# Patient Record
Sex: Male | Born: 1935 | Race: White | Hispanic: No | Marital: Single | Smoking: Current every day smoker
Health system: Southern US, Community
[De-identification: ages and names within clinical notes are randomized; demographics above are authoritative.]

## PROBLEM LIST (undated history)

## (undated) DIAGNOSIS — F0280 Dementia in other diseases classified elsewhere without behavioral disturbance: Secondary | ICD-10-CM

## (undated) DIAGNOSIS — I4891 Unspecified atrial fibrillation: Secondary | ICD-10-CM

## (undated) DIAGNOSIS — G309 Alzheimer's disease, unspecified: Secondary | ICD-10-CM

## (undated) DIAGNOSIS — B958 Unspecified staphylococcus as the cause of diseases classified elsewhere: Secondary | ICD-10-CM

## (undated) DIAGNOSIS — I10 Essential (primary) hypertension: Secondary | ICD-10-CM

## (undated) DIAGNOSIS — B029 Zoster without complications: Secondary | ICD-10-CM

## (undated) HISTORY — PX: UMBILICAL HERNIA REPAIR: SHX196

## (undated) HISTORY — DX: Essential (primary) hypertension: I10

## (undated) HISTORY — PX: INGUINAL HERNIA REPAIR: SUR1180

## (undated) HISTORY — DX: Unspecified staphylococcus as the cause of diseases classified elsewhere: B95.8

## (undated) HISTORY — DX: Zoster without complications: B02.9

## (undated) HISTORY — PX: TONSILLECTOMY: SUR1361

## (undated) HISTORY — PX: TIBIA FRACTURE SURGERY: SHX806

---

## 2005-04-21 ENCOUNTER — Ambulatory Visit: Payer: Self-pay | Admitting: Family Medicine

## 2006-05-31 ENCOUNTER — Ambulatory Visit: Payer: Self-pay | Admitting: Family Medicine

## 2007-09-19 ENCOUNTER — Encounter: Payer: Self-pay | Admitting: Cardiology

## 2007-12-16 ENCOUNTER — Encounter: Payer: Self-pay | Admitting: Cardiology

## 2008-10-08 ENCOUNTER — Encounter: Payer: Self-pay | Admitting: Cardiology

## 2008-10-09 ENCOUNTER — Encounter: Payer: Self-pay | Admitting: Cardiology

## 2008-10-14 ENCOUNTER — Encounter: Payer: Self-pay | Admitting: Cardiology

## 2008-11-26 ENCOUNTER — Encounter: Payer: Self-pay | Admitting: Cardiology

## 2008-12-11 ENCOUNTER — Ambulatory Visit: Payer: Self-pay | Admitting: Cardiology

## 2008-12-11 DIAGNOSIS — I10 Essential (primary) hypertension: Secondary | ICD-10-CM | POA: Insufficient documentation

## 2008-12-11 DIAGNOSIS — I491 Atrial premature depolarization: Secondary | ICD-10-CM

## 2008-12-11 DIAGNOSIS — R011 Cardiac murmur, unspecified: Secondary | ICD-10-CM

## 2009-01-12 ENCOUNTER — Telehealth (INDEPENDENT_AMBULATORY_CARE_PROVIDER_SITE_OTHER): Payer: Self-pay | Admitting: *Deleted

## 2011-10-07 ENCOUNTER — Encounter: Payer: Self-pay | Admitting: Cardiology

## 2012-07-11 ENCOUNTER — Encounter: Payer: Self-pay | Admitting: Cardiology

## 2012-08-23 ENCOUNTER — Encounter: Payer: Self-pay | Admitting: Cardiology

## 2012-08-23 ENCOUNTER — Ambulatory Visit (INDEPENDENT_AMBULATORY_CARE_PROVIDER_SITE_OTHER): Payer: Medicare Other | Admitting: Cardiology

## 2012-08-23 VITALS — BP 180/74 | HR 75 | Ht 67.0 in | Wt 143.0 lb

## 2012-08-23 DIAGNOSIS — I10 Essential (primary) hypertension: Secondary | ICD-10-CM

## 2012-08-23 DIAGNOSIS — R9431 Abnormal electrocardiogram [ECG] [EKG]: Secondary | ICD-10-CM | POA: Insufficient documentation

## 2012-08-23 DIAGNOSIS — I491 Atrial premature depolarization: Secondary | ICD-10-CM

## 2012-08-23 NOTE — Progress Notes (Signed)
Clinical Summary Mr. Snuffer is a 79 y.o.male referred back to the office by Ms. Hemberg NP with [facility] in [place] regarding history of atrial ectopy. He was last seen in the office back in September 2010.   He reports no specific symptoms of chest pain or shortness of breath. Has an occasional sense of dizziness and palpitations but no syncope. He is on fairly minimal medications. States that he takes "a teaspoon of vinegar" for blood pressure.  ECG from April of this year reviewed showing sinus rhythm with PACs, leftward axis rule out old inferior infarct pattern, also poor anterior R. progression suggestive of prior anterolateral infarct. Compared to old tracing from 2010, leftward axis is similar, as are PACs, however R wave progression has worsened significantly.  He has not had an echocardiogram, previously recommended to assess cardiac structure and function. We discussed this again today.  Allergies  Allergen Reactions  . Dristan Cold (Chlorphen-Pe-Acetaminophen) Nausea Only  . Sulfonamide Derivatives Nausea Only    Current Outpatient Prescriptions  Medication Sig Dispense Refill  . aspirin EC 81 MG tablet Take 81 mg by mouth daily.      Marland Kitchen acetaminophen (TYLENOL) 500 MG tablet Take 500 mg by mouth every 6 (six) hours as needed.       No current facility-administered medications for this visit.    Past Medical History  Diagnosis Date  . Essential hypertension, benign   . Herpes zoster   . Staphylococcal skin infection     Past Surgical History  Procedure Laterality Date  . Inguinal hernia repair    . Tibia fracture surgery      Open reduction and internal fixation of left tibia fracture  . Tonsillectomy    . Umbilical hernia repair      Family History  Problem Relation Age of Onset  . Cancer      Social History Mr. Blanford reports that he has been smoking Cigarettes.  He has a 31 pack-year smoking history. He has never used smokeless tobacco. Mr.  Elpers reports that  drinks alcohol.  Review of Systems No orthopnea or PND. No exertional chest pain. Stable appetite. Occasional anxious feeling. Otherwise negative.  Physical Examination Filed Vitals:   08/23/12 1458  BP: 180/74  Pulse: 75   Filed Weights   08/23/12 1458  Weight: 143 lb (64.864 kg)   No acute distress. HEENT: Conjunctiva and lids normal, oropharynx clear. Neck: Supple, no elevated JVP or carotid bruits, no thyromegaly. Lungs: Clear to auscultation, nonlabored breathing at rest. Cardiac: Regular rate and rhythm with frequent ectopic beats, no S3, soft systolic murmur, no pericardial rub. Abdomen: Soft, nontender, bowel sounds present. Extremities: No pitting edema, distal pulses 2+. Skin: Warm and dry. Musculoskeletal: No kyphosis. Neuropsychiatric: Alert and oriented x3, affect grossly appropriate.   Problem List and Plan   PREMATURE ATRIAL CONTRACTIONS Documented previously. Not entirely clear that these are overly symptomatic, although he does have occasional sense of palpitations. No syncope. No progressive symptoms. Recommended that he quit smoking, limit caffeine in his diet. He is not particularly interested in any specific medications.  Essential hypertension, benign Blood pressure elevated today, not on antihypertensives. He states that he will continue using "a teaspoon of vinegar" for his blood pressure. I recommended that he followup with his primary care provider, may need to consider additional agents.  Abnormal ECG Reviewed above. As recommended previously, an echocardiogram will be ordered to assess cardiac structure and function, exclude cardiomyopathy or other wall motion abnormalities  that might need further evaluation.    Jonelle Sidle, M.D., F.A.C.C.

## 2012-08-23 NOTE — Assessment & Plan Note (Signed)
Reviewed above. As recommended previously, an echocardiogram will be ordered to assess cardiac structure and function, exclude cardiomyopathy or other wall motion abnormalities that might need further evaluation.

## 2012-08-23 NOTE — Patient Instructions (Addendum)
Your physician has requested that you have an echocardiogram. Echocardiography is a painless test that uses sound waves to create images of your heart. It provides your doctor with information about the size and shape of your heart and how well your heart's chambers and valves are working. This procedure takes approximately one hour. There are no restrictions for this procedure. We will call you with your results.

## 2012-08-23 NOTE — Assessment & Plan Note (Signed)
Blood pressure elevated today, not on antihypertensives. He states that he will continue using "a teaspoon of vinegar" for his blood pressure. I recommended that he followup with his primary care provider, may need to consider additional agents.

## 2012-08-23 NOTE — Assessment & Plan Note (Signed)
Documented previously. Not entirely clear that these are overly symptomatic, although he does have occasional sense of palpitations. No syncope. No progressive symptoms. Recommended that he quit smoking, limit caffeine in his diet. He is not particularly interested in any specific medications.

## 2012-09-05 ENCOUNTER — Other Ambulatory Visit (INDEPENDENT_AMBULATORY_CARE_PROVIDER_SITE_OTHER): Payer: Medicare Other

## 2012-09-05 ENCOUNTER — Other Ambulatory Visit: Payer: Self-pay

## 2012-09-05 DIAGNOSIS — I10 Essential (primary) hypertension: Secondary | ICD-10-CM

## 2012-09-05 DIAGNOSIS — R9431 Abnormal electrocardiogram [ECG] [EKG]: Secondary | ICD-10-CM

## 2012-09-05 DIAGNOSIS — I491 Atrial premature depolarization: Secondary | ICD-10-CM

## 2012-09-07 ENCOUNTER — Telehealth: Payer: Self-pay | Admitting: *Deleted

## 2012-09-07 NOTE — Telephone Encounter (Signed)
Message copied by Eustace Whitner on Fri Sep 07, 2012 11:27 AM ------      Message from: MCDOWELL, Illene Bolus      Created: Wed Sep 05, 2012  3:54 PM       Reviewed. Let him know that overall LV systolic function is in the low normal range, no definite wall motion abnormalities to suggest scar, no clear evidence of cardiomyopathy. Small pericardial effusion, doubt major clinical significance. Aortic root and ascending aorta mildly enlarged. May need to reconsider medical therapy for hypertension, have him followup with his primary care provider. No other cardiac testing anticipated at this time. ------

## 2012-09-07 NOTE — Telephone Encounter (Signed)
Patient informed. Copy sent to PCP °

## 2014-03-19 ENCOUNTER — Ambulatory Visit (HOSPITAL_COMMUNITY)
Admission: RE | Admit: 2014-03-19 | Discharge: 2014-03-19 | Disposition: A | Discharge status: Home / Self Care | Payer: Medicare Other | Source: Ambulatory Visit | Attending: Adult Health Nurse Practitioner | Admitting: Adult Health Nurse Practitioner

## 2014-03-19 ENCOUNTER — Other Ambulatory Visit (HOSPITAL_COMMUNITY): Payer: Self-pay | Admitting: Adult Health Nurse Practitioner

## 2014-03-19 DIAGNOSIS — J069 Acute upper respiratory infection, unspecified: Secondary | ICD-10-CM | POA: Diagnosis present

## 2014-03-19 DIAGNOSIS — R042 Hemoptysis: Secondary | ICD-10-CM | POA: Diagnosis present

## 2014-03-19 DIAGNOSIS — F17210 Nicotine dependence, cigarettes, uncomplicated: Secondary | ICD-10-CM | POA: Diagnosis present

## 2014-03-19 DIAGNOSIS — J449 Chronic obstructive pulmonary disease, unspecified: Secondary | ICD-10-CM | POA: Insufficient documentation

## 2014-03-19 DIAGNOSIS — J189 Pneumonia, unspecified organism: Secondary | ICD-10-CM | POA: Diagnosis not present

## 2014-04-07 ENCOUNTER — Ambulatory Visit (HOSPITAL_COMMUNITY)
Admission: RE | Admit: 2014-04-07 | Discharge: 2014-04-07 | Disposition: A | Discharge status: Home / Self Care | Payer: Commercial Managed Care - HMO | Source: Ambulatory Visit | Attending: Adult Health Nurse Practitioner | Admitting: Adult Health Nurse Practitioner

## 2014-04-07 ENCOUNTER — Other Ambulatory Visit (HOSPITAL_COMMUNITY): Payer: Self-pay | Admitting: Adult Health Nurse Practitioner

## 2014-04-07 DIAGNOSIS — J189 Pneumonia, unspecified organism: Secondary | ICD-10-CM

## 2014-04-07 DIAGNOSIS — J449 Chronic obstructive pulmonary disease, unspecified: Secondary | ICD-10-CM | POA: Diagnosis not present

## 2014-04-07 DIAGNOSIS — Z09 Encounter for follow-up examination after completed treatment for conditions other than malignant neoplasm: Secondary | ICD-10-CM | POA: Diagnosis present

## 2014-04-30 ENCOUNTER — Other Ambulatory Visit (HOSPITAL_COMMUNITY): Payer: Self-pay | Admitting: Adult Health Nurse Practitioner

## 2014-04-30 ENCOUNTER — Ambulatory Visit (HOSPITAL_COMMUNITY)
Admission: RE | Admit: 2014-04-30 | Discharge: 2014-04-30 | Disposition: A | Discharge status: Home / Self Care | Payer: Commercial Managed Care - HMO | Source: Ambulatory Visit | Attending: Adult Health Nurse Practitioner | Admitting: Adult Health Nurse Practitioner

## 2014-04-30 DIAGNOSIS — J449 Chronic obstructive pulmonary disease, unspecified: Secondary | ICD-10-CM | POA: Diagnosis not present

## 2014-04-30 DIAGNOSIS — J690 Pneumonitis due to inhalation of food and vomit: Secondary | ICD-10-CM | POA: Diagnosis present

## 2014-04-30 DIAGNOSIS — J984 Other disorders of lung: Secondary | ICD-10-CM | POA: Diagnosis not present

## 2014-06-24 ENCOUNTER — Ambulatory Visit (HOSPITAL_COMMUNITY)
Admission: RE | Admit: 2014-06-24 | Discharge: 2014-06-24 | Disposition: A | Discharge status: Home / Self Care | Payer: Medicare HMO | Source: Ambulatory Visit | Attending: Adult Health Nurse Practitioner | Admitting: Adult Health Nurse Practitioner

## 2014-06-24 ENCOUNTER — Other Ambulatory Visit (HOSPITAL_COMMUNITY): Payer: Self-pay | Admitting: Adult Health Nurse Practitioner

## 2014-06-24 ENCOUNTER — Other Ambulatory Visit (HOSPITAL_COMMUNITY): Payer: Self-pay | Admitting: *Deleted

## 2014-06-24 DIAGNOSIS — J189 Pneumonia, unspecified organism: Secondary | ICD-10-CM

## 2014-06-24 DIAGNOSIS — R05 Cough: Secondary | ICD-10-CM | POA: Diagnosis present

## 2014-06-24 DIAGNOSIS — Z87891 Personal history of nicotine dependence: Secondary | ICD-10-CM | POA: Diagnosis not present

## 2016-10-16 ENCOUNTER — Encounter (HOSPITAL_COMMUNITY): Payer: Self-pay | Admitting: *Deleted

## 2016-10-16 ENCOUNTER — Inpatient Hospital Stay (HOSPITAL_COMMUNITY)
Admission: EM | Admit: 2016-10-16 | Discharge: 2016-10-26 | DRG: 871 | Disposition: A | Discharge status: Skilled Nursing Facility | Payer: Medicare HMO | Attending: Internal Medicine | Admitting: Internal Medicine

## 2016-10-16 ENCOUNTER — Emergency Department (HOSPITAL_COMMUNITY): Payer: Medicare HMO

## 2016-10-16 DIAGNOSIS — R6520 Severe sepsis without septic shock: Secondary | ICD-10-CM | POA: Diagnosis present

## 2016-10-16 DIAGNOSIS — J690 Pneumonitis due to inhalation of food and vomit: Secondary | ICD-10-CM

## 2016-10-16 DIAGNOSIS — I129 Hypertensive chronic kidney disease with stage 1 through stage 4 chronic kidney disease, or unspecified chronic kidney disease: Secondary | ICD-10-CM | POA: Diagnosis present

## 2016-10-16 DIAGNOSIS — I4891 Unspecified atrial fibrillation: Secondary | ICD-10-CM | POA: Diagnosis present

## 2016-10-16 DIAGNOSIS — Z7189 Other specified counseling: Secondary | ICD-10-CM

## 2016-10-16 DIAGNOSIS — Z515 Encounter for palliative care: Secondary | ICD-10-CM

## 2016-10-16 DIAGNOSIS — G9341 Metabolic encephalopathy: Secondary | ICD-10-CM

## 2016-10-16 DIAGNOSIS — R0902 Hypoxemia: Secondary | ICD-10-CM

## 2016-10-16 DIAGNOSIS — B9562 Methicillin resistant Staphylococcus aureus infection as the cause of diseases classified elsewhere: Secondary | ICD-10-CM

## 2016-10-16 DIAGNOSIS — R7881 Bacteremia: Secondary | ICD-10-CM

## 2016-10-16 DIAGNOSIS — E538 Deficiency of other specified B group vitamins: Secondary | ICD-10-CM | POA: Diagnosis present

## 2016-10-16 DIAGNOSIS — J189 Pneumonia, unspecified organism: Secondary | ICD-10-CM

## 2016-10-16 DIAGNOSIS — I38 Endocarditis, valve unspecified: Secondary | ICD-10-CM

## 2016-10-16 DIAGNOSIS — A4102 Sepsis due to Methicillin resistant Staphylococcus aureus: Secondary | ICD-10-CM | POA: Diagnosis not present

## 2016-10-16 DIAGNOSIS — N183 Chronic kidney disease, stage 3 unspecified: Secondary | ICD-10-CM

## 2016-10-16 DIAGNOSIS — F0280 Dementia in other diseases classified elsewhere without behavioral disturbance: Secondary | ICD-10-CM | POA: Diagnosis present

## 2016-10-16 DIAGNOSIS — Z452 Encounter for adjustment and management of vascular access device: Secondary | ICD-10-CM

## 2016-10-16 DIAGNOSIS — J9811 Atelectasis: Secondary | ICD-10-CM | POA: Diagnosis present

## 2016-10-16 DIAGNOSIS — R509 Fever, unspecified: Secondary | ICD-10-CM

## 2016-10-16 DIAGNOSIS — I10 Essential (primary) hypertension: Secondary | ICD-10-CM | POA: Diagnosis present

## 2016-10-16 DIAGNOSIS — G309 Alzheimer's disease, unspecified: Secondary | ICD-10-CM | POA: Diagnosis present

## 2016-10-16 DIAGNOSIS — N179 Acute kidney failure, unspecified: Secondary | ICD-10-CM

## 2016-10-16 DIAGNOSIS — D6959 Other secondary thrombocytopenia: Secondary | ICD-10-CM | POA: Diagnosis present

## 2016-10-16 DIAGNOSIS — E8770 Fluid overload, unspecified: Secondary | ICD-10-CM | POA: Diagnosis present

## 2016-10-16 DIAGNOSIS — E876 Hypokalemia: Secondary | ICD-10-CM | POA: Diagnosis present

## 2016-10-16 DIAGNOSIS — J90 Pleural effusion, not elsewhere classified: Secondary | ICD-10-CM | POA: Diagnosis present

## 2016-10-16 DIAGNOSIS — R5081 Fever presenting with conditions classified elsewhere: Secondary | ICD-10-CM

## 2016-10-16 DIAGNOSIS — Z7982 Long term (current) use of aspirin: Secondary | ICD-10-CM

## 2016-10-16 DIAGNOSIS — E869 Volume depletion, unspecified: Secondary | ICD-10-CM | POA: Diagnosis present

## 2016-10-16 DIAGNOSIS — I248 Other forms of acute ischemic heart disease: Secondary | ICD-10-CM | POA: Diagnosis present

## 2016-10-16 DIAGNOSIS — Z79899 Other long term (current) drug therapy: Secondary | ICD-10-CM

## 2016-10-16 DIAGNOSIS — J9601 Acute respiratory failure with hypoxia: Secondary | ICD-10-CM

## 2016-10-16 DIAGNOSIS — F17210 Nicotine dependence, cigarettes, uncomplicated: Secondary | ICD-10-CM | POA: Diagnosis present

## 2016-10-16 DIAGNOSIS — F0390 Unspecified dementia without behavioral disturbance: Secondary | ICD-10-CM

## 2016-10-16 HISTORY — DX: Unspecified atrial fibrillation: I48.91

## 2016-10-16 HISTORY — DX: Dementia in other diseases classified elsewhere, unspecified severity, without behavioral disturbance, psychotic disturbance, mood disturbance, and anxiety: F02.80

## 2016-10-16 HISTORY — DX: Alzheimer's disease, unspecified: G30.9

## 2016-10-16 LAB — URINALYSIS, ROUTINE W REFLEX MICROSCOPIC
BACTERIA UA: NONE SEEN
Bilirubin Urine: NEGATIVE
Glucose, UA: NEGATIVE mg/dL
HGB URINE DIPSTICK: NEGATIVE
Ketones, ur: NEGATIVE mg/dL
LEUKOCYTES UA: NEGATIVE
NITRITE: NEGATIVE
PROTEIN: 30 mg/dL — AB
Specific Gravity, Urine: 1.018 (ref 1.005–1.030)
pH: 7 (ref 5.0–8.0)

## 2016-10-16 LAB — DIFFERENTIAL
Basophils Absolute: 0 10*3/uL (ref 0.0–0.1)
Basophils Relative: 0 %
Eosinophils Absolute: 0 10*3/uL (ref 0.0–0.7)
Eosinophils Relative: 0 %
Lymphocytes Relative: 3 %
Lymphs Abs: 0.5 10*3/uL — ABNORMAL LOW (ref 0.7–4.0)
MONO ABS: 1.1 10*3/uL — AB (ref 0.1–1.0)
MONOS PCT: 5 %
Neutro Abs: 18.1 10*3/uL — ABNORMAL HIGH (ref 1.7–7.7)
Neutrophils Relative %: 92 %

## 2016-10-16 LAB — CBC
HEMATOCRIT: 42.2 % (ref 39.0–52.0)
HEMOGLOBIN: 13.8 g/dL (ref 13.0–17.0)
MCH: 31.7 pg (ref 26.0–34.0)
MCHC: 32.7 g/dL (ref 30.0–36.0)
MCV: 97 fL (ref 78.0–100.0)
Platelets: 125 10*3/uL — ABNORMAL LOW (ref 150–400)
RBC: 4.35 MIL/uL (ref 4.22–5.81)
RDW: 13.2 % (ref 11.5–15.5)
WBC: 20.5 10*3/uL — AB (ref 4.0–10.5)

## 2016-10-16 LAB — HEPATIC FUNCTION PANEL
ALT: 34 U/L (ref 17–63)
AST: 43 U/L — ABNORMAL HIGH (ref 15–41)
Albumin: 3.4 g/dL — ABNORMAL LOW (ref 3.5–5.0)
Alkaline Phosphatase: 70 U/L (ref 38–126)
BILIRUBIN DIRECT: 0.3 mg/dL (ref 0.1–0.5)
Indirect Bilirubin: 1.1 mg/dL — ABNORMAL HIGH (ref 0.3–0.9)
Total Bilirubin: 1.4 mg/dL — ABNORMAL HIGH (ref 0.3–1.2)
Total Protein: 6.2 g/dL — ABNORMAL LOW (ref 6.5–8.1)

## 2016-10-16 LAB — BASIC METABOLIC PANEL
ANION GAP: 9 (ref 5–15)
BUN: 16 mg/dL (ref 6–20)
CHLORIDE: 101 mmol/L (ref 101–111)
CO2: 28 mmol/L (ref 22–32)
Calcium: 8.9 mg/dL (ref 8.9–10.3)
Creatinine, Ser: 1.54 mg/dL — ABNORMAL HIGH (ref 0.61–1.24)
GFR calc Af Amer: 47 mL/min — ABNORMAL LOW (ref >60)
GFR, EST NON AFRICAN AMERICAN: 41 mL/min — AB (ref >60)
Glucose, Bld: 107 mg/dL — ABNORMAL HIGH (ref 65–99)
Potassium: 4.4 mmol/L (ref 3.5–5.1)
Sodium: 138 mmol/L (ref 135–145)

## 2016-10-16 LAB — TROPONIN I: Troponin I: 0.06 ng/mL (ref <0.03)

## 2016-10-16 LAB — CK: Total CK: 38 U/L — ABNORMAL LOW (ref 49–397)

## 2016-10-16 MED ORDER — DEXTROSE 5 % IV SOLN
1.0000 g | Freq: Once | INTRAVENOUS | Status: AC
  Administered 2016-10-17: 1 g via INTRAVENOUS
  Filled 2016-10-16: qty 10

## 2016-10-16 MED ORDER — SODIUM CHLORIDE 0.9 % IV BOLUS (SEPSIS)
1000.0000 mL | Freq: Once | INTRAVENOUS | Status: AC
  Administered 2016-10-17: 1000 mL via INTRAVENOUS

## 2016-10-16 MED ORDER — AZITHROMYCIN 500 MG IV SOLR
500.0000 mg | Freq: Once | INTRAVENOUS | Status: AC
  Administered 2016-10-17: 500 mg via INTRAVENOUS
  Filled 2016-10-16: qty 500

## 2016-10-16 NOTE — ED Triage Notes (Addendum)
Pt arrived to er after family called them due to pt being outside for over 5 hours today and was having altered mental status and weakness, room air pulse ox was upper 70's upon ems arrival, pt arrived to er on 100% NRB,

## 2016-10-16 NOTE — ED Notes (Addendum)
CRITICAL VALUE ALERT  Critical Value:  Troponin 0.06 Date & Time Notied:  10/16/16@2320  Provider Notified: Lynelle DoctorKnapp Orders Received/Actions taken: none yet

## 2016-10-17 ENCOUNTER — Inpatient Hospital Stay (HOSPITAL_COMMUNITY): Payer: Medicare HMO

## 2016-10-17 ENCOUNTER — Encounter (HOSPITAL_COMMUNITY): Payer: Self-pay | Admitting: Internal Medicine

## 2016-10-17 DIAGNOSIS — R7989 Other specified abnormal findings of blood chemistry: Secondary | ICD-10-CM | POA: Diagnosis not present

## 2016-10-17 DIAGNOSIS — Z79899 Other long term (current) drug therapy: Secondary | ICD-10-CM | POA: Diagnosis not present

## 2016-10-17 DIAGNOSIS — A419 Sepsis, unspecified organism: Secondary | ICD-10-CM | POA: Diagnosis not present

## 2016-10-17 DIAGNOSIS — Z8679 Personal history of other diseases of the circulatory system: Secondary | ICD-10-CM

## 2016-10-17 DIAGNOSIS — A4902 Methicillin resistant Staphylococcus aureus infection, unspecified site: Secondary | ICD-10-CM | POA: Diagnosis not present

## 2016-10-17 DIAGNOSIS — Z7189 Other specified counseling: Secondary | ICD-10-CM | POA: Diagnosis not present

## 2016-10-17 DIAGNOSIS — R509 Fever, unspecified: Secondary | ICD-10-CM | POA: Diagnosis present

## 2016-10-17 DIAGNOSIS — R6520 Severe sepsis without septic shock: Secondary | ICD-10-CM | POA: Diagnosis present

## 2016-10-17 DIAGNOSIS — N183 Chronic kidney disease, stage 3 (moderate): Secondary | ICD-10-CM | POA: Diagnosis present

## 2016-10-17 DIAGNOSIS — I10 Essential (primary) hypertension: Secondary | ICD-10-CM | POA: Diagnosis not present

## 2016-10-17 DIAGNOSIS — I351 Nonrheumatic aortic (valve) insufficiency: Secondary | ICD-10-CM

## 2016-10-17 DIAGNOSIS — F0390 Unspecified dementia without behavioral disturbance: Secondary | ICD-10-CM | POA: Diagnosis not present

## 2016-10-17 DIAGNOSIS — E538 Deficiency of other specified B group vitamins: Secondary | ICD-10-CM | POA: Diagnosis present

## 2016-10-17 DIAGNOSIS — D519 Vitamin B12 deficiency anemia, unspecified: Secondary | ICD-10-CM | POA: Diagnosis not present

## 2016-10-17 DIAGNOSIS — E876 Hypokalemia: Secondary | ICD-10-CM | POA: Diagnosis present

## 2016-10-17 DIAGNOSIS — Z7982 Long term (current) use of aspirin: Secondary | ICD-10-CM | POA: Diagnosis not present

## 2016-10-17 DIAGNOSIS — R7881 Bacteremia: Secondary | ICD-10-CM | POA: Diagnosis not present

## 2016-10-17 DIAGNOSIS — R6510 Systemic inflammatory response syndrome (SIRS) of non-infectious origin without acute organ dysfunction: Secondary | ICD-10-CM | POA: Diagnosis not present

## 2016-10-17 DIAGNOSIS — J690 Pneumonitis due to inhalation of food and vomit: Secondary | ICD-10-CM | POA: Diagnosis present

## 2016-10-17 DIAGNOSIS — Z515 Encounter for palliative care: Secondary | ICD-10-CM | POA: Diagnosis not present

## 2016-10-17 DIAGNOSIS — I129 Hypertensive chronic kidney disease with stage 1 through stage 4 chronic kidney disease, or unspecified chronic kidney disease: Secondary | ICD-10-CM | POA: Diagnosis present

## 2016-10-17 DIAGNOSIS — G309 Alzheimer's disease, unspecified: Secondary | ICD-10-CM | POA: Diagnosis present

## 2016-10-17 DIAGNOSIS — G9341 Metabolic encephalopathy: Secondary | ICD-10-CM | POA: Diagnosis present

## 2016-10-17 DIAGNOSIS — I248 Other forms of acute ischemic heart disease: Secondary | ICD-10-CM | POA: Diagnosis present

## 2016-10-17 DIAGNOSIS — J9811 Atelectasis: Secondary | ICD-10-CM | POA: Diagnosis present

## 2016-10-17 DIAGNOSIS — A4102 Sepsis due to Methicillin resistant Staphylococcus aureus: Secondary | ICD-10-CM | POA: Diagnosis present

## 2016-10-17 DIAGNOSIS — E869 Volume depletion, unspecified: Secondary | ICD-10-CM | POA: Diagnosis present

## 2016-10-17 DIAGNOSIS — I38 Endocarditis, valve unspecified: Secondary | ICD-10-CM | POA: Diagnosis present

## 2016-10-17 DIAGNOSIS — F0280 Dementia in other diseases classified elsewhere without behavioral disturbance: Secondary | ICD-10-CM | POA: Diagnosis present

## 2016-10-17 DIAGNOSIS — D6959 Other secondary thrombocytopenia: Secondary | ICD-10-CM | POA: Diagnosis present

## 2016-10-17 DIAGNOSIS — E8770 Fluid overload, unspecified: Secondary | ICD-10-CM | POA: Diagnosis present

## 2016-10-17 DIAGNOSIS — F17210 Nicotine dependence, cigarettes, uncomplicated: Secondary | ICD-10-CM | POA: Diagnosis present

## 2016-10-17 DIAGNOSIS — N179 Acute kidney failure, unspecified: Secondary | ICD-10-CM | POA: Diagnosis present

## 2016-10-17 DIAGNOSIS — I4891 Unspecified atrial fibrillation: Secondary | ICD-10-CM | POA: Diagnosis present

## 2016-10-17 DIAGNOSIS — J9601 Acute respiratory failure with hypoxia: Secondary | ICD-10-CM | POA: Diagnosis present

## 2016-10-17 DIAGNOSIS — J90 Pleural effusion, not elsewhere classified: Secondary | ICD-10-CM | POA: Diagnosis present

## 2016-10-17 LAB — TSH: TSH: 0.937 u[IU]/mL (ref 0.350–4.500)

## 2016-10-17 LAB — CBC WITH DIFFERENTIAL/PLATELET
Basophils Absolute: 0 10*3/uL (ref 0.0–0.1)
Basophils Relative: 0 %
Eosinophils Absolute: 0 10*3/uL (ref 0.0–0.7)
Eosinophils Relative: 0 %
HEMATOCRIT: 39.9 % (ref 39.0–52.0)
Hemoglobin: 13.3 g/dL (ref 13.0–17.0)
LYMPHS ABS: 0.5 10*3/uL — AB (ref 0.7–4.0)
LYMPHS PCT: 4 %
MCH: 32.1 pg (ref 26.0–34.0)
MCHC: 33.3 g/dL (ref 30.0–36.0)
MCV: 96.4 fL (ref 78.0–100.0)
MONO ABS: 0.3 10*3/uL (ref 0.1–1.0)
MONOS PCT: 2 %
NEUTROS ABS: 12 10*3/uL — AB (ref 1.7–7.7)
Neutrophils Relative %: 94 %
Platelets: 96 10*3/uL — ABNORMAL LOW (ref 150–400)
RBC: 4.14 MIL/uL — ABNORMAL LOW (ref 4.22–5.81)
RDW: 13.3 % (ref 11.5–15.5)
WBC: 12.8 10*3/uL — ABNORMAL HIGH (ref 4.0–10.5)

## 2016-10-17 LAB — IRON AND TIBC
IRON: 12 ug/dL — AB (ref 45–182)
Saturation Ratios: 5 % — ABNORMAL LOW (ref 17.9–39.5)
TIBC: 237 ug/dL — AB (ref 250–450)
UIBC: 225 ug/dL

## 2016-10-17 LAB — COMPREHENSIVE METABOLIC PANEL
ALBUMIN: 3.1 g/dL — AB (ref 3.5–5.0)
ALK PHOS: 61 U/L (ref 38–126)
ALT: 35 U/L (ref 17–63)
ANION GAP: 7 (ref 5–15)
AST: 44 U/L — ABNORMAL HIGH (ref 15–41)
BUN: 17 mg/dL (ref 6–20)
CALCIUM: 8.1 mg/dL — AB (ref 8.9–10.3)
CHLORIDE: 103 mmol/L (ref 101–111)
CO2: 24 mmol/L (ref 22–32)
Creatinine, Ser: 1.41 mg/dL — ABNORMAL HIGH (ref 0.61–1.24)
GFR calc Af Amer: 52 mL/min — ABNORMAL LOW (ref >60)
GFR calc non Af Amer: 45 mL/min — ABNORMAL LOW (ref >60)
GLUCOSE: 93 mg/dL (ref 65–99)
POTASSIUM: 3.9 mmol/L (ref 3.5–5.1)
SODIUM: 134 mmol/L — AB (ref 135–145)
Total Bilirubin: 1.3 mg/dL — ABNORMAL HIGH (ref 0.3–1.2)
Total Protein: 5.6 g/dL — ABNORMAL LOW (ref 6.5–8.1)

## 2016-10-17 LAB — TROPONIN I
TROPONIN I: 0.07 ng/mL — AB (ref <0.03)
Troponin I: 0.14 ng/mL (ref <0.03)
Troponin I: 0.21 ng/mL (ref <0.03)
Troponin I: 0.55 ng/mL (ref <0.03)

## 2016-10-17 LAB — CK: Total CK: 64 U/L (ref 49–397)

## 2016-10-17 LAB — BLOOD CULTURE ID PANEL (REFLEXED)
ACINETOBACTER BAUMANNII: NOT DETECTED
CANDIDA PARAPSILOSIS: NOT DETECTED
CANDIDA TROPICALIS: NOT DETECTED
Candida albicans: NOT DETECTED
Candida glabrata: NOT DETECTED
Candida krusei: NOT DETECTED
Enterobacter cloacae complex: NOT DETECTED
Enterobacteriaceae species: NOT DETECTED
Enterococcus species: NOT DETECTED
Escherichia coli: NOT DETECTED
HAEMOPHILUS INFLUENZAE: NOT DETECTED
KLEBSIELLA OXYTOCA: NOT DETECTED
Klebsiella pneumoniae: NOT DETECTED
Listeria monocytogenes: NOT DETECTED
METHICILLIN RESISTANCE: DETECTED — AB
Neisseria meningitidis: NOT DETECTED
PSEUDOMONAS AERUGINOSA: NOT DETECTED
Proteus species: NOT DETECTED
SERRATIA MARCESCENS: NOT DETECTED
STAPHYLOCOCCUS AUREUS BCID: DETECTED — AB
STAPHYLOCOCCUS SPECIES: DETECTED — AB
STREPTOCOCCUS PNEUMONIAE: NOT DETECTED
Streptococcus agalactiae: NOT DETECTED
Streptococcus pyogenes: NOT DETECTED
Streptococcus species: NOT DETECTED

## 2016-10-17 LAB — RETICULOCYTES
RBC.: 4.2 MIL/uL — ABNORMAL LOW (ref 4.22–5.81)
Retic Count, Absolute: 42 10*3/uL (ref 19.0–186.0)
Retic Ct Pct: 1 % (ref 0.4–3.1)

## 2016-10-17 LAB — FOLATE: Folate: 8.1 ng/mL (ref >5.9)

## 2016-10-17 LAB — FERRITIN: FERRITIN: 170 ng/mL (ref 24–336)

## 2016-10-17 LAB — LACTIC ACID, PLASMA
Lactic Acid, Venous: 1.4 mmol/L (ref 0.5–1.9)
Lactic Acid, Venous: 1.7 mmol/L (ref 0.5–1.9)

## 2016-10-17 LAB — VITAMIN B12: Vitamin B-12: 809 pg/mL (ref 180–914)

## 2016-10-17 LAB — ECHOCARDIOGRAM COMPLETE
Height: 66 in
Weight: 2230.4 oz

## 2016-10-17 LAB — STREP PNEUMONIAE URINARY ANTIGEN: Strep Pneumo Urinary Antigen: NEGATIVE

## 2016-10-17 MED ORDER — VANCOMYCIN HCL IN DEXTROSE 1-5 GM/200ML-% IV SOLN
1000.0000 mg | INTRAVENOUS | Status: AC
  Administered 2016-10-18 – 2016-10-25 (×7): 1000 mg via INTRAVENOUS
  Filled 2016-10-17 (×7): qty 200

## 2016-10-17 MED ORDER — CHLORHEXIDINE GLUCONATE 0.12 % MT SOLN
15.0000 mL | Freq: Two times a day (BID) | OROMUCOSAL | Status: DC
  Administered 2016-10-17 – 2016-10-26 (×16): 15 mL via OROMUCOSAL
  Filled 2016-10-17 (×16): qty 15

## 2016-10-17 MED ORDER — ORAL CARE MOUTH RINSE
15.0000 mL | Freq: Two times a day (BID) | OROMUCOSAL | Status: DC
  Administered 2016-10-20 – 2016-10-24 (×6): 15 mL via OROMUCOSAL

## 2016-10-17 MED ORDER — DEXTROSE 5 % IV SOLN
INTRAVENOUS | Status: AC
  Filled 2016-10-17: qty 10

## 2016-10-17 MED ORDER — SODIUM CHLORIDE 0.9 % IV SOLN
INTRAVENOUS | Status: AC
  Administered 2016-10-17 (×3): via INTRAVENOUS

## 2016-10-17 MED ORDER — DICLOFENAC SODIUM 1 % TD GEL
TRANSDERMAL | Status: AC
  Filled 2016-10-17: qty 100

## 2016-10-17 MED ORDER — DEXTROSE 5 % IV SOLN
2.0000 g | INTRAVENOUS | Status: DC
  Administered 2016-10-17 – 2016-10-18 (×2): 2 g via INTRAVENOUS
  Filled 2016-10-17 (×2): qty 2

## 2016-10-17 MED ORDER — ACETAMINOPHEN 650 MG RE SUPP
650.0000 mg | Freq: Four times a day (QID) | RECTAL | Status: DC | PRN
  Filled 2016-10-17: qty 1

## 2016-10-17 MED ORDER — VANCOMYCIN HCL 10 G IV SOLR
1500.0000 mg | Freq: Once | INTRAVENOUS | Status: AC
  Administered 2016-10-17: 1500 mg via INTRAVENOUS
  Filled 2016-10-17: qty 1500

## 2016-10-17 MED ORDER — ACETAMINOPHEN 325 MG PO TABS
650.0000 mg | ORAL_TABLET | Freq: Four times a day (QID) | ORAL | Status: DC | PRN
  Administered 2016-10-17 – 2016-10-26 (×6): 650 mg via ORAL
  Filled 2016-10-17 (×7): qty 2

## 2016-10-17 MED ORDER — ASPIRIN EC 81 MG PO TBEC
81.0000 mg | DELAYED_RELEASE_TABLET | Freq: Every day | ORAL | Status: DC
  Administered 2016-10-17 – 2016-10-26 (×9): 81 mg via ORAL
  Filled 2016-10-17 (×9): qty 1

## 2016-10-17 MED ORDER — DEXTROSE 5 % IV SOLN
500.0000 mg | INTRAVENOUS | Status: DC
  Administered 2016-10-17: 500 mg via INTRAVENOUS
  Filled 2016-10-17 (×2): qty 500

## 2016-10-17 MED ORDER — ACETAMINOPHEN 325 MG PO TABS
650.0000 mg | ORAL_TABLET | Freq: Once | ORAL | Status: AC
  Administered 2016-10-17: 650 mg via ORAL
  Filled 2016-10-17: qty 2

## 2016-10-17 NOTE — Progress Notes (Addendum)
CRITICAL VALUE ALERT  Critical Value:  Blood cultures positive for MRSA.   Date & Time Notied:  10/17/16 @ 18:48  Provider Notified: Dr. Ardyth HarpsHernandez  Orders Received/Actions taken: awaiting further orders. Pt already on IV vancomycin.

## 2016-10-17 NOTE — Progress Notes (Signed)
Paged Dr. Ardyth HarpsHernandez about abnormal EKG.

## 2016-10-17 NOTE — Progress Notes (Signed)
*  PRELIMINARY RESULTS* Echocardiogram 2D Echocardiogram has been performed.  Jon Cross, Jon Cross J 10/17/2016, 3:04 PM

## 2016-10-17 NOTE — Progress Notes (Signed)
Patient seen and examined, database reviewed. No family members at bedside. He was admitted earlier today due to confusion and fever. He was observed to have a productive cough, however two subsequent chest x-rays have been negative for respiratory source, urine analysis is negative for UTI. Subsequently 2 out of 2 blood cultures have resulted positive for gram-positive cocci. As we do not have a source I feel it is necessary to do an LP to investigate possible meningitis. He is already on Rocephin, will add vancomycin. He remains somnolent today. We'll need to discuss CODE STATUS and goals of care with patient's family.  Peggye PittEstela Hernandez, MD [facility] Pager: [phone]

## 2016-10-17 NOTE — ED Notes (Signed)
Notified pt's family member Editor, commissioning(Cathy Naeve) that pt was being admitted to room 339.

## 2016-10-17 NOTE — Progress Notes (Addendum)
CRITICAL VALUE ALERT  Critical Value:  Gram + cocci in all blood culture bottles and both sets.   Date & Time Notied:  10/17/2016 @ 12:43 pm  Provider Notified: Dr. Ardyth HarpsHernandez  Orders Received/Actions taken: Dr. Lenna Gilfordrdered IV vancomycin @ 12:46

## 2016-10-17 NOTE — ED Notes (Signed)
Pt with 700ml of urine in bladder and temp of 103 orally. Notified Dr. Lynelle DoctorKnapp and paged Dr. Coralie CommonKrackerkandy twice to inform him of this.

## 2016-10-17 NOTE — H&P (Addendum)
History and Physical    Jon CraneGeorge W Cross ZOX:[id number] DOB: 10-16-1932 DOA: 10/16/2016  PCP: Joette CatchingNyland, Leonard, MD  Patient coming from: Home.  Chief Complaint: Weakness.  HPI: Jon CraneGeorge W Haliburton is a 83 y.o. male with history of dementia, chronic kidney disease stage II A. fib as per the charts was brought to the ER. Patient to follow very weak and mildly confused. As per the report patient has been working in his yard today and went [place] his [place] park when patient was found to be weak and confused. Patient has not complained of any chest pain or shortness of breath nausea vomiting or diarrhea. Has been having persistent cough.  ED Course: In the ER patient is being found to have productive cough is oriented to his name only. Chest x-ray and UA are unremarkable. Patient was tachycardic and febrile with temperatures around 103F. No signs of meningitis. Patient had blood cultures drawn and since patient was having productive cough and began antibiotics for pneumonia were started. Troponin is found to be very elevated. EKG shows sinus tachycardia. CT of the head was unremarkable.  Review of Systems: As per HPI, rest all negative.   Past Medical History:  Diagnosis Date  . Alzheimer disease   . Atrial fibrillation (HCC)   . Essential hypertension, benign   . Herpes zoster   . Staphylococcal skin infection     Past Surgical History:  Procedure Laterality Date  . INGUINAL HERNIA REPAIR    . TIBIA FRACTURE SURGERY     Open reduction and internal fixation of left tibia fracture  . TONSILLECTOMY    . UMBILICAL HERNIA REPAIR       reports that he has been smoking Cigarettes.  He has a 31.00 pack-year smoking history. He has never used smokeless tobacco. He reports that he drinks alcohol. He reports that he does not use drugs.  Allergies  Allergen Reactions  . Dristan Cold [Chlorphen-Pe-Acetaminophen] Nausea Only  . Sulfonamide Derivatives Nausea Only    Family History  Problem  Relation Age of Onset  . Cancer Unknown     Prior to Admission medications   Medication Sig Start Date End Date Taking? Authorizing Provider  acetaminophen (TYLENOL) 500 MG tablet Take 500 mg by mouth every 6 (six) hours as needed.    [provider]  aspirin EC 81 MG tablet Take 81 mg by mouth daily.    [provider]    Physical Exam: Vitals:   10/17/16 0245 10/17/16 0321 10/17/16 0330 10/17/16 0408  BP:   (!) 172/71 132/64  Pulse: 85   (!) 115  Resp:    (!) 22  Temp:  (!) 103.1 F (39.5 C)  (!) 103.2 F (39.6 C)  TempSrc:  Oral  Oral  SpO2: 96%   96%  Weight:    63.2 kg (139 lb 6.4 oz)  Height:          Constitutional: Moderately built and nourished. Vitals:   10/17/16 0245 10/17/16 0321 10/17/16 0330 10/17/16 0408  BP:   (!) 172/71 132/64  Pulse: 85   (!) 115  Resp:    (!) 22  Temp:  (!) 103.1 F (39.5 C)  (!) 103.2 F (39.6 C)  TempSrc:  Oral  Oral  SpO2: 96%   96%  Weight:    63.2 kg (139 lb 6.4 oz)  Height:       Eyes: Anicteric no pallor. ENMT: No discharge from the ears eyes nose and mouth. Neck: No  neck rigidity no mass felt. No JVD appreciated. Respiratory: No rhonchi no palpitations. Cardiovascular: S1-S2. Tachycardic. Abdomen: Soft nontender bowel sounds present. No guarding or rigidity. Musculoskeletal: No edema. No joint effusion. Skin: No rash or skin appears warm. Neurologic: Alert awake oriented to his name. Follows commands. Moves all extremities. Psychiatric: Oriented to name.   Labs on Admission: I have personally reviewed following labs and imaging studies  CBC:  Recent Labs Lab 10/16/16 2212  WBC 20.5*  NEUTROABS 18.1*  HGB 13.8  HCT 42.2  MCV 97.0  PLT 125*   Basic Metabolic Panel:  Recent Labs Lab 10/16/16 2212  NA 138  K 4.4  CL 101  CO2 28  GLUCOSE 107*  BUN 16  CREATININE 1.54*  CALCIUM 8.9   GFR: Estimated Creatinine Clearance: 33.6 mL/min (A) (by C-G formula based on SCr of 1.54 mg/dL  (H)). Liver Function Tests:  Recent Labs Lab 10/16/16 2212  AST 43*  ALT 34  ALKPHOS 70  BILITOT 1.4*  PROT 6.2*  ALBUMIN 3.4*   No results for input(s): LIPASE, AMYLASE in the last 168 hours. No results for input(s): AMMONIA in the last 168 hours. Coagulation Profile: No results for input(s): INR, PROTIME in the last 168 hours. Cardiac Enzymes:  Recent Labs Lab 10/16/16 2212 10/17/16 0049  CKTOTAL 38*  --   TROPONINI 0.06* 0.07*   BNP (last 3 results) No results for input(s): PROBNP in the last 8760 hours. HbA1C: No results for input(s): HGBA1C in the last 72 hours. CBG: No results for input(s): GLUCAP in the last 168 hours. Lipid Profile: No results for input(s): CHOL, HDL, LDLCALC, TRIG, CHOLHDL, LDLDIRECT in the last 72 hours. Thyroid Function Tests: No results for input(s): TSH, T4TOTAL, FREET4, T3FREE, THYROIDAB in the last 72 hours. Anemia Panel: No results for input(s): VITAMINB12, FOLATE, FERRITIN, TIBC, IRON, RETICCTPCT in the last 72 hours. Urine analysis:    Component Value Date/Time   COLORURINE YELLOW 10/16/2016 2313   APPEARANCEUR CLEAR 10/16/2016 2313   LABSPEC 1.018 10/16/2016 2313   PHURINE 7.0 10/16/2016 2313   GLUCOSEU NEGATIVE 10/16/2016 2313   HGBUR NEGATIVE 10/16/2016 2313   BILIRUBINUR NEGATIVE 10/16/2016 2313   KETONESUR NEGATIVE 10/16/2016 2313   PROTEINUR 30 (A) 10/16/2016 2313   NITRITE NEGATIVE 10/16/2016 2313   LEUKOCYTESUR NEGATIVE 10/16/2016 2313   Sepsis Labs: @LABRCNTIP (procalcitonin:4,lacticidven:4) ) Recent Results (from the past 240 hour(s))  Blood Culture (routine x 2)     Status: None (Preliminary result)   Collection Time: 10/17/16 12:37 AM  Result Value Ref Range Status   Specimen Description BLOOD RIGHT ARM  Final   Special Requests   Final    BOTTLES DRAWN AEROBIC AND ANAEROBIC Blood Culture adequate volume   Culture PENDING  Incomplete   Report Status PENDING  Incomplete  Blood Culture (routine x 2)      Status: None (Preliminary result)   Collection Time: 10/17/16 12:46 AM  Result Value Ref Range Status   Specimen Description BLOOD LEFT ARM  Final   Special Requests   Final    BOTTLES DRAWN AEROBIC ONLY Blood Culture results may not be optimal due to an inadequate volume of blood received in culture bottles   Culture PENDING  Incomplete   Report Status PENDING  Incomplete     Radiological Exams on Admission: Dg Chest 2 View  Result Date: 10/16/2016 CLINICAL DATA:  Altered mental status and weakness after extended time outside. EXAM: CHEST  2 VIEW COMPARISON:  Chest radiograph June 24, 2014  FINDINGS: Cardiac silhouette is moderately enlarged. Calcified aortic knob. Similar mild chronic interstitial changes without pleural effusion or focal consolidation. Increased lung volumes. No pneumothorax. Accentuated kyphosis, chronic approximate T10 compression fracture. Osteopenia. IMPRESSION: Stable cardiomegaly and COPD. Electronically Signed   By: Awilda Metro M.D.   On: 10/16/2016 23:34   Ct Head Wo Contrast  Result Date: 10/16/2016 CLINICAL DATA:  83 year old male with altered mental status and weakness today. EXAM: CT HEAD WITHOUT CONTRAST TECHNIQUE: Contiguous axial images were obtained from the base of the skull through the vertex without intravenous contrast. COMPARISON:  None. FINDINGS: Brain: Mild cerebral atrophy. Patchy and confluent areas of decreased attenuation are noted throughout the deep and periventricular white matter of the cerebral hemispheres bilaterally, compatible with chronic microvascular ischemic disease. No evidence of acute infarction, hemorrhage, hydrocephalus, extra-axial collection or mass lesion/mass effect. Vascular: No hyperdense vessel or unexpected calcification. Skull: Normal. Negative for fracture or focal lesion. Sinuses/Orbits: Mild multifocal mucosal thickening in the ethmoid sinuses. No acute finding. Other: None. IMPRESSION: 1. No acute intracranial  abnormalities. 2. Mild cerebral atrophy with mild chronic microvascular ischemic changes in the cerebral white matter. Electronically Signed   By: Trudie Reed M.D.   On: 10/16/2016 23:24    EKG: Independently reviewed. Sinus tachycardia.  Assessment/Plan Principal Problem:   SIRS (systemic inflammatory response syndrome) (HCC) Active Problems:   Essential hypertension, benign    1. SIRS - likely source would be respiratory tract given the patient's persistent cough. Check blood cultures sputum cultures urine for Legionella and strep antigen and HIV status. Patient is placed on empirically ceftriaxone and Zithromax. Obtain swallow evaluation. No definite signs of meningitis at this time. Continue with hydration and follow lactate levels and pro calcitonin levels. 2. Dementia with history of B-12 deficiency as per the chart - not been taking his medications. Check B-12 levels. 3. Elevated troponin - likely from developing sepsis. Check 2-D echo. 4. History of A. fib per the chart. Closely monitor in telemetry.  I have reviewed patient's old charts on labs. Unable to reach patient's son and niece with the numbers listed in the chart. Need to discuss with family to see patient's baseline memory status.   DVT prophylaxis: SCDs. Code Status: Full code.  Family Communication: Unable to reach family.  Disposition Plan: Back to home once stable. Consults called: None.  Admission status: Inpatient.    Eduard Clos MD [facility] Pager [phone].  If 7PM-7AM, please contact night-coverage [email or web address] Password [email or web address]  10/17/2016, 4:11 AM

## 2016-10-17 NOTE — Evaluation (Signed)
Clinical/Bedside Swallow Evaluation Patient Details  Name: Jon Cross MRN: [id number] Date of Birth: 09-27-1932  Today's Date: 10/17/2016 Time: SLP Start Time (ACUTE ONLY): 0945 SLP Stop Time (ACUTE ONLY): 1015 SLP Time Calculation (min) (ACUTE ONLY): 30 min  Past Medical History:  Past Medical History:  Diagnosis Date  . Alzheimer disease   . Atrial fibrillation (HCC)   . Essential hypertension, benign   . Herpes zoster   . Staphylococcal skin infection    Past Surgical History:  Past Surgical History:  Procedure Laterality Date  . INGUINAL HERNIA REPAIR    . TIBIA FRACTURE SURGERY     Open reduction and internal fixation of left tibia fracture  . TONSILLECTOMY    . UMBILICAL HERNIA REPAIR     HPI:  Jon Cross a 83 y.o.malewith history of dementia, chronic kidney disease stage II A. fib as per the charts was brought to the ER. Patient to follow very weak and mildly confused. As per the report patient has been working in his yard today and went back to his [place] park when patient was found to be weak and confused. Patient has not complained of any chest pain or shortness of breath nausea vomiting or diarrhea. Has been having persistent cough.In the ER patient is being found to have productive cough is oriented to his name only. Chest x-ray and UA are unremarkable. Patient was tachycardic and febrile with temperatures around 103F. No signs of meningitis. Patient had blood cultures drawn and since patient was having productive cough and began antibiotics for pneumonia were started. Troponin is found to be very elevated. EKG showssinus tachycardia.CT of the head was unremarkable. BSE ordered.   Assessment / Plan / Recommendation Clinical Impression  Pt assessed at bedside with RN present (to present medications). Pt lethargic, but rouses to voice and is agreeable to po trials. Pt shivering and stated frequently that he was cold. Oral cavity dry, but clean. Pt with  congested cough and able to expectorate thick phlegm. Pt assessed with limited po trials due to lethargy. He was unable to swallow one of his pills whole with puree, additional medications were presented crushed in puree with seemingly good tolerance. Recommend D1/puree with thin liquids and po medications crushed as able in puree ONLY when Pt is alert and upright. D/W RN and will go ahead and downgrade diet, but diet may need to be held if alertness is not sustained during mealtimes. SLP will follow. SLP Visit Diagnosis: Dysphagia, oropharyngeal phase (R13.12)    Aspiration Risk  Mild aspiration risk;Moderate aspiration risk    Diet Recommendation Dysphagia 1 (Puree);Thin liquid   Liquid Administration via: Cup Medication Administration: Crushed with puree Supervision: Staff to assist with self feeding Compensations: Minimize environmental distractions;Slow rate;Small sips/bites Postural Changes: Seated upright at 90 degrees;Remain upright for at least 30 minutes after po intake    Other  Recommendations Oral Care Recommendations: Oral care BID;Staff/trained caregiver to provide oral care Other Recommendations: Clarify dietary restrictions   Follow up Recommendations  (pending)      Frequency and Duration            Prognosis        Swallow Study   General Date of Onset: 10/16/16 HPI: Jon Cross a 83 y.o.malewith history of dementia, chronic kidney disease stage II A. fib as per the charts was brought to the ER. Patient to follow very weak and mildly confused. As per the report patient has been working in his yard today  and went back to his trailer park when patient was found to be weak and confused. Patient has not complained of any chest pain or shortness of breath nausea vomiting or diarrhea. Has been having persistent cough.In the ER patient is being found to have productive cough is oriented to his name only. Chest x-ray and UA are unremarkable. Patient was tachycardic  and febrile with temperatures around 103F. No signs of meningitis. Patient had blood cultures drawn and since patient was having productive cough and began antibiotics for pneumonia were started. Troponin is found to be very elevated. EKG showssinus tachycardia.CT of the head was unremarkable. BSE ordered. Type of Study: Bedside Swallow Evaluation Previous Swallow Assessment: None on record Diet Prior to this Study: Regular;Thin liquids Temperature Spikes Noted: Yes Respiratory Status: Room air History of Recent Intubation: No Behavior/Cognition: Cooperative;Pleasant mood;Lethargic/Drowsy Oral Cavity Assessment: Within Functional Limits Oral Care Completed by SLP: Yes Oral Cavity - Dentition: Adequate natural dentition Vision: Impaired for self-feeding Self-Feeding Abilities: Needs assist Patient Positioning: Upright in bed Baseline Vocal Quality: Normal Volitional Cough: Congested Volitional Swallow: Able to elicit    Oral/Motor/Sensory Function Overall Oral Motor/Sensory Function: Generalized oral weakness (Pt lethargic)   Ice Chips Ice chips: Within functional limits Presentation: Spoon   Thin Liquid      Nectar Thick     Honey Thick     Puree     Solid   Thank you,  Jon Cross, CCC-SLP [phone]             Jon Cross 10/17/2016,2:39 PM

## 2016-10-17 NOTE — Progress Notes (Addendum)
CRITICAL VALUE ALERT  Critical Value:  Troponin of 0.21  Date & Time Notied:  10/17/16 @ 11:26am  Provider Notified: Dr. Ardyth HarpsHernandez  Orders Received/Actions taken: Dr put in orders for 12 Lead EKG, an ECHO, and a chest x-ray.

## 2016-10-17 NOTE — ED Provider Notes (Signed)
[facility]Y DEPT Provider Note   CSN: [id number] Arrival date & time: 10/16/16  2139  Time seen 23:45 PM  History   Chief Complaint Chief Complaint  Patient presents with  . Weakness   Level V caveat for dementia  HPI Jon Cross is a 83 y.o. male.  HPI patient reports he was in the yard today when he got cold. He states he started having a cough this evening and felt short of breath earlier but not right this minute. He denies chest pain, vomiting, or diarrhea. Patient is noted to have a pretty continuous cough during our interview.  PCP Joette CatchingNyland, Leonard, MD   Past Medical History:  Diagnosis Date  . Alzheimer disease   . Atrial fibrillation (HCC)   . Essential hypertension, benign   . Herpes zoster   . Staphylococcal skin infection     Patient Active Problem List   Diagnosis Date Noted  . SIRS (systemic inflammatory response syndrome) (HCC) 10/17/2016  . Abnormal ECG 08/23/2012  . Essential hypertension, benign 12/11/2008  . PREMATURE ATRIAL CONTRACTIONS 12/11/2008  . CARDIAC MURMUR 12/11/2008    Past Surgical History:  Procedure Laterality Date  . INGUINAL HERNIA REPAIR    . TIBIA FRACTURE SURGERY     Open reduction and internal fixation of left tibia fracture  . TONSILLECTOMY    . UMBILICAL HERNIA REPAIR         Home Medications    Prior to Admission medications   Medication Sig Start Date End Date Taking? Authorizing Provider  acetaminophen (TYLENOL) 500 MG tablet Take 500 mg by mouth every 6 (six) hours as needed.    [provider]  aspirin EC 81 MG tablet Take 81 mg by mouth daily.    [provider]    Family History Family History  Problem Relation Age of Onset  . Cancer Unknown     Social History Social History  Substance Use Topics  . Smoking status: Current Every Day Smoker    Packs/day: 1.00    Years: 31.00    Types: Cigarettes  . Smokeless tobacco: Never Used     Comment: quit smoking for 20 years  .  Alcohol use Yes  lives at home Lives with daughter   Allergies   Dristan cold [chlorphen-pe-acetaminophen] and Sulfonamide derivatives   Review of Systems Review of Systems  Unable to perform ROS: Dementia     Physical Exam Updated Vital Signs BP 136/79   Pulse (!) 101   Temp (!) 102.7 F (39.3 C) (Rectal)   Resp 18   Ht 5\' 6"  (1.676 m)   Wt 65.8 kg (145 lb)   SpO2 98%   BMI 23.40 kg/m   Vital signs normal except for fever borderline tachycardia   Physical Exam  Constitutional: He is oriented to person, place, and time. He appears well-developed and well-nourished.  Non-toxic appearance. He does not appear ill. No distress.  Elderly male who is constantly coughing  HENT:  Head: Normocephalic and atraumatic.  Right Ear: External ear normal.  Left Ear: External ear normal.  Nose: Nose normal. No mucosal edema or rhinorrhea.  Mouth/Throat: Mucous membranes are dry. No dental abscesses or uvula swelling.  Dry mucous membranes  Eyes: Pupils are equal, round, and reactive to light. Conjunctivae and EOM are normal.  Neck: Normal range of motion and full passive range of motion without pain. Neck supple.  Cardiovascular: Normal rate, regular rhythm and normal heart sounds.  Exam reveals no gallop and no friction  rub.   No murmur heard. Pulmonary/Chest: Effort normal. No respiratory distress. He has decreased breath sounds. He has no wheezes. He has no rhonchi. He has rales in the right middle field, the right lower field, the left middle field and the left lower field. He exhibits no tenderness and no crepitus.  Abdominal: Soft. Normal appearance and bowel sounds are normal. He exhibits no distension. There is no tenderness. There is no rebound and no guarding.  Musculoskeletal: Normal range of motion. He exhibits no edema or tenderness.  Moves all extremities well.   Neurological: He is alert and oriented to person, place, and time. He has normal strength. No cranial nerve  deficit.  Skin: Skin is warm, dry and intact. No rash noted. No erythema. No pallor.  Psychiatric: He has a normal mood and affect. His speech is normal and behavior is normal. His mood appears not anxious.  Nursing note and vitals reviewed.    ED Treatments / Results  Labs (all labs ordered are listed, but only abnormal results are displayed) Results for orders placed or performed during the hospital encounter of 10/16/16  Blood Culture (routine x 2)  Result Value Ref Range   Specimen Description BLOOD RIGHT ARM    Special Requests      BOTTLES DRAWN AEROBIC AND ANAEROBIC Blood Culture adequate volume   Culture PENDING    Report Status PENDING   Blood Culture (routine x 2)  Result Value Ref Range   Specimen Description BLOOD LEFT ARM    Special Requests      BOTTLES DRAWN AEROBIC ONLY Blood Culture results may not be optimal due to an inadequate volume of blood received in culture bottles   Culture PENDING    Report Status PENDING   Basic metabolic panel  Result Value Ref Range   Sodium 138 135 - 145 mmol/L   Potassium 4.4 3.5 - 5.1 mmol/L   Chloride 101 101 - 111 mmol/L   CO2 28 22 - 32 mmol/L   Glucose, Bld 107 (H) 65 - 99 mg/dL   BUN 16 6 - 20 mg/dL   Creatinine, Ser 4.09 (H) 0.61 - 1.24 mg/dL   Calcium 8.9 8.9 - 81.1 mg/dL   GFR calc non Af Amer 41 (L) >60 mL/min   GFR calc Af Amer 47 (L) >60 mL/min   Anion gap 9 5 - 15  CBC  Result Value Ref Range   WBC 20.5 (H) 4.0 - 10.5 K/uL   RBC 4.35 4.22 - 5.81 MIL/uL   Hemoglobin 13.8 13.0 - 17.0 g/dL   HCT 91.4 78.2 - 95.6 %   MCV 97.0 78.0 - 100.0 fL   MCH 31.7 26.0 - 34.0 pg   MCHC 32.7 30.0 - 36.0 g/dL   RDW 21.3 08.6 - 57.8 %   Platelets 125 (L) 150 - 400 K/uL  Urinalysis, Routine w reflex microscopic  Result Value Ref Range   Color, Urine YELLOW YELLOW   APPearance CLEAR CLEAR   Specific Gravity, Urine 1.018 1.005 - 1.030   pH 7.0 5.0 - 8.0   Glucose, UA NEGATIVE NEGATIVE mg/dL   Hgb urine dipstick NEGATIVE  NEGATIVE   Bilirubin Urine NEGATIVE NEGATIVE   Ketones, ur NEGATIVE NEGATIVE mg/dL   Protein, ur 30 (A) NEGATIVE mg/dL   Nitrite NEGATIVE NEGATIVE   Leukocytes, UA NEGATIVE NEGATIVE   RBC / HPF 0-5 0 - 5 RBC/hpf   WBC, UA 0-5 0 - 5 WBC/hpf   Bacteria, UA NONE SEEN NONE SEEN  Squamous Epithelial / LPF 0-5 (A) NONE SEEN   Mucous PRESENT   CK  Result Value Ref Range   Total CK 38 (L) 49 - 397 U/L  Troponin I  Result Value Ref Range   Troponin I 0.06 (HH) <0.03 ng/mL  Differential  Result Value Ref Range   Neutrophils Relative % 92 %   Neutro Abs 18.1 (H) 1.7 - 7.7 K/uL   Lymphocytes Relative 3 %   Lymphs Abs 0.5 (L) 0.7 - 4.0 K/uL   Monocytes Relative 5 %   Monocytes Absolute 1.1 (H) 0.1 - 1.0 K/uL   Eosinophils Relative 0 %   Eosinophils Absolute 0.0 0.0 - 0.7 K/uL   Basophils Relative 0 %   Basophils Absolute 0.0 0.0 - 0.1 K/uL  Hepatic function panel  Result Value Ref Range   Total Protein 6.2 (L) 6.5 - 8.1 g/dL   Albumin 3.4 (L) 3.5 - 5.0 g/dL   AST 43 (H) 15 - 41 U/L   ALT 34 17 - 63 U/L   Alkaline Phosphatase 70 38 - 126 U/L   Total Bilirubin 1.4 (H) 0.3 - 1.2 mg/dL   Bilirubin, Direct 0.3 0.1 - 0.5 mg/dL   Indirect Bilirubin 1.1 (H) 0.3 - 0.9 mg/dL  Lactic acid, plasma  Result Value Ref Range   Lactic Acid, Venous 1.4 0.5 - 1.9 mmol/L  Troponin I  Result Value Ref Range   Troponin I 0.07 (HH) <0.03 ng/mL   Laboratory interpretation all normal except Renal insufficiency, leukocytosis, positive troponin    EKG  EKG Interpretation  Date/Time:  Sunday October 16 2016 21:50:20 EDT Ventricular Rate:  89 PR Interval:    QRS Duration: 102 QT Interval:  319 QTC Calculation: 389 R Axis:   -34 Text Interpretation:  Sinus tachycardia Atrial premature complexes Left axis deviation Abnormal inferior Q waves Borderline T abnormalities, lateral leads ST elevation, consider anterior injury No old tracing to compare Confirmed by Devoria Albe ([id number]) on 10/16/2016 11:06:29  PM       Radiology Dg Chest 2 View  Result Date: 10/16/2016 CLINICAL DATA:  Altered mental status and weakness after extended time outside. EXAM: CHEST  2 VIEW COMPARISON:  Chest radiograph June 24, 2014 FINDINGS: Cardiac silhouette is moderately enlarged. Calcified aortic knob. Similar mild chronic interstitial changes without pleural effusion or focal consolidation. Increased lung volumes. No pneumothorax. Accentuated kyphosis, chronic approximate T10 compression fracture. Osteopenia. IMPRESSION: Stable cardiomegaly and COPD. Electronically Signed   By: Awilda Metro M.D.   On: 10/16/2016 23:34   Ct Head Wo Contrast  Result Date: 10/16/2016 CLINICAL DATA:  83 year old male with altered mental status and weakness today. EXAM: CT HEAD WITHOUT CONTRAST TECHNIQUE: Contiguous axial images were obtained from the base of the skull through the vertex without intravenous contrast. COMPARISON:  None. FINDINGS: Brain: Mild cerebral atrophy. Patchy and confluent areas of decreased attenuation are noted throughout the deep and periventricular white matter of the cerebral hemispheres bilaterally, compatible with chronic microvascular ischemic disease. No evidence of acute infarction, hemorrhage, hydrocephalus, extra-axial collection or mass lesion/mass effect. Vascular: No hyperdense vessel or unexpected calcification. Skull: Normal. Negative for fracture or focal lesion. Sinuses/Orbits: Mild multifocal mucosal thickening in the ethmoid sinuses. No acute finding. Other: None. IMPRESSION: 1. No acute intracranial abnormalities. 2. Mild cerebral atrophy with mild chronic microvascular ischemic changes in the cerebral white matter. Electronically Signed   By: Trudie Reed M.D.   On: 10/16/2016 23:24    Procedures Procedures (including critical care time)  Medications Ordered in ED Medications  cefTRIAXone (ROCEPHIN) 1 g in dextrose 5 % 50 mL IVPB (1 g Intravenous New Bag/Given 10/17/16 0049)   azithromycin (ZITHROMAX) 500 mg in dextrose 5 % 250 mL IVPB (500 mg Intravenous New Bag/Given 10/17/16 0050)  sodium chloride 0.9 % bolus 1,000 mL (1,000 mLs Intravenous New Bag/Given 10/17/16 0014)    And  sodium chloride 0.9 % bolus 1,000 mL (1,000 mLs Intravenous New Bag/Given 10/17/16 0013)  acetaminophen (TYLENOL) tablet 650 mg (650 mg Oral Given 10/17/16 0026)     Initial Impression / Assessment and Plan / ED Course  I have reviewed the triage vital signs and the nursing notes.  Pertinent labs & imaging results that were available during my care of the patient were reviewed by me and considered in my medical decision making (see chart for details).  Patient was given IV fluids, his fever was treated with Tylenol. Although he does not have an infiltrate on his x-ray I suspect he has pneumonia and/or bronchitis based on the amount of coughing he's doing. He was started on community-acquired pneumonia antibiotics.  Delta troponin was done due to the elevation of his initial troponin and it was basically the same. It went from 0.06 to 0.07.  02:38 AM Dr Fredrich Romans, hospitalist, will admit   Final Clinical Impressions(s) / ED Diagnoses   Final diagnoses:  Fever in other diseases  Community acquired pneumonia, unspecified laterality    Plan admission  Devoria Albe, MD, Concha Pyo, MD 10/17/16 [phone]

## 2016-10-17 NOTE — Progress Notes (Addendum)
ANTIBIOTIC CONSULT NOTE  Pharmacy Consult for CAP and Bacteremia ABX:  Rocephin, Zithromax, and Vancomycin  Allergies  Allergen Reactions  . Dristan Cold [Chlorphen-Pe-Acetaminophen] Nausea Only  . Sulfonamide Derivatives Nausea Only   Patient Measurements: Height: 5\' 6"  (167.6 cm) Weight: 139 lb 6.4 oz (63.2 kg) IBW/kg (Calculated) : 63.8  Vital Signs: Temp: 99.6 F (37.6 C) (07/16 1021) Temp Source: Oral (07/16 1021) BP: 132/64 (07/16 0408) Pulse Rate: 115 (07/16 0408)  Labs:  Recent Labs  10/16/16 2212 10/17/16 0455  WBC 20.5* 12.8*  HGB 13.8 13.3  PLT 125* 96*  CREATININE 1.54* 1.41*   No results for input(s): VANCOTROUGH, VANCOPEAK, VANCORANDOM, GENTTROUGH, GENTPEAK, GENTRANDOM, TOBRATROUGH, TOBRAPEAK, TOBRARND, AMIKACINPEAK, AMIKACINTROU, AMIKACIN in the last 72 hours.   Medical History: Past Medical History:  Diagnosis Date  . Alzheimer disease   . Atrial fibrillation (HCC)   . Essential hypertension, benign   . Herpes zoster   . Staphylococcal skin infection    Assessment: 83yo male with SOB and cough.  Started on Rocephin and Zithromax for suspected CAP.  No renal adjustment needed.  2/2 BCx reported (+) for GPC, Vanc added.  Estimated Creatinine Clearance: 36.7 mL/min (A) (by C-G formula based on SCr of 1.41 mg/dL (H)).  Plan: Rocephin 2gm IV q24hrs Zithromax 500mg  IV q24hrs Vancomycin 1500mg  x 1 now then 1000mg  IV q24hrs Check trough at steady state Monitor labs, progress, c/s F/U to switch Zithromax to PO  Wayland DenisHall, Carlena Ruybal A, [facility]PH 10/17/2016

## 2016-10-18 ENCOUNTER — Encounter (HOSPITAL_COMMUNITY): Payer: Self-pay | Admitting: Primary Care

## 2016-10-18 ENCOUNTER — Inpatient Hospital Stay (HOSPITAL_COMMUNITY): Payer: Medicare HMO

## 2016-10-18 DIAGNOSIS — G9341 Metabolic encephalopathy: Secondary | ICD-10-CM

## 2016-10-18 DIAGNOSIS — R6510 Systemic inflammatory response syndrome (SIRS) of non-infectious origin without acute organ dysfunction: Secondary | ICD-10-CM

## 2016-10-18 DIAGNOSIS — Z7189 Other specified counseling: Secondary | ICD-10-CM

## 2016-10-18 DIAGNOSIS — Z515 Encounter for palliative care: Secondary | ICD-10-CM

## 2016-10-18 LAB — COMPREHENSIVE METABOLIC PANEL
ALBUMIN: 2.6 g/dL — AB (ref 3.5–5.0)
ALT: 32 U/L (ref 17–63)
AST: 49 U/L — AB (ref 15–41)
Alkaline Phosphatase: 47 U/L (ref 38–126)
Anion gap: 10 (ref 5–15)
BILIRUBIN TOTAL: 0.7 mg/dL (ref 0.3–1.2)
BUN: 29 mg/dL — AB (ref 6–20)
CHLORIDE: 107 mmol/L (ref 101–111)
CO2: 25 mmol/L (ref 22–32)
Calcium: 8 mg/dL — ABNORMAL LOW (ref 8.9–10.3)
Creatinine, Ser: 1.78 mg/dL — ABNORMAL HIGH (ref 0.61–1.24)
GFR calc Af Amer: 39 mL/min — ABNORMAL LOW (ref >60)
GFR calc non Af Amer: 34 mL/min — ABNORMAL LOW (ref >60)
GLUCOSE: 75 mg/dL (ref 65–99)
POTASSIUM: 4 mmol/L (ref 3.5–5.1)
Sodium: 142 mmol/L (ref 135–145)
Total Protein: 4.8 g/dL — ABNORMAL LOW (ref 6.5–8.1)

## 2016-10-18 LAB — GRAM STAIN

## 2016-10-18 LAB — CSF CELL COUNT WITH DIFFERENTIAL
Eosinophils, CSF: 2 % — ABNORMAL HIGH (ref 0–1)
Lymphs, CSF: 7 % — ABNORMAL LOW (ref 40–80)
Monocyte-Macrophage-Spinal Fluid: 3 % — ABNORMAL LOW (ref 15–45)
RBC Count, CSF: 22 /mm3 — ABNORMAL HIGH
SEGMENTED NEUTROPHILS-CSF: 88 % — AB (ref 0–6)
Tube #: 4
WBC CSF: 14 /mm3 — AB (ref 0–5)

## 2016-10-18 LAB — CBC
HEMATOCRIT: 35.8 % — AB (ref 39.0–52.0)
Hemoglobin: 11.9 g/dL — ABNORMAL LOW (ref 13.0–17.0)
MCH: 32.2 pg (ref 26.0–34.0)
MCHC: 33.2 g/dL (ref 30.0–36.0)
MCV: 97 fL (ref 78.0–100.0)
Platelets: 71 10*3/uL — ABNORMAL LOW (ref 150–400)
RBC: 3.69 MIL/uL — ABNORMAL LOW (ref 4.22–5.81)
RDW: 13.7 % (ref 11.5–15.5)
WBC: 12.1 10*3/uL — ABNORMAL HIGH (ref 4.0–10.5)

## 2016-10-18 LAB — URINE CULTURE: CULTURE: NO GROWTH

## 2016-10-18 LAB — PROTEIN, CSF: Total  Protein, CSF: 41 mg/dL (ref 15–45)

## 2016-10-18 LAB — GLUCOSE, CSF: Glucose, CSF: 51 mg/dL (ref 40–70)

## 2016-10-18 MED ORDER — DEXTROSE 5 % IV SOLN
660.0000 mg | Freq: Two times a day (BID) | INTRAVENOUS | Status: DC
  Administered 2016-10-18 – 2016-10-21 (×6): 660 mg via INTRAVENOUS
  Filled 2016-10-18 (×12): qty 13.2

## 2016-10-18 NOTE — Consult Note (Signed)
Consultation Note Date: 10/18/2016   Patient Name: Jon Cross  DOB: [birth year]  MRN: [id number]  Age / Sex: 83 y.o., male  PCP: Joette Catching, MD Referring Physician: Philip Aspen, Minerva Ends*  Reason for Consultation: Establishing goals of care and Psychosocial/spiritual support  HPI/Patient Profile: 83 y.o. male  with past medical history of Alzheimer's disease, atrial fibrillation, hypertension, staphylococcal skin infection history, herpes zoster infection history admitted on 10/16/2016 with SI RSS, found to have bacteremia.   Clinical Assessment and Goals of Care: Mr. Lawhorn is resting quietly in bed. He does not look at me or try to speak until I called his name. He is confused, oriented to self only. There is no family at bedside at this time. He denies pain or discomfort. He is able to take a few sips of water without overt signs of aspiration.  Call to son Pranshu Lyster. [name] states that he is in [place] with his wife. He states that they are waiting for an MRI. We talk about Mr. Korman's health problems.  [name] states that he was aware that his father had positive blood cultures. I share that preliminary results from spinal tap show increased white blood cells, likely infection.   We talk about bacteremia. We talk about treatment plan and options. I share that there is risk for bacteria "seeding" if there's vegetation in heart valves (involves shining a light down Mr. Dewalt's throat to look at his heart valves), and he is also at risk of bacteria seeding at any artificial hardware in the body. [name] states that Mr. Vogelsang has pins and a plate in his left lower leg from a tib/fib fracture in 1974. I share that Mr. Brigham is very ill, but that we are treating him with 3 IV antibiotics at this time.  [name] asks about nutrition, and I share that Mr. Reaume, at this point, is not alert and oriented enough  to feed himself, but that we are offering food and drink. We plan for a phone conference tomorrow 7/18 at 10:30 AM.  We talk about advance directives, see below.  Healthcare power of attorney HCPOA - Son Darell Petrosino states that he has legal healthcare power of attorney. He also tells me that he has a copy of Mr. Hartig advance directive in his safe at home in [place].   SUMMARY OF RECOMMENDATIONS   I share the severity of Mr. Shipman's illness. Son Krishna Dancel states that, at this point, he wants full scope of treatment.  Code Status/Advance Care Planning:  Full code - we discussed the concept of treat the treatable, but allowing natural death when/if Mr. Carollo's heart were to naturally stop.  Symptom Management:   per hospitalist, no additional needs at this time.  Palliative Prophylaxis:   Aspiration and Turn Reposition  Additional Recommendations (Limitations, Scope, Preferences):  Full Scope Treatment  Psycho-social/Spiritual:   Desire for further Chaplaincy support:no  Additional Recommendations: Caregiving  Support/Resources  Prognosis:   Unable to determine, based on outcomes. 3 months or less would not  be surprising based on bacteremia and cerebrospinal fluid with increased white blood cells.  Discharge Planning: To Be Determined      Primary Diagnoses: Present on Admission: . SIRS (systemic inflammatory response syndrome) (HCC) . Essential hypertension, benign   I have reviewed the medical record, interviewed the patient and family, and examined the patient. The following aspects are pertinent.  Past Medical History:  Diagnosis Date  . Alzheimer disease   . Atrial fibrillation (HCC)   . Essential hypertension, benign   . Herpes zoster   . Staphylococcal skin infection    Social History   Social History  . Marital status: Single    Spouse name: N/A  . Number of children: N/A  . Years of education: N/A   Social History Main Topics  . Smoking  status: Current Every Day Smoker    Packs/day: 1.00    Years: 31.00    Types: Cigarettes  . Smokeless tobacco: Never Used     Comment: quit smoking for 20 years  . Alcohol use Yes  . Drug use: No  . Sexual activity: Not Asked   Other Topics Concern  . None   Social History Narrative  . None   Family History  Problem Relation Age of Onset  . Cancer Unknown    Scheduled Meds: . aspirin EC  81 mg Oral Daily  . chlorhexidine  15 mL Mouth Rinse BID  . mouth rinse  15 mL Mouth Rinse q12n4p   Continuous Infusions: . azithromycin Stopped (10/17/16 1842)  . cefTRIAXone (ROCEPHIN)  IV Stopped (10/17/16 1723)  . vancomycin Stopped (10/18/16 1157)   PRN Meds:.acetaminophen **OR** acetaminophen Medications Prior to Admission:  Prior to Admission medications   Medication Sig Start Date End Date Taking? Authorizing Provider  memantine (NAMENDA) 5 MG tablet Take 5 mg by mouth daily.   Yes [provider]  acetaminophen (TYLENOL) 500 MG tablet Take 500 mg by mouth every 6 (six) hours as needed.    [provider]  aspirin EC 81 MG tablet Take 81 mg by mouth daily.    [provider]   Allergies  Allergen Reactions  . Dristan Cold [Chlorphen-Pe-Acetaminophen] Nausea Only  . Sulfonamide Derivatives Nausea Only   Review of Systems  Unable to perform ROS: Mental status change    Physical Exam  Constitutional: No distress.  Briefly makes but does not keep eye contact, calm and cooperative  HENT:  Head: Atraumatic.  Cardiovascular: Normal rate and regular rhythm.   Pulmonary/Chest: Effort normal. No respiratory distress.  Abdominal: Soft. He exhibits no distension.  Musculoskeletal: He exhibits no edema.  Neurological:  Known dementia, oriented self only  Skin: Skin is warm and dry.  Nursing note and vitals reviewed.   Vital Signs: BP (!) 141/72 (BP Location: Right Arm)   Pulse 77   Temp 98.1 F (36.7 C) (Oral)   Resp 18   Ht 5\' 6"  (1.676 m)    Wt 65.5 kg (144 lb 8 oz)   SpO2 98%   BMI 23.32 kg/m  Pain Assessment: 0-10   Pain Score: Asleep   SpO2: SpO2: 98 % O2 Device:SpO2: 98 % O2 Flow Rate: .   IO: Intake/output summary:  Intake/Output Summary (Last 24 hours) at 10/18/16 1409 Last data filed at 10/18/16 1402  Gross per 24 hour  Intake          2070.83 ml  Output             1050 ml  Net  1020.83 ml    LBM: Last BM Date: 10/15/16 Baseline Weight: Weight: 65.8 kg (145 lb) Most recent weight: Weight: 65.5 kg (144 lb 8 oz)     Palliative Assessment/Data:   Flowsheet Rows     Most Recent Value  Intake Tab  Referral Department  Hospitalist  Unit at Time of Referral  Med/Surg Unit  Palliative Care Primary Diagnosis  Sepsis/Infectious Disease  Date Notified  10/17/16  Palliative Care Type  New Palliative care  Reason for referral  Clarify Goals of Care  Date of Admission  10/16/16  Date first seen by Palliative Care  10/18/16  # of days Palliative referral response time  1 Day(s)  # of days IP prior to Palliative referral  1  Clinical Assessment  Palliative Performance Scale Score  20%  Pain Max last 24 hours  Not able to report  Pain Min Last 24 hours  Not able to report  Dyspnea Max Last 24 Hours  Not able to report  Dyspnea Min Last 24 hours  Not able to report  Psychosocial & Spiritual Assessment  Palliative Care Outcomes  Patient/Family meeting held?  No [Patient is confused, no family at bedside]  Palliative Care Outcomes  Provided psychosocial or spiritual support  Palliative Care follow-up planned  Yes, Facility      Time In: 1220 Time Out: 1310 Time Total: 50 minutes Greater than 50%  of this time was spent counseling and coordinating care related to the above assessment and plan.  Signed by: Katheran Awe, NP   Please contact Palliative Medicine Team phone at [phone] for questions and concerns.  For individual provider: See Loretha Stapler

## 2016-10-18 NOTE — Progress Notes (Signed)
Lumbar puncture complete no signs of distress  

## 2016-10-18 NOTE — Progress Notes (Signed)
Pharmacy Antibiotic Note  Jon Cross is a 83 y.o. male admitted on 10/16/2016 with bacteremia and possible meningitis.  Pharmacy has been consulted for Acyclovir dosing.  Plan: Acyclovir 10mg /kg 660mg  IV q12h (adjusted for renal dysfunction) Also on Vancomycin 1000mg  IV q24hrs F/U cxs and clinical progress Monitor V/S, labs, and levels as indicated  Height: 5\' 6"  (167.6 cm) Weight: 144 lb 8 oz (65.5 kg) IBW/kg (Calculated) : 63.8  Temp (24hrs), Avg:98.7 F (37.1 C), Min:98.1 F (36.7 C), Max:99.9 F (37.7 C)   Recent Labs Lab 10/16/16 2212 10/17/16 0049 10/17/16 0455 10/18/16 0406  WBC 20.5*  --  12.8* 12.1*  CREATININE 1.54*  --  1.41* 1.78*  LATICACIDVEN  --  1.4 1.7  --     Estimated Creatinine Clearance: 29.4 mL/min (A) (by C-G formula based on SCr of 1.78 mg/dL (H)).    Allergies  Allergen Reactions  . Dristan Cold [Chlorphen-Pe-Acetaminophen] Nausea Only  . Sulfonamide Derivatives Nausea Only    Antimicrobials this admission: Acyclovir 7/17 >>  Azithromycin 7/16 >> 7/17 Rocephin 7/16 >> 7/17 Vancomycin 7/16 >>  Dose adjustments this admission: N/A  Microbiology results: 7/16 BCx:  BCID +MRSA x 2 bottles 7/15 UCx: no growth 7/17 CSF:  pending  Thank you for allowing pharmacy to be a part of this patient's care.  Elder CyphersLorie Eliette Drumwright, BS Pharm D, [place] Clinical Pharmacist Pager [phone] 10/18/2016 5:25 PM

## 2016-10-18 NOTE — Consult Note (Signed)
CHAMP autoconsult for Staph aureus bacteremia.    Will need prolonged IV vancomycin Repeat blood cultures in 1-2 days to assure clearance. TTE and TEE if TTE negative for vegetation.   If no TEE done would treat for 4-6 weeks. If TEE negative for vegetation, 2 weeks from negative blood culture. Evaluate for other source clinically such as new back pain, joints and appro0priate imaging for any concerns.    Call with questions. Thanks Staci RighterOMER, Clementina Mareno, MD

## 2016-10-18 NOTE — Progress Notes (Addendum)
PROGRESS NOTE    Jon Cross  WUJ:[id number] DOB: August 02, 1932 DOA: 10/16/2016 PCP: Joette Catching, MD     Brief Narrative:  83 year old man admitted from home on 7/16 due to significant confusion and weakness. Chest x-ray and UA were unremarkable. Subsequently 2 out of 2 blood cultures have grown MRSA. Additionally, a lumbar puncture has yielded 14 WBCs with PMN predominance. He remains very encephalopathic.   Assessment & Plan:   Principal Problem:   Sepsis due to methicillin resistant Staphylococcus aureus (MRSA) (HCC) Active Problems:   Essential hypertension, benign   Palliative care encounter   Goals of care, counseling/discussion   DNR (do not resuscitate) discussion   Acute metabolic encephalopathy   Sepsis -Due to MRSA bacteremia, source is still unclear at this point. He does have history of a lower extremity tib-fib fracture and has some hardware. -To continue vancomycin pending culture sensitivities. -Will check blood cultures tomorrow to document clearance prior to placing a PICC line for long-term antibiotics. -2-D echo without signs of vegetation. Will need to discuss with son whether they would want to do a TEE versus just treating him presumptively with 4 weeks of IV antibiotics. -Also of concern is his CSF results yielding 14 white blood cells with neutrophil predominance. -Have sent for HSV PSR, discussed with ID Dr. Luciana Axe, will start on acyclovir empirically.  Acute encephalopathy -Due to ongoing sepsis, still very somnolent.   DVT prophylaxis: SCDs Code Status: Remains full code for now Family Communication: None, palliative care has discussed briefly with son over phone Disposition Plan: Pending medical stability, will need to have continued goals of care conversations with son.  Consultants:   ID via phone  Procedures:   Lumbar puncture on 7/17  Antimicrobials:  Anti-infectives    Start     Dose/Rate Route Frequency Ordered Stop   10/18/16  1800  acyclovir (ZOVIRAX) 660 mg in dextrose 5 % 100 mL IVPB     660 mg 113.2 mL/hr over 60 Minutes Intravenous Every 12 hours 10/18/16 1725     10/18/16 1100  vancomycin (VANCOCIN) IVPB 1000 mg/200 mL premix     1,000 mg 200 mL/hr over 60 Minutes Intravenous Every 24 hours 10/17/16 1254     10/17/16 1830  azithromycin (ZITHROMAX) 500 mg in dextrose 5 % 250 mL IVPB  Status:  Discontinued     500 mg 250 mL/hr over 60 Minutes Intravenous Every 24 hours 10/17/16 0749 10/18/16 1719   10/17/16 1800  cefTRIAXone (ROCEPHIN) 2 g in dextrose 5 % 50 mL IVPB  Status:  Discontinued     2 g 100 mL/hr over 30 Minutes Intravenous Every 24 hours 10/17/16 0748 10/18/16 1719   10/17/16 1330  vancomycin (VANCOCIN) 1,500 mg in sodium chloride 0.9 % 500 mL IVPB     1,500 mg 250 mL/hr over 120 Minutes Intravenous  Once 10/17/16 1252 10/17/16 1541   10/17/16 0000  cefTRIAXone (ROCEPHIN) 1 g in dextrose 5 % 50 mL IVPB     1 g 100 mL/hr over 30 Minutes Intravenous  Once 10/16/16 2355 10/17/16 0153   10/17/16 0000  azithromycin (ZITHROMAX) 500 mg in dextrose 5 % 250 mL IVPB     500 mg 250 mL/hr over 60 Minutes Intravenous  Once 10/16/16 2355 10/17/16 0251       Subjective: Somnolent, mumbles nonsensically.  Objective: Vitals:   10/17/16 2057 10/18/16 0544 10/18/16 1045 10/18/16 1401  BP: 138/66 135/70 (!) 146/64 (!) 141/72  Pulse: 63 73 76 77  Resp:  16 16 20 18   Temp: 99.9 F (37.7 C) 98.1 F (36.7 C)  98.1 F (36.7 C)  TempSrc: Axillary Axillary  Oral  SpO2: 97% 100% 100% 98%  Weight:  65.5 kg (144 lb 8 oz)    Height:        Intake/Output Summary (Last 24 hours) at 10/18/16 1729 Last data filed at 10/18/16 1500  Gross per 24 hour  Intake              450 ml  Output             1050 ml  Net             -600 ml   Filed Weights   10/16/16 2140 10/17/16 0408 10/18/16 0544  Weight: 65.8 kg (145 lb) 63.2 kg (139 lb 6.4 oz) 65.5 kg (144 lb 8 oz)    Examination:  General exam:  Somnolent Respiratory system: Coarse bilateral breath sounds Cardiovascular system:RRR. No murmurs, rubs, gallops. Gastrointestinal system: Abdomen is nondistended, soft and nontender. No organomegaly or masses felt. Normal bowel sounds heard. Central nervous system: Very somnolent, moves all 4 extremities spontaneously Extremities: No C/C/E, +pedal pulses Skin: No rashes, lesions or ulcers Psychiatry: Unable to assess given current mental state    Data Reviewed: I have personally reviewed following labs and imaging studies  CBC:  Recent Labs Lab 10/16/16 2212 10/17/16 0455 10/18/16 0406  WBC 20.5* 12.8* 12.1*  NEUTROABS 18.1* 12.0*  --   HGB 13.8 13.3 11.9*  HCT 42.2 39.9 35.8*  MCV 97.0 96.4 97.0  PLT 125* 96* 71*   Basic Metabolic Panel:  Recent Labs Lab 10/16/16 2212 10/17/16 0455 10/18/16 0406  NA 138 134* 142  K 4.4 3.9 4.0  CL 101 103 107  CO2 28 24 25   GLUCOSE 107* 93 75  BUN 16 17 29*  CREATININE 1.54* 1.41* 1.78*  CALCIUM 8.9 8.1* 8.0*   GFR: Estimated Creatinine Clearance: 29.4 mL/min (A) (by C-G formula based on SCr of 1.78 mg/dL (H)). Liver Function Tests:  Recent Labs Lab 10/16/16 2212 10/17/16 0455 10/18/16 0406  AST 43* 44* 49*  ALT 34 35 32  ALKPHOS 70 61 47  BILITOT 1.4* 1.3* 0.7  PROT 6.2* 5.6* 4.8*  ALBUMIN 3.4* 3.1* 2.6*   No results for input(s): LIPASE, AMYLASE in the last 168 hours. No results for input(s): AMMONIA in the last 168 hours. Coagulation Profile: No results for input(s): INR, PROTIME in the last 168 hours. Cardiac Enzymes:  Recent Labs Lab 10/16/16 2212 10/17/16 0049 10/17/16 0455 10/17/16 1004 10/17/16 1606  CKTOTAL 38*  --  64  --   --   TROPONINI 0.06* 0.07* 0.14* 0.21* 0.55*   BNP (last 3 results) No results for input(s): PROBNP in the last 8760 hours. HbA1C: No results for input(s): HGBA1C in the last 72 hours. CBG: No results for input(s): GLUCAP in the last 168 hours. Lipid Profile: No results  for input(s): CHOL, HDL, LDLCALC, TRIG, CHOLHDL, LDLDIRECT in the last 72 hours. Thyroid Function Tests:  Recent Labs  10/17/16 0455  TSH 0.937   Anemia Panel:  Recent Labs  10/17/16 1004  VITAMINB12 809  FOLATE 8.1  FERRITIN 170  TIBC 237*  IRON 12*  RETICCTPCT 1.0   Urine analysis:    Component Value Date/Time   COLORURINE YELLOW 10/16/2016 2313   APPEARANCEUR CLEAR 10/16/2016 2313   LABSPEC 1.018 10/16/2016 2313   PHURINE 7.0 10/16/2016 2313   GLUCOSEU NEGATIVE 10/16/2016 2313  HGBUR NEGATIVE 10/16/2016 2313   BILIRUBINUR NEGATIVE 10/16/2016 2313   KETONESUR NEGATIVE 10/16/2016 2313   PROTEINUR 30 (A) 10/16/2016 2313   NITRITE NEGATIVE 10/16/2016 2313   LEUKOCYTESUR NEGATIVE 10/16/2016 2313   Sepsis Labs: @LABRCNTIP (procalcitonin:4,lacticidven:4)  ) Recent Results (from the past 240 hour(s))  Urine culture     Status: None   Collection Time: 10/16/16 11:13 PM  Result Value Ref Range Status   Specimen Description URINE, CLEAN CATCH  Final   Special Requests NONE  Final   Culture   Final    NO GROWTH Performed at [facility], [address] [place], [place] [address]    Report Status 10/18/2016 FINAL  Final  Blood Culture (routine x 2)     Status: Abnormal (Preliminary result)   Collection Time: 10/17/16 12:37 AM  Result Value Ref Range Status   Specimen Description BLOOD RIGHT ARM  Final   Special Requests   Final    BOTTLES DRAWN AEROBIC AND ANAEROBIC Blood Culture adequate volume   Culture  Setup Time   Final    GRAM POSITIVE COCCI Gram Stain Report Called to,Read Back By and Verified With: BIVENS,L @ 1243 ON 7.16.18 BY BOWMAN,L IN BOTH AEROBIC AND ANAEROBIC BOTTLES    Culture (A)  Final    STAPHYLOCOCCUS AUREUS SUSCEPTIBILITIES TO FOLLOW Performed at [facility], [address], [place], [place] [address]    Report Status PENDING  Incomplete  Blood Culture ID Panel (Reflexed)     Status: Abnormal   Collection Time: 10/17/16  12:37 AM  Result Value Ref Range Status   Enterococcus species NOT DETECTED NOT DETECTED Final   Listeria monocytogenes NOT DETECTED NOT DETECTED Final   Staphylococcus species DETECTED (A) NOT DETECTED Final    Comment: CRITICAL RESULT CALLED TO, READ BACK BY AND VERIFIED WITH: Hector Brunswick RN, AT 1844 10/17/16 BY D. VANHOOK    Staphylococcus aureus DETECTED (A) NOT DETECTED Final    Comment: Methicillin (oxacillin)-resistant Staphylococcus aureus (MRSA). MRSA is predictably resistant to beta-lactam antibiotics (except ceftaroline). Preferred therapy is vancomycin unless clinically contraindicated. Patient requires contact precautions if  hospitalized. CRITICAL RESULT CALLED TO, READ BACK BY AND VERIFIED WITH: L. BIVENS RN, AT 1844 10/17/16 BY D. VANHOOK    Methicillin resistance DETECTED (A) NOT DETECTED Final    Comment: CRITICAL RESULT CALLED TO, READ BACK BY AND VERIFIED WITH: L. BIVENS RN, AT 1844 10/17/16 BY D. VANHOOK    Streptococcus species NOT DETECTED NOT DETECTED Final   Streptococcus agalactiae NOT DETECTED NOT DETECTED Final   Streptococcus pneumoniae NOT DETECTED NOT DETECTED Final   Streptococcus pyogenes NOT DETECTED NOT DETECTED Final   Acinetobacter baumannii NOT DETECTED NOT DETECTED Final   Enterobacteriaceae species NOT DETECTED NOT DETECTED Final   Enterobacter cloacae complex NOT DETECTED NOT DETECTED Final   Escherichia coli NOT DETECTED NOT DETECTED Final   Klebsiella oxytoca NOT DETECTED NOT DETECTED Final   Klebsiella pneumoniae NOT DETECTED NOT DETECTED Final   Proteus species NOT DETECTED NOT DETECTED Final   Serratia marcescens NOT DETECTED NOT DETECTED Final   Haemophilus influenzae NOT DETECTED NOT DETECTED Final   Neisseria meningitidis NOT DETECTED NOT DETECTED Final   Pseudomonas aeruginosa NOT DETECTED NOT DETECTED Final   Candida albicans NOT DETECTED NOT DETECTED Final   Candida glabrata NOT DETECTED NOT DETECTED Final   Candida krusei NOT DETECTED  NOT DETECTED Final   Candida parapsilosis NOT DETECTED NOT DETECTED Final   Candida tropicalis NOT DETECTED NOT  DETECTED Final    Comment: Performed at [facility], [address] [place], [place] [address]  Blood Culture (routine x 2)     Status: Abnormal (Preliminary result)   Collection Time: 10/17/16 12:46 AM  Result Value Ref Range Status   Specimen Description BLOOD LEFT ARM  Final   Special Requests   Final    BOTTLES DRAWN AEROBIC ONLY Blood Culture results may not be optimal due to an inadequate volume of blood received in culture bottles   Culture  Setup Time   Final    GRAM POSITIVE COCCI Gram Stain Report Called to,Read Back By and Verified With: BIVENS,L @ 1243 ON 7.16.18 BY BOWMAN,L AEROBIC BOTTLE ONLY    Culture STAPHYLOCOCCUS AUREUS (A)  Final   Report Status PENDING  Incomplete  Gram stain     Status: None   Collection Time: 10/18/16 10:34 AM  Result Value Ref Range Status   Specimen Description CSF  Final   Special Requests NONE  Final   Gram Stain   Final    CYTOSPIN SMEAR WBC SEEN WBC PRESENT,BOTH PMN AND MONONUCLEAR NO ORGANISMS SEEN   Report Status 10/18/2016 FINAL  Final         Radiology Studies: Dg Chest 2 View  Result Date: 10/16/2016 CLINICAL DATA:  Altered mental status and weakness after extended time outside. EXAM: CHEST  2 VIEW COMPARISON:  Chest radiograph June 24, 2014 FINDINGS: Cardiac silhouette is moderately enlarged. Calcified aortic knob. Similar mild chronic interstitial changes without pleural effusion or focal consolidation. Increased lung volumes. No pneumothorax. Accentuated kyphosis, chronic approximate T10 compression fracture. Osteopenia. IMPRESSION: Stable cardiomegaly and COPD. Electronically Signed   By: Awilda Metro M.D.   On: 10/16/2016 23:34   Ct Head Wo Contrast  Result Date: 10/16/2016 CLINICAL DATA:  83 year old male with altered mental status and weakness today. EXAM: CT HEAD WITHOUT CONTRAST TECHNIQUE:  Contiguous axial images were obtained from the base of the skull through the vertex without intravenous contrast. COMPARISON:  None. FINDINGS: Brain: Mild cerebral atrophy. Patchy and confluent areas of decreased attenuation are noted throughout the deep and periventricular white matter of the cerebral hemispheres bilaterally, compatible with chronic microvascular ischemic disease. No evidence of acute infarction, hemorrhage, hydrocephalus, extra-axial collection or mass lesion/mass effect. Vascular: No hyperdense vessel or unexpected calcification. Skull: Normal. Negative for fracture or focal lesion. Sinuses/Orbits: Mild multifocal mucosal thickening in the ethmoid sinuses. No acute finding. Other: None. IMPRESSION: 1. No acute intracranial abnormalities. 2. Mild cerebral atrophy with mild chronic microvascular ischemic changes in the cerebral white matter. Electronically Signed   By: Trudie Reed M.D.   On: 10/16/2016 23:24   Dg Chest Port 1 View  Result Date: 10/17/2016 CLINICAL DATA:  83 year old male with fever.  Subsequent encounter. EXAM: PORTABLE CHEST 1 VIEW COMPARISON:  10/16/2016. FINDINGS: Cardiomegaly. Pulmonary vascular congestion. No segmental consolidation. Slightly limited evaluation of retrocardiac region. Calcified slightly tortuous aorta. IMPRESSION: Cardiomegaly. Pulmonary vascular congestion. No segmental consolidation. Slightly limited evaluation of retrocardiac region. Electronically Signed   By: Lacy Duverney M.D.   On: 10/17/2016 14:42   Dg Fluoro Guide Lumbar Puncture  Result Date: 10/18/2016 CLINICAL DATA:  Fever EXAM: DIAGNOSTIC LUMBAR PUNCTURE UNDER FLUOROSCOPIC GUIDANCE FLUOROSCOPY TIME:  Fluoroscopy Time:  0 minutes 30 seconds Radiation Exposure Index (if provided by the fluoroscopic device): 4.7 mGy Number of Acquired Spot Images: 2 screen captures during fluoroscopy PROCEDURE: Procedure, benefits, and risks were discussed with the patient, including alternatives.  Patient's questions were  answered. Written informed consent was obtained by the service from the patient's son. Timeout protocol followed. Patient placed prone. L4-L5 disc space was localized under fluoroscopy. Skin prepped and draped in usual sterile fashion. Skin and soft tissues anesthetized with 2 mL of 1% lidocaine. A 22 gauge needle was advanced into the spinal canal where clear colorless CSF was encountered. 10.5 mL of CSF was obtained in 4 tubes for requested analysis. Procedure tolerated very well by patient without immediate complication. IMPRESSION: Successful fluoroscopic guided lumbar puncture as above. Electronically Signed   By: Ulyses Southward M.D.   On: 10/18/2016 11:42        Scheduled Meds: . aspirin EC  81 mg Oral Daily  . chlorhexidine  15 mL Mouth Rinse BID  . mouth rinse  15 mL Mouth Rinse q12n4p   Continuous Infusions: . acyclovir    . vancomycin Stopped (10/18/16 1157)     LOS: 1 day    Time spent: 35 minutes. Greater than 50% of this time was spent in direct contact with the patient coordinating care.     Chaya Jan, MD [facility] Pager [phone]  If 7PM-7AM, please contact night-coverage [email or web address] Password [email or web address] 10/18/2016, 5:29 PM

## 2016-10-18 NOTE — Progress Notes (Signed)
CRITICAL VALUE ALERT  Critical Value:  CSF elevated WBC of 14  Date & Time Notied:  10/18/2016 @ 12:35  Provider Notified: Dr. Ardyth HarpsHernandez  Orders Received/Actions taken: Awaiting response.

## 2016-10-18 NOTE — Procedures (Signed)
Preprocedure Dx: Fever Postprocedure Dx: Fever Procedure:  Fluoroscopically guided lumbar puncture Radiologist:  Tyron RussellBoles Anesthesia:  2 ml of 1% lidocaine Specimen:  10.5 ml CSF, clear colorless EBL:   < 1 ml Opening pressure: Not measured Complications: None

## 2016-10-19 DIAGNOSIS — I10 Essential (primary) hypertension: Secondary | ICD-10-CM

## 2016-10-19 DIAGNOSIS — N179 Acute kidney failure, unspecified: Secondary | ICD-10-CM

## 2016-10-19 DIAGNOSIS — R7881 Bacteremia: Secondary | ICD-10-CM

## 2016-10-19 DIAGNOSIS — G9341 Metabolic encephalopathy: Secondary | ICD-10-CM

## 2016-10-19 DIAGNOSIS — N183 Chronic kidney disease, stage 3 (moderate): Secondary | ICD-10-CM

## 2016-10-19 DIAGNOSIS — F0390 Unspecified dementia without behavioral disturbance: Secondary | ICD-10-CM

## 2016-10-19 DIAGNOSIS — A4102 Sepsis due to Methicillin resistant Staphylococcus aureus: Principal | ICD-10-CM

## 2016-10-19 DIAGNOSIS — I248 Other forms of acute ischemic heart disease: Secondary | ICD-10-CM

## 2016-10-19 LAB — CBC
HEMATOCRIT: 38 % — AB (ref 39.0–52.0)
Hemoglobin: 12.9 g/dL — ABNORMAL LOW (ref 13.0–17.0)
MCH: 32 pg (ref 26.0–34.0)
MCHC: 33.9 g/dL (ref 30.0–36.0)
MCV: 94.3 fL (ref 78.0–100.0)
Platelets: 60 10*3/uL — ABNORMAL LOW (ref 150–400)
RBC: 4.03 MIL/uL — ABNORMAL LOW (ref 4.22–5.81)
RDW: 13.3 % (ref 11.5–15.5)
WBC: 11.5 10*3/uL — AB (ref 4.0–10.5)

## 2016-10-19 LAB — CULTURE, BLOOD (ROUTINE X 2): Special Requests: ADEQUATE

## 2016-10-19 LAB — BASIC METABOLIC PANEL
Anion gap: 8 (ref 5–15)
BUN: 25 mg/dL — AB (ref 6–20)
CALCIUM: 8 mg/dL — AB (ref 8.9–10.3)
CO2: 24 mmol/L (ref 22–32)
Chloride: 104 mmol/L (ref 101–111)
Creatinine, Ser: 1.28 mg/dL — ABNORMAL HIGH (ref 0.61–1.24)
GFR calc Af Amer: 59 mL/min — ABNORMAL LOW (ref >60)
GFR, EST NON AFRICAN AMERICAN: 51 mL/min — AB (ref >60)
GLUCOSE: 104 mg/dL — AB (ref 65–99)
Potassium: 3.4 mmol/L — ABNORMAL LOW (ref 3.5–5.1)
SODIUM: 136 mmol/L (ref 135–145)

## 2016-10-19 MED ORDER — FUROSEMIDE 10 MG/ML IJ SOLN
40.0000 mg | Freq: Once | INTRAMUSCULAR | Status: AC
  Administered 2016-10-19: 40 mg via INTRAVENOUS
  Filled 2016-10-19: qty 4

## 2016-10-19 MED ORDER — POTASSIUM CHLORIDE 20 MEQ/15ML (10%) PO SOLN
40.0000 meq | Freq: Once | ORAL | Status: AC
  Administered 2016-10-19: 40 meq via ORAL
  Filled 2016-10-19: qty 30

## 2016-10-19 NOTE — Consult Note (Signed)
Cardiology Consultation:   Patient ID: Jon Cross; [id number]; 02-11-1933   Admit date: 10/16/2016 Date of Consult: 10/19/2016  Primary Care Provider: Joette Catching, MD Primary Cardiologist: None   Patient Profile:   Jon Cross is a 83 y.o. male with a hx of hypertension, reported h/o atrial fibrillation, and dementia who is being seen today for the evaluation of MRSA bacteremia at the request of Dr. Arbutus Leas.  History of Present Illness:   Jon Cross is an 83 yr old male who has a h/o HTN and dementia who was admitted 10/17/16 for weakness and confusion. History is obtained from the chart as the patient does not give much of a history.  He had reportedly been working in his yard and went [place] his [place] and was subsequently found to be weak and confused. He denied chest pain and shortness of breath, nausea, vomiting, and diarrhea. He does complain of a cough and did so on admission.  Blood cultures are positive for MRSA. CSF showed WBC's with neutrophilic predominance.  TTE on 10/17/16 showed normal LV systolic function, EF 50-55%, mild LVH, grade 1 diastolic dysfunction, mild aortic regurgitation, trivial pericardial effusion, and no evidence of vegetations.  ID was consulted by phone and a TEE was recommended to determine length of antibiotic therapy (2 weeks of vancomycin vs 4-6 weeks).  Chest xray on 7/16 showed pulmonary vascular congestion and he was given Lasix.  ECG on admission which I personally interpreted showed sinus rhythm, LAFB, and precordial T wave abnormalities suggestive of anterolateral ischemia.  Troponins peaked at 0.55 and trended down.   Past Medical History:  Diagnosis Date  . Alzheimer disease   . Atrial fibrillation (HCC)   . Essential hypertension, benign   . Herpes zoster   . Staphylococcal skin infection     Past Surgical History:  Procedure Laterality Date  . INGUINAL HERNIA REPAIR    . TIBIA FRACTURE SURGERY     Open  reduction and internal fixation of left tibia fracture  . TONSILLECTOMY    . UMBILICAL HERNIA REPAIR       Inpatient Medications: Scheduled Meds: . aspirin EC  81 mg Oral Daily  . chlorhexidine  15 mL Mouth Rinse BID  . mouth rinse  15 mL Mouth Rinse q12n4p   Continuous Infusions: . acyclovir Stopped (10/19/16 0627)  . vancomycin Stopped (10/19/16 1227)   PRN Meds: acetaminophen **OR** acetaminophen  Allergies:    Allergies  Allergen Reactions  . Dristan Cold [Chlorphen-Pe-Acetaminophen] Nausea Only  . Sulfonamide Derivatives Nausea Only    Social History:   Social History   Social History  . Marital status: Single    Spouse name: N/A  . Number of children: N/A  . Years of education: N/A   Occupational History  . Not on file.   Social History Main Topics  . Smoking status: Current Every Day Smoker    Packs/day: 1.00    Years: 31.00    Types: Cigarettes  . Smokeless tobacco: Never Used     Comment: quit smoking for 20 years  . Alcohol use Yes  . Drug use: No  . Sexual activity: Not on file   Other Topics Concern  . Not on file   Social History Narrative  . No narrative on file    Family History:   The patient's family history includes Cancer in his unknown relative.  ROS:  Please see the history of present illness.  ROS  All other ROS reviewed  and negative.     Physical Exam/Data:   Vitals:   10/18/16 2038 10/18/16 2225 10/19/16 0552 10/19/16 1412  BP:  (!) 165/70 (!) 156/88 139/69  Pulse:  90 83 77  Resp:   16 16  Temp:  (!) 100.9 F (38.3 C) 98 F (36.7 C) 99.3 F (37.4 C)  TempSrc:  Oral Oral Oral  SpO2: (!) 89% 100% 96% 98%  Weight:      Height:        Intake/Output Summary (Last 24 hours) at 10/19/16 1709 Last data filed at 10/19/16 1549  Gross per 24 hour  Intake            426.4 ml  Output             1850 ml  Net          -1423.6 ml   Filed Weights   10/16/16 2140 10/17/16 0408 10/18/16 0544  Weight: 145 lb (65.8 kg) 139  lb 6.4 oz (63.2 kg) 144 lb 8 oz (65.5 kg)   Body mass index is 23.32 kg/m.  General:  Well nourished, well developed, in no acute distress HEENT: normal Lymph: no adenopathy Neck: no JVD Endocrine:  No thryomegaly Cardiac:  normal S1, S2; RRR; no murmur Lungs:  Bilateral wheezes and rhonchi  Abd: soft, nontender, no hepatomegaly  Ext: no edema Musculoskeletal:  No deformities Skin: warm and dry  Neuro:  No focal deficits   Telemetry:  Telemetry was personally reviewed and demonstrates:  Sinus rhythm/sinus tach with PAC's and PVC's  Relevant CV Studies: Echocardiogram reviewed above  Laboratory Data:  Chemistry Recent Labs Lab 10/17/16 0455 10/18/16 0406 10/19/16 0641  NA 134* 142 136  K 3.9 4.0 3.4*  CL 103 107 104  CO2 24 25 24   GLUCOSE 93 75 104*  BUN 17 29* 25*  CREATININE 1.41* 1.78* 1.28*  CALCIUM 8.1* 8.0* 8.0*  GFRNONAA 45* 34* 51*  GFRAA 52* 39* 59*  ANIONGAP 7 10 8      Recent Labs Lab 10/16/16 2212 10/17/16 0455 10/18/16 0406  PROT 6.2* 5.6* 4.8*  ALBUMIN 3.4* 3.1* 2.6*  AST 43* 44* 49*  ALT 34 35 32  ALKPHOS 70 61 47  BILITOT 1.4* 1.3* 0.7   Hematology Recent Labs Lab 10/17/16 0455 10/17/16 1004 10/18/16 0406 10/19/16 0641  WBC 12.8*  --  12.1* 11.5*  RBC 4.14* 4.20* 3.69* 4.03*  HGB 13.3  --  11.9* 12.9*  HCT 39.9  --  35.8* 38.0*  MCV 96.4  --  97.0 94.3  MCH 32.1  --  32.2 32.0  MCHC 33.3  --  33.2 33.9  RDW 13.3  --  13.7 13.3  PLT 96*  --  71* 60*   Cardiac Enzymes Recent Labs Lab 10/16/16 2212 10/17/16 0049 10/17/16 0455 10/17/16 1004 10/17/16 1606  TROPONINI 0.06* 0.07* 0.14* 0.21* 0.55*   No results for input(s): TROPIPOC in the last 168 hours.  BNPNo results for input(s): BNP, PROBNP in the last 168 hours.  DDimer No results for input(s): DDIMER in the last 168 hours.  Radiology/Studies:  Dg Chest 2 View  Result Date: 10/16/2016 CLINICAL DATA:  Altered mental status and weakness after extended time outside.  EXAM: CHEST  2 VIEW COMPARISON:  Chest radiograph June 24, 2014 FINDINGS: Cardiac silhouette is moderately enlarged. Calcified aortic knob. Similar mild chronic interstitial changes without pleural effusion or focal consolidation. Increased lung volumes. No pneumothorax. Accentuated kyphosis, chronic approximate T10 compression fracture. Osteopenia. IMPRESSION: Stable  cardiomegaly and COPD. Electronically Signed   By: Awilda Metro M.D.   On: 10/16/2016 23:34   Ct Head Wo Contrast  Result Date: 10/16/2016 CLINICAL DATA:  83 year old male with altered mental status and weakness today. EXAM: CT HEAD WITHOUT CONTRAST TECHNIQUE: Contiguous axial images were obtained from the base of the skull through the vertex without intravenous contrast. COMPARISON:  None. FINDINGS: Brain: Mild cerebral atrophy. Patchy and confluent areas of decreased attenuation are noted throughout the deep and periventricular white matter of the cerebral hemispheres bilaterally, compatible with chronic microvascular ischemic disease. No evidence of acute infarction, hemorrhage, hydrocephalus, extra-axial collection or mass lesion/mass effect. Vascular: No hyperdense vessel or unexpected calcification. Skull: Normal. Negative for fracture or focal lesion. Sinuses/Orbits: Mild multifocal mucosal thickening in the ethmoid sinuses. No acute finding. Other: None. IMPRESSION: 1. No acute intracranial abnormalities. 2. Mild cerebral atrophy with mild chronic microvascular ischemic changes in the cerebral white matter. Electronically Signed   By: Trudie Reed M.D.   On: 10/16/2016 23:24   Dg Chest Port 1 View  Result Date: 10/17/2016 CLINICAL DATA:  83 year old male with fever.  Subsequent encounter. EXAM: PORTABLE CHEST 1 VIEW COMPARISON:  10/16/2016. FINDINGS: Cardiomegaly. Pulmonary vascular congestion. No segmental consolidation. Slightly limited evaluation of retrocardiac region. Calcified slightly tortuous aorta. IMPRESSION:  Cardiomegaly. Pulmonary vascular congestion. No segmental consolidation. Slightly limited evaluation of retrocardiac region. Electronically Signed   By: Lacy Duverney M.D.   On: 10/17/2016 14:42   Dg Fluoro Guide Lumbar Puncture  Result Date: 10/18/2016 CLINICAL DATA:  Fever EXAM: DIAGNOSTIC LUMBAR PUNCTURE UNDER FLUOROSCOPIC GUIDANCE FLUOROSCOPY TIME:  Fluoroscopy Time:  0 minutes 30 seconds Radiation Exposure Index (if provided by the fluoroscopic device): 4.7 mGy Number of Acquired Spot Images: 2 screen captures during fluoroscopy PROCEDURE: Procedure, benefits, and risks were discussed with the patient, including alternatives. Patient's questions were answered. Written informed consent was obtained by the service from the patient's son. Timeout protocol followed. Patient placed prone. L4-L5 disc space was localized under fluoroscopy. Skin prepped and draped in usual sterile fashion. Skin and soft tissues anesthetized with 2 mL of 1% lidocaine. A 22 gauge needle was advanced into the spinal canal where clear colorless CSF was encountered. 10.5 mL of CSF was obtained in 4 tubes for requested analysis. Procedure tolerated very well by patient without immediate complication. IMPRESSION: Successful fluoroscopic guided lumbar puncture as above. Electronically Signed   By: Ulyses Southward M.D.   On: 10/18/2016 11:42    Assessment and Plan:   1. MRSA bacteremia: In order to help determine length of antibiotic therapy, I will arrange for TEE tomorrow with anesthesia assistance.  2. HTN: Controlled.  3. Troponin elevation: Likely indicative of demand ischemia in context of sepsis. Currently on ASA. Continue to monitor platelets given thrombocytopenia.   Signed, Prentice Docker, MD  10/19/2016 5:09 PM

## 2016-10-19 NOTE — Progress Notes (Addendum)
PROGRESS NOTE  Jon Cross RUE:[id number] DOB: 12-15-1932 DOA: 10/16/2016 PCP: Joette Catching, MD  Brief History:  83 year old male with a history of Alzheimer's dementia, CKD stage III, atrial fibrillation presented with fevers and confusion. Apparently, the patient was working outside in his yard. Later in the day, the patient was found to have altered mental status with generalized weakness. The patient was found to have temperature 100.64F with WBC 20.5 at the time of admission. Blood cultures are growing MRSA. The patient was started on vancomycin. Lumbar culture was performed on 10/18/2016 and showed WBC 14 with 80% neutrophils. The patient was started on empiric acyclovir pending results of his HSV PCR  Assessment/Plan: Sepsis -Secondary to bacteremia -Lactic acid 1.7 -Continue vancomycin -Fluid saline locked secondary to volume overload -WBC trending down  MRSA bacteremia -Source unclear -10/17/16 TTE--EF 50-55%, grade 1 DD, mild AI/TR, trivial pericardial effusion -Surveillance blood cultures -Consult cardiology for TEE, discussed with Dr. Purvis Sheffield -make npo after MN  Acute metabolic encephalopathy -Secondary to infectious process -Patient remains confused but slowly improving -10/18/2016 lumbar puncture cultures negative - continue acyclovir pending HSV PCR results  Fluid overload -lasix IV x 1 today -personally reviewed CXR--increase pulm vascular congestion  Elevated troponin -due to demand ischemia -no chest pain -personally reviewed EKG--sinus with T wave inversion V4-V6 without change  Acute on chronic renal failure--CKD stage III -Secondary to sepsis and volume depletion -Baseline creatinine 1.2-1.4 -Serum creatinine. 1.78  Dementia without behavioral disturbance -According to the patient's primary care note on 08/17/2016, he was on Namenda and Aricept -Unclear if the patient was taking is properly -Restart Namenda 5 mg daily and Aricept 5  mg daily when more alert  B12 deficiency -Restart supplementation  Hypokalemia -replete  -check mag  Goals of Care -palliative medicine following -full scope of treatment    Disposition Plan:   Home vs  SNF in 3-4 days  Family Communication:   Son updated on phone  Consultants:  cardiology  Code Status:  FULL   DVT Prophylaxis:  Adwolf Heparin / Glendo Lovenox   Procedures: As Listed in Progress Note Above  Antibiotics: Azithromycin 7/16 Ceftriaxone 7/16-7/17 Vancomycin 7/16>>> Acyclovir 7/17>>>    Subjective:  patient is pleasantly confused. Review of systems is limited. The patient denies any headache, chest pain, short of breath, nausea, vomiting, diarrhea, abdominal pain. No dysuria or hematuria pain or rashes.  Objective: Vitals:   10/18/16 2038 10/18/16 2225 10/19/16 0552 10/19/16 1412  BP:  (!) 165/70 (!) 156/88 139/69  Pulse:  90 83 77  Resp:   16 16  Temp:  (!) 100.9 F (38.3 C) 98 F (36.7 C) 99.3 F (37.4 C)  TempSrc:  Oral Oral Oral  SpO2: (!) 89% 100% 96% 98%  Weight:      Height:        Intake/Output Summary (Last 24 hours) at 10/19/16 1656 Last data filed at 10/19/16 1549  Gross per 24 hour  Intake            426.4 ml  Output             1850 ml  Net          -1423.6 ml   Weight change:  Exam:   General:  Pt is alert, does not follow commands appropriately, not in acute distress  HEENT: No icterus, No thrush, No neck mass, [place]/AT  Cardiovascular: RRR, S1/S2, no rubs, no gallops  Respiratory: Bilateral  scattered rhonchi. No wheezing.   Abdomen: Soft/+BS, non tender, non distended, no guarding  Extremities: No edema, No lymphangitis, No petechiae, No rashes, no synovitis   Data Reviewed: I have personally reviewed following labs and imaging studies Basic Metabolic Panel:  Recent Labs Lab 10/16/16 2212 10/17/16 0455 10/18/16 0406 10/19/16 0641  NA 138 134* 142 136  K 4.4 3.9 4.0 3.4*  CL 101 103 107 104  CO2 28 24 25 24     GLUCOSE 107* 93 75 104*  BUN 16 17 29* 25*  CREATININE 1.54* 1.41* 1.78* 1.28*  CALCIUM 8.9 8.1* 8.0* 8.0*   Liver Function Tests:  Recent Labs Lab 10/16/16 2212 10/17/16 0455 10/18/16 0406  AST 43* 44* 49*  ALT 34 35 32  ALKPHOS 70 61 47  BILITOT 1.4* 1.3* 0.7  PROT 6.2* 5.6* 4.8*  ALBUMIN 3.4* 3.1* 2.6*   No results for input(s): LIPASE, AMYLASE in the last 168 hours. No results for input(s): AMMONIA in the last 168 hours. Coagulation Profile: No results for input(s): INR, PROTIME in the last 168 hours. CBC:  Recent Labs Lab 10/16/16 2212 10/17/16 0455 10/18/16 0406 10/19/16 0641  WBC 20.5* 12.8* 12.1* 11.5*  NEUTROABS 18.1* 12.0*  --   --   HGB 13.8 13.3 11.9* 12.9*  HCT 42.2 39.9 35.8* 38.0*  MCV 97.0 96.4 97.0 94.3  PLT 125* 96* 71* 60*   Cardiac Enzymes:  Recent Labs Lab 10/16/16 2212 10/17/16 0049 10/17/16 0455 10/17/16 1004 10/17/16 1606  CKTOTAL 38*  --  64  --   --   TROPONINI 0.06* 0.07* 0.14* 0.21* 0.55*   BNP: Invalid input(s): POCBNP CBG: No results for input(s): GLUCAP in the last 168 hours. HbA1C: No results for input(s): HGBA1C in the last 72 hours. Urine analysis:    Component Value Date/Time   COLORURINE YELLOW 10/16/2016 2313   APPEARANCEUR CLEAR 10/16/2016 2313   LABSPEC 1.018 10/16/2016 2313   PHURINE 7.0 10/16/2016 2313   GLUCOSEU NEGATIVE 10/16/2016 2313   HGBUR NEGATIVE 10/16/2016 2313   BILIRUBINUR NEGATIVE 10/16/2016 2313   KETONESUR NEGATIVE 10/16/2016 2313   PROTEINUR 30 (A) 10/16/2016 2313   NITRITE NEGATIVE 10/16/2016 2313   LEUKOCYTESUR NEGATIVE 10/16/2016 2313   Sepsis Labs: @LABRCNTIP (procalcitonin:4,lacticidven:4) ) Recent Results (from the past 240 hour(s))  Urine culture     Status: None   Collection Time: 10/16/16 11:13 PM  Result Value Ref Range Status   Specimen Description URINE, CLEAN CATCH  Final   Special Requests NONE  Final   Culture   Final    NO GROWTH Performed at [facility]  Lab, [address] [place], [place] [address]    Report Status 10/18/2016 FINAL  Final  Blood Culture (routine x 2)     Status: Abnormal   Collection Time: 10/17/16 12:37 AM  Result Value Ref Range Status   Specimen Description BLOOD RIGHT ARM  Final   Special Requests   Final    BOTTLES DRAWN AEROBIC AND ANAEROBIC Blood Culture adequate volume   Culture  Setup Time   Final    GRAM POSITIVE COCCI Gram Stain Report Called to,Read Back By and Verified With: BIVENS,L @ 1243 ON 7.16.18 BY BOWMAN,L IN BOTH AEROBIC AND ANAEROBIC BOTTLES    Culture METHICILLIN RESISTANT STAPHYLOCOCCUS AUREUS (A)  Final   Report Status 10/19/2016 FINAL  Final   Organism ID, Bacteria METHICILLIN RESISTANT STAPHYLOCOCCUS AUREUS  Final      Susceptibility   Methicillin resistant staphylococcus aureus - MIC*  CIPROFLOXACIN <=0.5 SENSITIVE Sensitive     ERYTHROMYCIN >=8 RESISTANT Resistant     GENTAMICIN <=0.5 SENSITIVE Sensitive     OXACILLIN >=4 RESISTANT Resistant     TETRACYCLINE <=1 SENSITIVE Sensitive     VANCOMYCIN 1 SENSITIVE Sensitive     TRIMETH/SULFA <=10 SENSITIVE Sensitive     CLINDAMYCIN <=0.25 SENSITIVE Sensitive     RIFAMPIN <=0.5 SENSITIVE Sensitive     Inducible Clindamycin NEGATIVE Sensitive     * METHICILLIN RESISTANT STAPHYLOCOCCUS AUREUS  Blood Culture ID Panel (Reflexed)     Status: Abnormal   Collection Time: 10/17/16 12:37 AM  Result Value Ref Range Status   Enterococcus species NOT DETECTED NOT DETECTED Final   Listeria monocytogenes NOT DETECTED NOT DETECTED Final   Staphylococcus species DETECTED (A) NOT DETECTED Final    Comment: CRITICAL RESULT CALLED TO, READ BACK BY AND VERIFIED WITH: Hector Brunswick RN, AT 1844 10/17/16 BY D. VANHOOK    Staphylococcus aureus DETECTED (A) NOT DETECTED Final    Comment: Methicillin (oxacillin)-resistant Staphylococcus aureus (MRSA). MRSA is predictably resistant to beta-lactam antibiotics (except ceftaroline). Preferred therapy is vancomycin unless  clinically contraindicated. Patient requires contact precautions if  hospitalized. CRITICAL RESULT CALLED TO, READ BACK BY AND VERIFIED WITH: L. BIVENS RN, AT 1844 10/17/16 BY D. VANHOOK    Methicillin resistance DETECTED (A) NOT DETECTED Final    Comment: CRITICAL RESULT CALLED TO, READ BACK BY AND VERIFIED WITH: L. BIVENS RN, AT 1844 10/17/16 BY D. VANHOOK    Streptococcus species NOT DETECTED NOT DETECTED Final   Streptococcus agalactiae NOT DETECTED NOT DETECTED Final   Streptococcus pneumoniae NOT DETECTED NOT DETECTED Final   Streptococcus pyogenes NOT DETECTED NOT DETECTED Final   Acinetobacter baumannii NOT DETECTED NOT DETECTED Final   Enterobacteriaceae species NOT DETECTED NOT DETECTED Final   Enterobacter cloacae complex NOT DETECTED NOT DETECTED Final   Escherichia coli NOT DETECTED NOT DETECTED Final   Klebsiella oxytoca NOT DETECTED NOT DETECTED Final   Klebsiella pneumoniae NOT DETECTED NOT DETECTED Final   Proteus species NOT DETECTED NOT DETECTED Final   Serratia marcescens NOT DETECTED NOT DETECTED Final   Haemophilus influenzae NOT DETECTED NOT DETECTED Final   Neisseria meningitidis NOT DETECTED NOT DETECTED Final   Pseudomonas aeruginosa NOT DETECTED NOT DETECTED Final   Candida albicans NOT DETECTED NOT DETECTED Final   Candida glabrata NOT DETECTED NOT DETECTED Final   Candida krusei NOT DETECTED NOT DETECTED Final   Candida parapsilosis NOT DETECTED NOT DETECTED Final   Candida tropicalis NOT DETECTED NOT DETECTED Final    Comment: Performed at [facility] Lab, [address] [place], [place] [address]  Blood Culture (routine x 2)     Status: Abnormal   Collection Time: 10/17/16 12:46 AM  Result Value Ref Range Status   Specimen Description BLOOD LEFT ARM  Final   Special Requests   Final    BOTTLES DRAWN AEROBIC ONLY Blood Culture results may not be optimal due to an inadequate volume of blood received in culture bottles   Culture  Setup Time   Final     GRAM POSITIVE COCCI Gram Stain Report Called to,Read Back By and Verified With: BIVENS,L @ 1243 ON 7.16.18 BY BOWMAN,L AEROBIC BOTTLE ONLY    Culture (A)  Final    STAPHYLOCOCCUS AUREUS SUSCEPTIBILITIES PERFORMED ON PREVIOUS CULTURE WITHIN THE LAST 5 DAYS. Performed at [facility] Lab, [address], [place], [place] [address]    Report Status 10/19/2016 FINAL  Final  Gram stain     Status: None   Collection Time: 10/18/16 10:34 AM  Result Value Ref Range Status   Specimen Description CSF  Final   Special Requests NONE  Final   Gram Stain   Final    CYTOSPIN SMEAR WBC SEEN WBC PRESENT,BOTH PMN AND MONONUCLEAR NO ORGANISMS SEEN   Report Status 10/18/2016 FINAL  Final  CSF culture     Status: None (Preliminary result)   Collection Time: 10/18/16 10:34 AM  Result Value Ref Range Status   Specimen Description CSF  Final   Special Requests NONE  Final   Gram Stain   Final    CYTOSPIN SMEAR NO ORGANISMS SEEN WBC SEEN WBC PRESENT,BOTH PMN AND MONONUCLEAR    Culture   Final    NO GROWTH Performed at [facility] Lab, [address], [place], [place] [address]    Report Status PENDING  Incomplete  Culture, blood (Routine X 2) w Reflex to ID Panel     Status: None (Preliminary result)   Collection Time: 10/19/16  6:41 AM  Result Value Ref Range Status   Specimen Description BLOOD RIGHT ARM  Final   Special Requests Blood Culture adequate volume  Final   Culture PENDING  Incomplete   Report Status PENDING  Incomplete  Culture, blood (Routine X 2) w Reflex to ID Panel     Status: None (Preliminary result)   Collection Time: 10/19/16  6:52 AM  Result Value Ref Range Status   Specimen Description BLOOD RIGHT HAND  Final   Special Requests Blood Culture adequate volume  Final   Culture PENDING  Incomplete   Report Status PENDING  Incomplete     Scheduled Meds: . aspirin EC  81 mg Oral Daily  . chlorhexidine  15 mL Mouth Rinse BID  . mouth rinse  15 mL Mouth Rinse q12n4p    Continuous Infusions: . acyclovir Stopped (10/19/16 0627)  . vancomycin Stopped (10/19/16 1227)    Procedures/Studies: Dg Chest 2 View  Result Date: 10/16/2016 CLINICAL DATA:  Altered mental status and weakness after extended time outside. EXAM: CHEST  2 VIEW COMPARISON:  Chest radiograph June 24, 2014 FINDINGS: Cardiac silhouette is moderately enlarged. Calcified aortic knob. Similar mild chronic interstitial changes without pleural effusion or focal consolidation. Increased lung volumes. No pneumothorax. Accentuated kyphosis, chronic approximate T10 compression fracture. Osteopenia. IMPRESSION: Stable cardiomegaly and COPD. Electronically Signed   By: Awilda Metro M.D.   On: 10/16/2016 23:34   Ct Head Wo Contrast  Result Date: 10/16/2016 CLINICAL DATA:  83 year old male with altered mental status and weakness today. EXAM: CT HEAD WITHOUT CONTRAST TECHNIQUE: Contiguous axial images were obtained from the base of the skull through the vertex without intravenous contrast. COMPARISON:  None. FINDINGS: Brain: Mild cerebral atrophy. Patchy and confluent areas of decreased attenuation are noted throughout the deep and periventricular white matter of the cerebral hemispheres bilaterally, compatible with chronic microvascular ischemic disease. No evidence of acute infarction, hemorrhage, hydrocephalus, extra-axial collection or mass lesion/mass effect. Vascular: No hyperdense vessel or unexpected calcification. Skull: Normal. Negative for fracture or focal lesion. Sinuses/Orbits: Mild multifocal mucosal thickening in the ethmoid sinuses. No acute finding. Other: None. IMPRESSION: 1. No acute intracranial abnormalities. 2. Mild cerebral atrophy with mild chronic microvascular ischemic changes in the cerebral white matter. Electronically Signed   By: Trudie Reed M.D.   On: 10/16/2016 23:24   Dg Chest Port 1 View  Result Date: 10/17/2016 CLINICAL DATA:  83 year old male with fever.  Subsequent  encounter. EXAM: PORTABLE CHEST 1 VIEW COMPARISON:  10/16/2016. FINDINGS: Cardiomegaly. Pulmonary vascular congestion. No segmental consolidation. Slightly limited evaluation of retrocardiac region. Calcified slightly tortuous aorta. IMPRESSION: Cardiomegaly. Pulmonary vascular congestion. No segmental consolidation. Slightly limited evaluation of retrocardiac region. Electronically Signed   By: Lacy DuverneySteven  Olson M.D.   On: 10/17/2016 14:42   Dg Fluoro Guide Lumbar Puncture  Result Date: 10/18/2016 CLINICAL DATA:  Fever EXAM: DIAGNOSTIC LUMBAR PUNCTURE UNDER FLUOROSCOPIC GUIDANCE FLUOROSCOPY TIME:  Fluoroscopy Time:  0 minutes 30 seconds Radiation Exposure Index (if provided by the fluoroscopic device): 4.7 mGy Number of Acquired Spot Images: 2 screen captures during fluoroscopy PROCEDURE: Procedure, benefits, and risks were discussed with the patient, including alternatives. Patient's questions were answered. Written informed consent was obtained by the service from the patient's son. Timeout protocol followed. Patient placed prone. L4-L5 disc space was localized under fluoroscopy. Skin prepped and draped in usual sterile fashion. Skin and soft tissues anesthetized with 2 mL of 1% lidocaine. A 22 gauge needle was advanced into the spinal canal where clear colorless CSF was encountered. 10.5 mL of CSF was obtained in 4 tubes for requested analysis. Procedure tolerated very well by patient without immediate complication. IMPRESSION: Successful fluoroscopic guided lumbar puncture as above. Electronically Signed   By: Ulyses SouthwardMark  Boles M.D.   On: 10/18/2016 11:42    Alyza Artiaga, DO  [facility] Pager [phone]  If 7PM-7AM, please contact night-coverage [email or web address] Password [email or web address] 10/19/2016, 4:56 PM   LOS: 2 days

## 2016-10-19 NOTE — Progress Notes (Signed)
  Speech Language Pathology Treatment: Dysphagia  Patient Details Name: Jon Cross MRN: [id number] DOB: 03/05/1933 Today's Date: 10/19/2016 Time: 1002-1020 SLP Time Calculation (min) (ACUTE ONLY): 18 min  Assessment / Plan / Recommendation Clinical Impression  Skilled observation of consumption of Dysphagia 1 (puree)/thin liquids with small bites/sips with limited consumption noted; pt confused with frequent verbal/visual cues to follow swallowing precautions of small bites/sips without verbalizing during intake; multiple swallows and immediate cough noted with larger volume of thin liquids, improved with smaller sips, but pt refused after two swallows of thin liquids during session.  Pt may benefit from nectar-thickened liquids until mentation improves and/or full supervision with staff during intake recommended to limit and/or eliminate aspiration risk.  ST will continue to f/u while pt in house for diet tolerance and possible upgrade as mentation improves.   HPI HPI: Jon SilenceGeorge W Mooreis a 83 y.o.malewith history of dementia, chronic kidney disease stage II A. fib as per the charts was brought to the ER on 10/16/16. Patient was very weak and mildly confused. As per the report patient has been working in his yard  and [place] when patient was found to be weak and confused. Patient has not complained of any chest pain or shortness of breath nausea vomiting or diarrhea. Has been having persistent cough.In the ER patient is being found to have productive cough is oriented to his name only. Chest x-ray on 10/17/16 showed pulmonary vascular congestion. EKG showssinus tachycardia.CT of the head was unremarkable. BSE completed and recommended Dysphagia 1/thin liquids d/t cognitive changes.      SLP Plan  Continue with current plan of care       Recommendations  Diet recommendations: Dysphagia 1 (puree);Thin liquid and/or nectar-thick liquids prn for decreased cognitive  status Liquids provided via: Cup;No straw (when alert) Medication Administration: Crushed with puree Supervision: Full supervision/cueing for compensatory strategies Compensations: Minimize environmental distractions;Slow rate;Small sips/bites;Other (Comment) (decrease volume of liquid consumption) Postural Changes and/or Swallow Maneuvers: Seated upright 90 degrees                Oral Care Recommendations: Oral care BID Follow up Recommendations:  (TBD) SLP Visit Diagnosis: Dysphagia, oropharyngeal phase (R13.12) Plan: Continue with current plan of care                       Tressie StalkerPat Lashia Niese, M.S., CCC-SLP 10/19/2016, 11:10 AM

## 2016-10-19 NOTE — Progress Notes (Signed)
CCMD called about pt having moments of sinus tach, pt resting in bed, denies chest pain or s/s of distress or discomfort. Will continue to monitor.

## 2016-10-20 ENCOUNTER — Inpatient Hospital Stay (HOSPITAL_COMMUNITY): Payer: Medicare HMO | Admitting: Anesthesiology

## 2016-10-20 ENCOUNTER — Encounter (HOSPITAL_COMMUNITY): Payer: Self-pay | Admitting: *Deleted

## 2016-10-20 ENCOUNTER — Inpatient Hospital Stay (HOSPITAL_COMMUNITY): Payer: Medicare HMO

## 2016-10-20 ENCOUNTER — Encounter (HOSPITAL_COMMUNITY)
Admission: EM | Disposition: A | Discharge status: Skilled Nursing Facility | Payer: Self-pay | Source: Home / Self Care | Attending: Internal Medicine

## 2016-10-20 DIAGNOSIS — I351 Nonrheumatic aortic (valve) insufficiency: Secondary | ICD-10-CM

## 2016-10-20 DIAGNOSIS — I38 Endocarditis, valve unspecified: Secondary | ICD-10-CM

## 2016-10-20 HISTORY — PX: TEE WITHOUT CARDIOVERSION: SHX5443

## 2016-10-20 LAB — CBC
HCT: 38.4 % — ABNORMAL LOW (ref 39.0–52.0)
Hemoglobin: 13.1 g/dL (ref 13.0–17.0)
MCH: 32 pg (ref 26.0–34.0)
MCHC: 34.1 g/dL (ref 30.0–36.0)
MCV: 93.7 fL (ref 78.0–100.0)
PLATELETS: 64 10*3/uL — AB (ref 150–400)
RBC: 4.1 MIL/uL — AB (ref 4.22–5.81)
RDW: 13.2 % (ref 11.5–15.5)
WBC: 9.7 10*3/uL (ref 4.0–10.5)

## 2016-10-20 LAB — BASIC METABOLIC PANEL
Anion gap: 11 (ref 5–15)
BUN: 28 mg/dL — AB (ref 6–20)
CALCIUM: 8.2 mg/dL — AB (ref 8.9–10.3)
CO2: 26 mmol/L (ref 22–32)
CREATININE: 1.39 mg/dL — AB (ref 0.61–1.24)
Chloride: 101 mmol/L (ref 101–111)
GFR, EST AFRICAN AMERICAN: 53 mL/min — AB (ref >60)
GFR, EST NON AFRICAN AMERICAN: 46 mL/min — AB (ref >60)
Glucose, Bld: 106 mg/dL — ABNORMAL HIGH (ref 65–99)
Potassium: 3.4 mmol/L — ABNORMAL LOW (ref 3.5–5.1)
SODIUM: 138 mmol/L (ref 135–145)

## 2016-10-20 LAB — PATHOLOGIST SMEAR REVIEW: PATH REVIEW: INCREASED

## 2016-10-20 LAB — MAGNESIUM: Magnesium: 1.7 mg/dL (ref 1.7–2.4)

## 2016-10-20 LAB — HERPES SIMPLEX VIRUS(HSV) DNA BY PCR
HSV 1 DNA: NEGATIVE
HSV 2 DNA: NEGATIVE

## 2016-10-20 SURGERY — ECHOCARDIOGRAM, TRANSESOPHAGEAL
Anesthesia: Monitor Anesthesia Care

## 2016-10-20 MED ORDER — PROPOFOL 500 MG/50ML IV EMUL
INTRAVENOUS | Status: DC | PRN
  Administered 2016-10-20: 12 ug/kg/min via INTRAVENOUS

## 2016-10-20 MED ORDER — BUTAMBEN-TETRACAINE-BENZOCAINE 2-2-14 % EX AERO
INHALATION_SPRAY | CUTANEOUS | Status: AC
Start: 2016-10-20 — End: 2016-10-20
  Filled 2016-10-20: qty 5

## 2016-10-20 MED ORDER — FENTANYL CITRATE (PF) 100 MCG/2ML IJ SOLN
INTRAMUSCULAR | Status: AC
  Filled 2016-10-20: qty 2

## 2016-10-20 MED ORDER — PROPOFOL 10 MG/ML IV BOLUS
INTRAVENOUS | Status: DC | PRN
  Administered 2016-10-20 (×2): 10 mg via INTRAVENOUS

## 2016-10-20 MED ORDER — MIDAZOLAM HCL 2 MG/2ML IJ SOLN
1.0000 mg | INTRAMUSCULAR | Status: AC
  Administered 2016-10-20: 2 mg via INTRAVENOUS

## 2016-10-20 MED ORDER — SODIUM CHLORIDE BACTERIOSTATIC 0.9 % IJ SOLN
INTRAMUSCULAR | Status: AC
  Filled 2016-10-20: qty 20

## 2016-10-20 MED ORDER — VITAMIN B-12 1000 MCG PO TABS
500.0000 ug | ORAL_TABLET | Freq: Every day | ORAL | Status: DC
  Administered 2016-10-21 – 2016-10-26 (×6): 500 ug via ORAL
  Filled 2016-10-20 (×6): qty 1

## 2016-10-20 MED ORDER — MAGNESIUM SULFATE 50 % IJ SOLN
2.0000 g | Freq: Once | INTRAVENOUS | Status: AC
  Administered 2016-10-20: 2 g via INTRAVENOUS
  Filled 2016-10-20: qty 4

## 2016-10-20 MED ORDER — PROPOFOL 10 MG/ML IV BOLUS
INTRAVENOUS | Status: AC
  Filled 2016-10-20: qty 20

## 2016-10-20 MED ORDER — FENTANYL CITRATE (PF) 100 MCG/2ML IJ SOLN
25.0000 ug | Freq: Once | INTRAMUSCULAR | Status: DC
Start: 2016-10-20 — End: 2016-10-20

## 2016-10-20 MED ORDER — POTASSIUM CHLORIDE CRYS ER 20 MEQ PO TBCR
20.0000 meq | EXTENDED_RELEASE_TABLET | Freq: Once | ORAL | Status: AC
  Administered 2016-10-20: 20 meq via ORAL
  Filled 2016-10-20: qty 1

## 2016-10-20 MED ORDER — LACTATED RINGERS IV SOLN
INTRAVENOUS | Status: DC
  Administered 2016-10-20: 10:00:00 via INTRAVENOUS

## 2016-10-20 MED ORDER — MIDAZOLAM HCL 2 MG/2ML IJ SOLN
INTRAMUSCULAR | Status: AC
  Filled 2016-10-20: qty 2

## 2016-10-20 MED ORDER — LIDOCAINE VISCOUS 2 % MT SOLN
OROMUCOSAL | Status: AC
  Filled 2016-10-20: qty 15

## 2016-10-20 MED ORDER — PROPOFOL 10 MG/ML IV BOLUS: INTRAVENOUS | Status: DC | PRN

## 2016-10-20 NOTE — Anesthesia Procedure Notes (Signed)
Procedure Name: MAC Date/Time: 10/20/2016 10:50 AM Performed by: Vista Deck Pre-anesthesia Checklist: Patient identified, Emergency Drugs available, Suction available, Timeout performed and Patient being monitored Patient Re-evaluated:Patient Re-evaluated prior to induction Oxygen Delivery Method: Non-rebreather mask

## 2016-10-20 NOTE — Anesthesia Preprocedure Evaluation (Addendum)
Anesthesia Evaluation  Patient identified by MRN, date of birth, ID band Patient awake    Reviewed: Allergy & Precautions, NPO status , Patient's Chart, lab work & pertinent test results  Airway Mallampati: I  TM Distance: >3 FB     Dental  (+) Edentulous Upper, Edentulous Lower   Pulmonary Current Smoker (prod cough),   Prod cough   breath sounds clear to auscultation       Cardiovascular hypertension, Pt. on medications + dysrhythmias  Rhythm:Regular Rate:Normal     Neuro/Psych PSYCHIATRIC DISORDERS (Alzheimer disease)    GI/Hepatic   Endo/Other    Renal/GU Renal disease     Musculoskeletal   Abdominal   Peds  Hematology   Anesthesia Other Findings Sepsis due to methicillin resistant Staphylococcus aureus (MRSA)  Reproductive/Obstetrics                            Anesthesia Physical Anesthesia Plan  ASA: IV  Anesthesia Plan: MAC   Post-op Pain Management:    Induction: Intravenous  PONV Risk Score and Plan:   Airway Management Planned: Simple Face Mask  Additional Equipment:   Intra-op Plan:   Post-operative Plan:   Informed Consent: I have reviewed the patients History and Physical, chart, labs and discussed the procedure including the risks, benefits and alternatives for the proposed anesthesia with the patient or authorized representative who has indicated his/her understanding and acceptance.     Plan Discussed with:   Anesthesia Plan Comments:         Anesthesia Quick Evaluation

## 2016-10-20 NOTE — Procedures (Addendum)
Preliminary TEE report:  1. Normal LV systolic function, LVEF 50-55%. 2. 1.5 x 1.9 cm vegetation seen on posterior mitral leaflet. 3. Trivial mitral regurgitation. 4. Moderate aortic regurgitation.  Full report to follow.

## 2016-10-20 NOTE — Progress Notes (Signed)
SLP   Pt off floor for procedure, unable to see for dysphagia treatment this date.  Thank you,  Havery MorosDabney Porter, CCC-SLP [phone]

## 2016-10-20 NOTE — Anesthesia Postprocedure Evaluation (Signed)
Anesthesia Post Note  Patient: Jon Cross  Procedure(s) Performed: Procedure(s) (LRB): TRANSESOPHAGEAL ECHOCARDIOGRAM (TEE) WITH PROPOFOL (N/A)  Patient location during evaluation: PACU Anesthesia Type: MAC Level of consciousness: awake Pain management: satisfactory to patient Vital Signs Assessment: post-procedure vital signs reviewed and stable Respiratory status: spontaneous breathing and patient connected to nasal cannula oxygen Cardiovascular status: stable Postop Assessment: no signs of nausea or vomiting Anesthetic complications: no     Last Vitals:  Vitals:   10/20/16 1200 10/20/16 1205  BP: 140/76   Pulse: 80 73  Resp: (!) 24 18  Temp:      Last Pain:  Vitals:   10/20/16 1200  TempSrc:   PainSc: 0-No pain                 Renan Danese

## 2016-10-20 NOTE — Progress Notes (Signed)
PROGRESS NOTE  Jon Cross WUJ:[id number] DOB: [birth year] DOA: 10/16/2016 PCP: Joette Catching, MD Brief History:  83 year old male with a history of Alzheimer's dementia, CKD stage III, atrial fibrillation presented with fevers and confusion. Apparently, the patient was working outside in his yard. Later in the day, the patient was found to have altered mental status with generalized weakness. The patient was found to have temperature 100.85F with WBC 20.5 at the time of admission. Blood cultures are growing MRSA. The patient was started on vancomycin. Lumbar culture was performed on 10/18/2016 and showed WBC 14 with 80% neutrophils. The patient was started on empiric acyclovir pending results of his HSV PCR  Assessment/Plan: Sepsis -Secondary to bacteremia -Lactic acid 1.7 -Continue vancomycin -Fluid saline locked secondary to volume overload -leukocytosis improved  MRSA bacteremia -Source unclear -10/17/16 TTE--EF 50-55%, grade 1 DD, mild AI/TR, trivial pericardial effusion -Surveillance blood cultures--neg to date -Appreciate cardiology for TEE, discussed with Dr. Purvis Sheffield -place PICC line when surveillance blood cultures neg x 5 days  Native valve endocarditis -10/20/16 TEE--1.5 cm x 1.9 cm vegetation on posterior mitral leaflet, trivial MR -likely a poor candidate for any surgical intervention--case discussed with ID, Dr. Luciana Axe 7/19 -continue vancomycin  Acute metabolic encephalopathy -Secondary to infectious process -Patient remains confused but slowly improving -10/18/2016 lumbar puncture cultures negative -continue acyclovir pending HSV PCR results  Fluid overload -lasix IV x 1 10/19/16 -personally reviewed 10/17/16 CXR--increase pulm vascular congestion -repeat CXR  Elevated troponin -due to demand ischemia -no chest pain -personally reviewed EKG--sinus with T wave inversion V4-V6 without change  Acute on chronic renal failure--CKD stage  III -Secondary to sepsis and volume depletion -Baseline creatinine 1.2-1.4 -Serum creatinine peaked at 1.78  Dementia without behavioral disturbance -According to the patient's primary care note on 08/17/2016, he was on Namenda and Aricept -Unclear if the patient was taking is properly -Restart Namenda 5 mg daily and Aricept 5 mg daily when more alert  B12 deficiency -Restart supplementation  Thrombocytopenia -due to sepsis -check fibrinogen -stable -am CBC  Hypokalemia -repleted -check mag--1.7  Goals of Care -palliative medicine following -full scope of treatment      Disposition Plan:   SNF in 3-4 days  Family Communication:   Son updated on phone 7/19--Total time spent 35 minutes.  Greater than 50% spent face to face counseling and coordinating care.   Consultants:  cardiology  Code Status:  FULL   DVT Prophylaxis:  [place] Heparin / [place] Lovenox   Procedures: As Listed in Progress Note Above  Antibiotics: Azithromycin 7/16 Ceftriaxone 7/16-7/17 Vancomycin 7/16>>> Acyclovir 7/17>>>    Subjective: The patient is more alert but pleasantly confused at this point. He denies any headache, chest pain, short breath, nausea, vomiting, diarrhea, abdominal pain.  Objective: Vitals:   10/20/16 1155 10/20/16 1200 10/20/16 1205 10/20/16 1229  BP:  140/76  129/62  Pulse: 73 80 73 70  Resp: (!) 30 (!) 24 18 (!) 22  Temp:    98.4 F (36.9 C)  TempSrc:    Oral  SpO2: 95% 93% 94% 95%  Weight:      Height:        Intake/Output Summary (Last 24 hours) at 10/20/16 1545 Last data filed at 10/20/16 1123  Gross per 24 hour  Intake            326.4 ml  Output             3100 ml  Net          -2773.6 ml   Weight change:  Exam:   General:  Pt is alert, follows commands appropriately, not in acute distress  HEENT: No icterus, No thrush, No neck mass, [place]/AT  Cardiovascular: RRR, S1/S2, no rubs, no gallops  Respiratory: Bibasilar crackles. No  wheezing. Good air movement  Abdomen: Soft/+BS, non tender, non distended, no guarding  Extremities: No edema, No lymphangitis, No petechiae, No rashes, no synovitis   Data Reviewed: I have personally reviewed following labs and imaging studies Basic Metabolic Panel:  Recent Labs Lab 10/16/16 2212 10/17/16 0455 10/18/16 0406 10/19/16 0641 10/20/16 0617  NA 138 134* 142 136 138  K 4.4 3.9 4.0 3.4* 3.4*  CL 101 103 107 104 101  CO2 28 24 25 24 26   GLUCOSE 107* 93 75 104* 106*  BUN 16 17 29* 25* 28*  CREATININE 1.54* 1.41* 1.78* 1.28* 1.39*  CALCIUM 8.9 8.1* 8.0* 8.0* 8.2*  MG  --   --   --   --  1.7   Liver Function Tests:  Recent Labs Lab 10/16/16 2212 10/17/16 0455 10/18/16 0406  AST 43* 44* 49*  ALT 34 35 32  ALKPHOS 70 61 47  BILITOT 1.4* 1.3* 0.7  PROT 6.2* 5.6* 4.8*  ALBUMIN 3.4* 3.1* 2.6*   No results for input(s): LIPASE, AMYLASE in the last 168 hours. No results for input(s): AMMONIA in the last 168 hours. Coagulation Profile: No results for input(s): INR, PROTIME in the last 168 hours. CBC:  Recent Labs Lab 10/16/16 2212 10/17/16 0455 10/18/16 0406 10/19/16 0641 10/20/16 0617  WBC 20.5* 12.8* 12.1* 11.5* 9.7  NEUTROABS 18.1* 12.0*  --   --   --   HGB 13.8 13.3 11.9* 12.9* 13.1  HCT 42.2 39.9 35.8* 38.0* 38.4*  MCV 97.0 96.4 97.0 94.3 93.7  PLT 125* 96* 71* 60* 64*   Cardiac Enzymes:  Recent Labs Lab 10/16/16 2212 10/17/16 0049 10/17/16 0455 10/17/16 1004 10/17/16 1606  CKTOTAL 38*  --  64  --   --   TROPONINI 0.06* 0.07* 0.14* 0.21* 0.55*   BNP: Invalid input(s): POCBNP CBG: No results for input(s): GLUCAP in the last 168 hours. HbA1C: No results for input(s): HGBA1C in the last 72 hours. Urine analysis:    Component Value Date/Time   COLORURINE YELLOW 10/16/2016 2313   APPEARANCEUR CLEAR 10/16/2016 2313   LABSPEC 1.018 10/16/2016 2313   PHURINE 7.0 10/16/2016 2313   GLUCOSEU NEGATIVE 10/16/2016 2313   HGBUR NEGATIVE  10/16/2016 2313   BILIRUBINUR NEGATIVE 10/16/2016 2313   KETONESUR NEGATIVE 10/16/2016 2313   PROTEINUR 30 (A) 10/16/2016 2313   NITRITE NEGATIVE 10/16/2016 2313   LEUKOCYTESUR NEGATIVE 10/16/2016 2313   Sepsis Labs: @LABRCNTIP (procalcitonin:4,lacticidven:4) ) Recent Results (from the past 240 hour(s))  Urine culture     Status: None   Collection Time: 10/16/16 11:13 PM  Result Value Ref Range Status   Specimen Description URINE, CLEAN CATCH  Final   Special Requests NONE  Final   Culture   Final    NO GROWTH Performed at [facility], [address], [place], [place] [address]    Report Status 10/18/2016 FINAL  Final  Blood Culture (routine x 2)     Status: Abnormal   Collection Time: 10/17/16 12:37 AM  Result Value Ref Range Status   Specimen Description BLOOD RIGHT ARM  Final   Special Requests   Final    BOTTLES DRAWN AEROBIC AND  ANAEROBIC Blood Culture adequate volume   Culture  Setup Time   Final    GRAM POSITIVE COCCI Gram Stain Report Called to,Read Back By and Verified With: BIVENS,L @ 1243 ON 7.16.18 BY BOWMAN,L IN BOTH AEROBIC AND ANAEROBIC BOTTLES    Culture METHICILLIN RESISTANT STAPHYLOCOCCUS AUREUS (A)  Final   Report Status 10/19/2016 FINAL  Final   Organism ID, Bacteria METHICILLIN RESISTANT STAPHYLOCOCCUS AUREUS  Final      Susceptibility   Methicillin resistant staphylococcus aureus - MIC*    CIPROFLOXACIN <=0.5 SENSITIVE Sensitive     ERYTHROMYCIN >=8 RESISTANT Resistant     GENTAMICIN <=0.5 SENSITIVE Sensitive     OXACILLIN >=4 RESISTANT Resistant     TETRACYCLINE <=1 SENSITIVE Sensitive     VANCOMYCIN 1 SENSITIVE Sensitive     TRIMETH/SULFA <=10 SENSITIVE Sensitive     CLINDAMYCIN <=0.25 SENSITIVE Sensitive     RIFAMPIN <=0.5 SENSITIVE Sensitive     Inducible Clindamycin NEGATIVE Sensitive     * METHICILLIN RESISTANT STAPHYLOCOCCUS AUREUS  Blood Culture ID Panel (Reflexed)     Status: Abnormal   Collection Time: 10/17/16 12:37 AM  Result  Value Ref Range Status   Enterococcus species NOT DETECTED NOT DETECTED Final   Listeria monocytogenes NOT DETECTED NOT DETECTED Final   Staphylococcus species DETECTED (A) NOT DETECTED Final    Comment: CRITICAL RESULT CALLED TO, READ BACK BY AND VERIFIED WITH: Hector Brunswick RN, AT 1844 10/17/16 BY D. VANHOOK    Staphylococcus aureus DETECTED (A) NOT DETECTED Final    Comment: Methicillin (oxacillin)-resistant Staphylococcus aureus (MRSA). MRSA is predictably resistant to beta-lactam antibiotics (except ceftaroline). Preferred therapy is vancomycin unless clinically contraindicated. Patient requires contact precautions if  hospitalized. CRITICAL RESULT CALLED TO, READ BACK BY AND VERIFIED WITH: L. BIVENS RN, AT 1844 10/17/16 BY D. VANHOOK    Methicillin resistance DETECTED (A) NOT DETECTED Final    Comment: CRITICAL RESULT CALLED TO, READ BACK BY AND VERIFIED WITH: L. BIVENS RN, AT 1844 10/17/16 BY D. VANHOOK    Streptococcus species NOT DETECTED NOT DETECTED Final   Streptococcus agalactiae NOT DETECTED NOT DETECTED Final   Streptococcus pneumoniae NOT DETECTED NOT DETECTED Final   Streptococcus pyogenes NOT DETECTED NOT DETECTED Final   Acinetobacter baumannii NOT DETECTED NOT DETECTED Final   Enterobacteriaceae species NOT DETECTED NOT DETECTED Final   Enterobacter cloacae complex NOT DETECTED NOT DETECTED Final   Escherichia coli NOT DETECTED NOT DETECTED Final   Klebsiella oxytoca NOT DETECTED NOT DETECTED Final   Klebsiella pneumoniae NOT DETECTED NOT DETECTED Final   Proteus species NOT DETECTED NOT DETECTED Final   Serratia marcescens NOT DETECTED NOT DETECTED Final   Haemophilus influenzae NOT DETECTED NOT DETECTED Final   Neisseria meningitidis NOT DETECTED NOT DETECTED Final   Pseudomonas aeruginosa NOT DETECTED NOT DETECTED Final   Candida albicans NOT DETECTED NOT DETECTED Final   Candida glabrata NOT DETECTED NOT DETECTED Final   Candida krusei NOT DETECTED NOT DETECTED  Final   Candida parapsilosis NOT DETECTED NOT DETECTED Final   Candida tropicalis NOT DETECTED NOT DETECTED Final    Comment: Performed at [facility], [address] [place], [place] [address]  Blood Culture (routine x 2)     Status: Abnormal   Collection Time: 10/17/16 12:46 AM  Result Value Ref Range Status   Specimen Description BLOOD LEFT ARM  Final   Special Requests   Final    BOTTLES DRAWN AEROBIC ONLY Blood Culture results may not be optimal  due to an inadequate volume of blood received in culture bottles   Culture  Setup Time   Final    GRAM POSITIVE COCCI Gram Stain Report Called to,Read Back By and Verified With: BIVENS,L @ 1243 ON 7.16.18 BY BOWMAN,L AEROBIC BOTTLE ONLY    Culture (A)  Final    STAPHYLOCOCCUS AUREUS SUSCEPTIBILITIES PERFORMED ON PREVIOUS CULTURE WITHIN THE LAST 5 DAYS. Performed at [facility], [address] [place], [place] [address]    Report Status 10/19/2016 FINAL  Final  Gram stain     Status: None   Collection Time: 10/18/16 10:34 AM  Result Value Ref Range Status   Specimen Description CSF  Final   Special Requests NONE  Final   Gram Stain   Final    CYTOSPIN SMEAR WBC SEEN WBC PRESENT,BOTH PMN AND MONONUCLEAR NO ORGANISMS SEEN   Report Status 10/18/2016 FINAL  Final  CSF culture     Status: None (Preliminary result)   Collection Time: 10/18/16 10:34 AM  Result Value Ref Range Status   Specimen Description CSF  Final   Special Requests NONE  Final   Gram Stain   Final    CYTOSPIN SMEAR NO ORGANISMS SEEN WBC SEEN WBC PRESENT,BOTH PMN AND MONONUCLEAR    Culture   Final    NO GROWTH 2 DAYS Performed at [facility], [address] [place], [place] [address]    Report Status PENDING  Incomplete  Culture, blood (Routine X 2) w Reflex to ID Panel     Status: None (Preliminary result)   Collection Time: 10/19/16  6:41 AM  Result Value Ref Range Status   Specimen Description BLOOD RIGHT ARM  Final   Special Requests  Blood Culture adequate volume  Final   Culture NO GROWTH < 24 HOURS  Final   Report Status PENDING  Incomplete  Culture, blood (Routine X 2) w Reflex to ID Panel     Status: None (Preliminary result)   Collection Time: 10/19/16  6:52 AM  Result Value Ref Range Status   Specimen Description BLOOD RIGHT HAND  Final   Special Requests Blood Culture adequate volume  Final   Culture NO GROWTH < 24 HOURS  Final   Report Status PENDING  Incomplete     Scheduled Meds: . aspirin EC  81 mg Oral Daily  . chlorhexidine  15 mL Mouth Rinse BID  . lidocaine      . mouth rinse  15 mL Mouth Rinse q12n4p   Continuous Infusions: . acyclovir Stopped (10/20/16 0817)  . vancomycin Stopped (10/19/16 1227)    Procedures/Studies: Dg Chest 2 View  Result Date: 10/16/2016 CLINICAL DATA:  Altered mental status and weakness after extended time outside. EXAM: CHEST  2 VIEW COMPARISON:  Chest radiograph June 24, 2014 FINDINGS: Cardiac silhouette is moderately enlarged. Calcified aortic knob. Similar mild chronic interstitial changes without pleural effusion or focal consolidation. Increased lung volumes. No pneumothorax. Accentuated kyphosis, chronic approximate T10 compression fracture. Osteopenia. IMPRESSION: Stable cardiomegaly and COPD. Electronically Signed   By: Awilda Metroourtnay  Bloomer M.D.   On: 10/16/2016 23:34   Ct Head Wo Contrast  Result Date: 10/16/2016 CLINICAL DATA:  83 year old male with altered mental status and weakness today. EXAM: CT HEAD WITHOUT CONTRAST TECHNIQUE: Contiguous axial images were obtained from the base of the skull through the vertex without intravenous contrast. COMPARISON:  None. FINDINGS: Brain: Mild cerebral atrophy. Patchy and confluent areas of decreased attenuation are noted throughout the deep and periventricular  white matter of the cerebral hemispheres bilaterally, compatible with chronic microvascular ischemic disease. No evidence of acute infarction, hemorrhage, hydrocephalus,  extra-axial collection or mass lesion/mass effect. Vascular: No hyperdense vessel or unexpected calcification. Skull: Normal. Negative for fracture or focal lesion. Sinuses/Orbits: Mild multifocal mucosal thickening in the ethmoid sinuses. No acute finding. Other: None. IMPRESSION: 1. No acute intracranial abnormalities. 2. Mild cerebral atrophy with mild chronic microvascular ischemic changes in the cerebral white matter. Electronically Signed   By: Trudie Reed M.D.   On: 10/16/2016 23:24   Dg Chest Port 1 View  Result Date: 10/17/2016 CLINICAL DATA:  83 year old male with fever.  Subsequent encounter. EXAM: PORTABLE CHEST 1 VIEW COMPARISON:  10/16/2016. FINDINGS: Cardiomegaly. Pulmonary vascular congestion. No segmental consolidation. Slightly limited evaluation of retrocardiac region. Calcified slightly tortuous aorta. IMPRESSION: Cardiomegaly. Pulmonary vascular congestion. No segmental consolidation. Slightly limited evaluation of retrocardiac region. Electronically Signed   By: Lacy Duverney M.D.   On: 10/17/2016 14:42   Dg Fluoro Guide Lumbar Puncture  Result Date: 10/18/2016 CLINICAL DATA:  Fever EXAM: DIAGNOSTIC LUMBAR PUNCTURE UNDER FLUOROSCOPIC GUIDANCE FLUOROSCOPY TIME:  Fluoroscopy Time:  0 minutes 30 seconds Radiation Exposure Index (if provided by the fluoroscopic device): 4.7 mGy Number of Acquired Spot Images: 2 screen captures during fluoroscopy PROCEDURE: Procedure, benefits, and risks were discussed with the patient, including alternatives. Patient's questions were answered. Written informed consent was obtained by the service from the patient's son. Timeout protocol followed. Patient placed prone. L4-L5 disc space was localized under fluoroscopy. Skin prepped and draped in usual sterile fashion. Skin and soft tissues anesthetized with 2 mL of 1% lidocaine. A 22 gauge needle was advanced into the spinal canal where clear colorless CSF was encountered. 10.5 mL of CSF was obtained in 4  tubes for requested analysis. Procedure tolerated very well by patient without immediate complication. IMPRESSION: Successful fluoroscopic guided lumbar puncture as above. Electronically Signed   By: Ulyses Southward M.D.   On: 10/18/2016 11:42    Yi Falletta, DO  [facility] Pager [phone]  If 7PM-7AM, please contact night-coverage [email or web address] Password [email or web address] 10/20/2016, 3:45 PM   LOS: 3 days

## 2016-10-20 NOTE — Progress Notes (Signed)
Daily Progress Note   Patient Name: Jon Cross       Date: 10/20/2016 DOB: 1933/02/18  Age: 83 y.o. MRN#: [id number] Attending Physician: Catarina Hartshorn, MD Primary Care Physician: Joette Catching, MD Admit Date: 10/16/2016  Reason for Consultation/Follow-up: Establishing goals of care and Psychosocial/spiritual support  Subjective: Jon Cross is resting quietly in bed. There is no family of bedside at this time. Jon Cross will open his eyes briefly when I call his name and touch his shoulder. He denies pain at this time, and returns quickly to sleep. No apparent signs of distress.  Call to son Jeremiyah Cullens. He states that he and his wife are leaving [place] now to drive to [place] to see his daughter. He states that he is unable to come to [place] at this time. We talk about Jon Cross test results, positive vegetation on TEE. I share that we are continuing with full scope of treatment at this time, but [name] may need additional weeks of IV antibiotics. We talk about Mr. Jon Cross health history including dementia. Son states that he was not aware that his father had dementia. Jon Cross states that he was with his father for about 5 days this June and he did not see signs. We talk about people hiding forgetfulness, redirecting forgetfulness, and the shame that is involved. Jon Cross is questioning who has diagnosed his father with dementia. I review PCP Nylands notes from February and May of this year. I share with Jon Cross my concern that when people have dementias, they can sometimes take longer to recover their mental status. We talk about evaluation by the mini mental status exam.  We talk about Jon Cross current living situation. Jon Cross states that his mother died when he was 69 months old, his  adopted mother passed in 55.  Jon Cross states that Jon Cross brother asked Jon Cross to care for his wife when he died, and Jon Cross has been living with his sister-in-law since that time.  We talk about what's next, and I bring up SNF for rehab as an extra layer of support prior to transition to home. Jon Cross is agreeable to rehab SNF when able. He asks for recommendation of facilities.  Jon Cross states he may not be able to talk with the medical team tomorrow due to traveling. I  encouraged him to call Jon Cross as needed.  Length of Stay: 3  Current Medications: Scheduled Meds:  . aspirin EC  81 mg Oral Daily  . chlorhexidine  15 mL Mouth Rinse BID  . lidocaine      . mouth rinse  15 mL Mouth Rinse q12n4p  . potassium chloride  20 mEq Oral Once    Continuous Infusions: . acyclovir Stopped (10/20/16 0817)  . magnesium sulfate 1 - 4 g bolus IVPB    . vancomycin Stopped (10/19/16 1227)    PRN Meds: acetaminophen **OR** acetaminophen  Physical Exam  Constitutional: No distress.  Briefly opens eyes when asked, calm, sleepy  HENT:  Head: Atraumatic.  Cardiovascular: Normal rate and regular rhythm.   Pulmonary/Chest: Effort normal. No respiratory distress.  Abdominal: Soft. He exhibits no distension.  Musculoskeletal: He exhibits no edema.  Neurological:  Sleepy status post TEE  Skin: Skin is warm and dry.  Nursing note and vitals reviewed.           Vital Signs: BP 130/80 (BP Location: Left Arm)   Pulse 70   Temp (!) 97.4 F (36.3 C) (Axillary)   Resp 18   Ht 5\' 6"  (1.676 m)   Wt 61.9 kg (136 lb 6.4 oz)   SpO2 97%   BMI 22.02 kg/m  SpO2: SpO2: 97 % O2 Device: O2 Device: Nasal Cannula O2 Flow Rate: O2 Flow Rate (L/min): 2 L/min  Intake/output summary:  Intake/Output Summary (Last 24 hours) at 10/20/16 1624 Last data filed at 10/20/16 1123  Gross per 24 hour  Intake            326.4 ml  Output             2600 ml  Net          -2273.6 ml   LBM: Last BM Date:  10/19/16 Baseline Weight: Weight: 65.8 kg (145 lb) Most recent weight: Weight: 61.9 kg (136 lb 6.4 oz)       Palliative Assessment/Data:    Flowsheet Rows     Most Recent Value  Intake Tab  Referral Department  Hospitalist  Unit at Time of Referral  Med/Surg Unit  Palliative Care Primary Diagnosis  Sepsis/Infectious Disease  Date Notified  10/17/16  Palliative Care Type  New Palliative care  Reason for referral  Clarify Goals of Care  Date of Admission  10/16/16  Date first seen by Palliative Care  10/18/16  # of days Palliative referral response time  1 Day(s)  # of days IP prior to Palliative referral  1  Clinical Assessment  Palliative Performance Scale Score  20%  Pain Max last 24 hours  Not able to report  Pain Min Last 24 hours  Not able to report  Dyspnea Max Last 24 Hours  Not able to report  Dyspnea Min Last 24 hours  Not able to report  Psychosocial & Spiritual Assessment  Palliative Care Outcomes  Patient/Family meeting held?  No [Patient is confused, no family at bedside]  Palliative Care Outcomes  Provided psychosocial or spiritual support  Palliative Care follow-up planned  Yes, Facility      Patient Active Problem List   Diagnosis Date Noted  . MRSA bacteremia 10/19/2016  . Acute renal failure superimposed on stage 3 chronic kidney disease (HCC) 10/19/2016  . Dementia without behavioral disturbance 10/19/2016  . Acute metabolic encephalopathy 10/18/2016  . Palliative care encounter   . Goals of care, counseling/discussion   . DNR (do not resuscitate)  discussion   . Sepsis due to methicillin resistant Staphylococcus aureus (MRSA) (HCC) 10/17/2016  . Abnormal ECG 08/23/2012  . Essential hypertension, benign 12/11/2008  . PREMATURE ATRIAL CONTRACTIONS 12/11/2008  . CARDIAC MURMUR 12/11/2008    Palliative Care Assessment & Plan   Patient Profile: 83 y.o. male  with past medical history of Alzheimer's disease, atrial fibrillation, hypertension,  staphylococcal skin infection history, herpes zoster infection history admitted on 10/16/2016 with SI RSS, found to have bacteremia.   Assessment: bacteremia; [name] is being treated with IV antibiotics. His also having IV acyclovir for empiric treatment status post spinal tap with CSF cultures pending. He had a TEE today which showed some vegetation.  Recommendations/Plan:  son, [name] is agreeable to rehab SNF when able. He asks for recommendation of facilities. Jon Cross is agreeable to continue with weeks of antibiotics if necessary.  Goals of Care and Additional Recommendations:  Limitations on Scope of Treatment: Full Scope Treatment  Code Status:    Code Status Orders        Start     Ordered   10/17/16 0410  Full code  Continuous     10/17/16 0410    Code Status History    Date Active Date Inactive Code Status Order ID Comments User Context   This patient has a current code status but no historical code status.       Prognosis:   Unable to determine based on outcomes. 3 months or less would not be surprising if Mr. Dishman does not have resolution of his encephalopathy. This is based on bacteremia and cerebrospinal fluid with increased white blood cells.  Discharge Planning:  [name] is agreeable to SNF for rehab.  Care plan was discussed with nursing staff, case manager, social worker, speech therapists, and Dr. Arbutus Leas.   Thank you for allowing the Palliative Medicine Team to assist in the care of this patient.   Time In: 1320 Time Out: 1355 Total Time 35 minutes Prolonged Time Billed  no       Greater than 50%  of this time was spent counseling and coordinating care related to the above assessment and plan.  Katheran Awe, NP  Please contact Palliative Medicine Team phone at [phone] for questions and concerns.

## 2016-10-20 NOTE — Progress Notes (Signed)
*  PRELIMINARY RESULTS* Echocardiogram Echocardiogram Transesophageal has been performed.  Stacey DrainWhite, Timoteo Carreiro J 10/20/2016, 12:05 PM

## 2016-10-20 NOTE — Transfer of Care (Signed)
Immediate Anesthesia Transfer of Care Note  Patient: Jon Cross  Procedure(s) Performed: Procedure(s): TRANSESOPHAGEAL ECHOCARDIOGRAM (TEE) WITH PROPOFOL (N/A)  Patient Location: PACU  Anesthesia Type:MAC  Level of Consciousness: sedated and patient cooperative  Airway & Oxygen Therapy: Patient Spontanous Breathing and non-rebreather face mask  Post-op Assessment: Report given to RN and Post -op Vital signs reviewed and stable  Post vital signs: Reviewed and stable  Last Vitals:  Vitals:   10/20/16 1045 10/20/16 1050  BP: 120/60 129/68  Pulse:    Resp: (!) 44 (!) 35  Temp:      Last Pain:  Vitals:   10/20/16 1010  TempSrc: Oral  PainSc:          Complications: No apparent anesthesia complications

## 2016-10-20 NOTE — Progress Notes (Signed)
Right eye matted shut. Warm wet washcloth applied. Eyes and face wiped clean. Mouth care done. Suctioned x1 via oral cavity for small amt thick yellow mucus. Tolerated well. Resp adequate/nonlabored. O2 continued via nasal cannula at 2 l/m. Foley secured to left thigh.

## 2016-10-21 ENCOUNTER — Inpatient Hospital Stay (HOSPITAL_COMMUNITY): Payer: Medicare HMO

## 2016-10-21 ENCOUNTER — Encounter (HOSPITAL_COMMUNITY): Payer: Self-pay | Admitting: Cardiovascular Disease

## 2016-10-21 DIAGNOSIS — J690 Pneumonitis due to inhalation of food and vomit: Secondary | ICD-10-CM

## 2016-10-21 DIAGNOSIS — A419 Sepsis, unspecified organism: Secondary | ICD-10-CM

## 2016-10-21 DIAGNOSIS — J9601 Acute respiratory failure with hypoxia: Secondary | ICD-10-CM

## 2016-10-21 DIAGNOSIS — A4902 Methicillin resistant Staphylococcus aureus infection, unspecified site: Secondary | ICD-10-CM

## 2016-10-21 LAB — BASIC METABOLIC PANEL
Anion gap: 9 (ref 5–15)
BUN: 29 mg/dL — AB (ref 6–20)
CO2: 28 mmol/L (ref 22–32)
CREATININE: 1.23 mg/dL (ref 0.61–1.24)
Calcium: 8.2 mg/dL — ABNORMAL LOW (ref 8.9–10.3)
Chloride: 101 mmol/L (ref 101–111)
GFR calc Af Amer: >60 mL/min (ref >60)
GFR, EST NON AFRICAN AMERICAN: 53 mL/min — AB (ref >60)
Glucose, Bld: 113 mg/dL — ABNORMAL HIGH (ref 65–99)
POTASSIUM: 3.6 mmol/L (ref 3.5–5.1)
SODIUM: 138 mmol/L (ref 135–145)

## 2016-10-21 LAB — VITAMIN B12: VITAMIN B 12: 1574 pg/mL — AB (ref 180–914)

## 2016-10-21 LAB — LEGIONELLA PNEUMOPHILA SEROGP 1 UR AG: L. PNEUMOPHILA SEROGP 1 UR AG: NEGATIVE

## 2016-10-21 LAB — CBC
HCT: 39.3 % (ref 39.0–52.0)
HEMOGLOBIN: 13.2 g/dL (ref 13.0–17.0)
MCH: 31.8 pg (ref 26.0–34.0)
MCHC: 33.6 g/dL (ref 30.0–36.0)
MCV: 94.7 fL (ref 78.0–100.0)
Platelets: 78 10*3/uL — ABNORMAL LOW (ref 150–400)
RBC: 4.15 MIL/uL — ABNORMAL LOW (ref 4.22–5.81)
RDW: 13.2 % (ref 11.5–15.5)
WBC: 10.2 10*3/uL (ref 4.0–10.5)

## 2016-10-21 LAB — FIBRINOGEN: FIBRINOGEN: 569 mg/dL — AB (ref 210–475)

## 2016-10-21 LAB — MAGNESIUM: MAGNESIUM: 2 mg/dL (ref 1.7–2.4)

## 2016-10-21 MED ORDER — DEXTROSE 5 % IV SOLN
660.0000 mg | Freq: Three times a day (TID) | INTRAVENOUS | Status: DC
  Filled 2016-10-21 (×10): qty 13.2

## 2016-10-21 MED ORDER — DEXTROSE 5 % IV SOLN
650.0000 mg | Freq: Three times a day (TID) | INTRAVENOUS | Status: DC
  Filled 2016-10-21 (×10): qty 13

## 2016-10-21 MED ORDER — DEXTROSE 5 % IV SOLN
650.0000 mg | Freq: Two times a day (BID) | INTRAVENOUS | Status: DC
  Filled 2016-10-21 (×6): qty 13

## 2016-10-21 MED ORDER — FUROSEMIDE 10 MG/ML IJ SOLN
20.0000 mg | Freq: Once | INTRAMUSCULAR | Status: AC
  Administered 2016-10-21: 20 mg via INTRAVENOUS
  Filled 2016-10-21: qty 2

## 2016-10-21 MED ORDER — ACETYLCYSTEINE 20 % IN SOLN
4.0000 mL | Freq: Three times a day (TID) | RESPIRATORY_TRACT | Status: DC
  Administered 2016-10-22 – 2016-10-26 (×12): 4 mL via RESPIRATORY_TRACT
  Filled 2016-10-21 (×14): qty 4

## 2016-10-21 MED ORDER — SODIUM CHLORIDE 0.9 % IV SOLN
3.0000 g | Freq: Three times a day (TID) | INTRAVENOUS | Status: DC
  Administered 2016-10-21 – 2016-10-25 (×12): 3 g via INTRAVENOUS
  Filled 2016-10-21 (×15): qty 3

## 2016-10-21 NOTE — Progress Notes (Addendum)
Pharmacy Antibiotic Note  Jon Cross is a 83 y.o. male admitted on 10/16/2016 with bacteremia, endocarditis and possible meningitis.  Pharmacy has been consulted for Acyclovir and vancomycin dosing.  Plan: Cont Acyclovir 10mg /kg 650mg  IV q12h Cont Vancomycin 1000mg  IV q24hrs F/U cxs and clinical progress Monitor V/S, labs, and levels as indicated  Height: 5\' 6"  (167.6 cm) Weight: 142 lb 10.2 oz (64.7 kg) IBW/kg (Calculated) : 63.8  Temp (24hrs), Avg:97.9 F (36.6 C), Min:97.4 F (36.3 C), Max:98.4 F (36.9 C)   Recent Labs Lab 10/17/16 0049 10/17/16 0455 10/18/16 0406 10/19/16 0641 10/20/16 0617 10/21/16 0723  WBC  --  12.8* 12.1* 11.5* 9.7 10.2  CREATININE  --  1.41* 1.78* 1.28* 1.39* 1.23  LATICACIDVEN 1.4 1.7  --   --   --   --     Estimated Creatinine Clearance: 42.5 mL/min (by C-G formula based on SCr of 1.23 mg/dL).    Allergies  Allergen Reactions  . Dristan Cold [Chlorphen-Pe-Acetaminophen] Nausea Only  . Sulfonamide Derivatives Nausea Only    Antimicrobials this admission: Acyclovir 7/17 >>  Azithromycin 7/16 >> 7/17 Rocephin 7/16 >> 7/17 Vancomycin 7/16 >> Unasyn  7/20>>  Dose adjustments this admission: N/A  Microbiology results: 7/16 BCx:  BCID +MRSA x 2 bottles 7/15 UCx: no growth 7/17 CSF:  pending  Thank you for allowing pharmacy to be a part of this patient's care. Talbert CageLora Yareliz Thorstenson, PhrmD  10/21/2016 10:52 AM  Addum:  Add Unasyn 3gm IV q8 hours for aspiration PNA.

## 2016-10-21 NOTE — Progress Notes (Signed)
PROGRESS NOTE  Jon Cross JXB:[id number] DOB: 1932-10-07 DOA: 10/16/2016 PCP: Joette Catching, MD  Brief History: 83 year old male with a history of Alzheimer's dementia, CKD stage III, atrial fibrillation presented with fevers and confusion. Apparently, the patient was working outside in his yard. Later in the day, the patient was found to have altered mental status with generalized weakness. The patient was found to have temperature 100.66F with WBC 20.5 at the time of admission. Blood cultures are growing MRSA. The patient was started on vancomycin. Lumbar culture was performed on 10/18/2016 and showed WBC14 with 80% neutrophils. The patient was started on empiric acyclovir pending results of his HSV PCR.  Acyclovir was discontinued a 1 stage is any PCR was negative. TEE revealed a mitral valve vegetation. Repeat blood cultures were obtained and remained negative. Unfortunately, the patient developed respiratory failure secondary to aspiration pneumonia. He was started on Unasyn. Repeat chest x-ray on 10/21/2016 showed atelectasis involving almost the entire left lung with diffuse infiltrates of the right lung. Pulmonary was consulted to assist  Assessment/Plan: Sepsis -Secondary to bacteremia -Lactic acid 1.7 -Continue vancomycin -Fluid saline locked secondary to volume overload -leukocytosis improved -remains hemodynamically stable  MRSA bacteremia -Source unclear -10/17/16 TTE--EF 50-55%, grade 1 DD, mild AI/TR, trivial pericardial effusion -Surveillance blood cultures--neg to date -Appreciate cardiology for TEE, discussed with Dr. Purvis Sheffield -place PICC line when surveillance blood cultures neg x 5 days  Native valve endocarditis -10/20/16 TEE--1.5 cm x 1.9 cm vegetation on posterior mitral leaflet, trivial MR -likely a poor candidate for any surgical intervention--case discussed with ID, Dr. Luciana Axe 7/19 -continue vancomycin  Acute respiratory failure with  hypoxia -Secondary to aspiration pneumonia and atelectasis of left lung -Start Unasyn -Stable on 2 L nasal cannula  Aspiration pneumonia -start unasyn  Atelectasis of left lung -consult pulmonary--?need for bronchoscopy -start mucomyst  Acute metabolic encephalopathy -Secondary to infectious process -Patient remains confused but  improving -10/18/2016 lumbar puncture cultures negative -HSV PCR negative--discontinue acyclovir  Elevated troponin -due to demand ischemia -no chest pain -personally reviewed EKG--sinus with T wave inversion V4-V6 without change  Acute on chronic renal failure--CKD stage III -Secondary to sepsis and volume depletion -Baseline creatinine 1.2-1.4 -Serum creatinine peaked at 1.78  Dementia without behavioral disturbance -According to the patient's primary care note on 08/17/2016, he wasonNamenda and Aricept -Unclear if the patient was taking is properly -Restart Namenda 5 mg daily and Aricept 5 mg daily when more alert  B12 deficiency -Restart supplementation  Thrombocytopenia -due to sepsis -check fibrinogen--569 -stable-->trending back up -am CBC  Hypokalemia -repleted -check mag--1.7  Goals of Care -palliative medicine following -full scope of treatment   Disposition Plan: SNF in 3-4 days  Family Communication: Son updated on phone 7/19   Consultants: cardiology  Code Status: FULL   DVT Prophylaxis: [place] Heparin / [place] Lovenox   Procedures: As Listed in Progress Note Above  Antibiotics: Azithromycin 7/16 Ceftriaxone 7/16-7/17 Acyclovir 7/17>>>10/21/16 Vancomycin 7/16>>> Unasyn 7/20>>>   Subjective: Patient is pleasant but confused but is able to answer questions appropriately. Denies any fevers, chills, headache, chest pain, shortness breath, abdominal pain, vomiting, diarrhea.  Objective: Vitals:   10/20/16 2100 10/21/16 0433 10/21/16 0500 10/21/16 1423  BP: (!) 141/79 (!) 141/62  140/61   Pulse: (!) 51 88  70  Resp: 18 20  20   Temp:  97.6 F (36.4 C)  98.7 F (37.1 C)  TempSrc:  Axillary  Oral  SpO2: 95% 93%  Weight:   64.7 kg (142 lb 10.2 oz)   Height:        Intake/Output Summary (Last 24 hours) at 10/21/16 1558 Last data filed at 10/21/16 1300  Gross per 24 hour  Intake            930.4 ml  Output             1000 ml  Net            -69.6 ml   Weight change: 2.829 kg (6 lb 3.8 oz) Exam:   General:  Pt is alert, follows commands appropriately, not in acute distress  HEENT: No icterus, No thrush, No neck mass, /AT  Cardiovascular: RRR, S1/S2, no rubs, no gallops  Respiratory: Bilateral crackles. Diminished breath sounds left greater than right. No wheezing.  Abdomen: Soft/+BS, non tender, non distended, no guarding  Extremities: No edema, No lymphangitis, No petechiae, No rashes, no synovitis   Data Reviewed: I have personally reviewed following labs and imaging studies Basic Metabolic Panel:  Recent Labs Lab 10/17/16 0455 10/18/16 0406 10/19/16 0641 10/20/16 0617 10/21/16 0723  NA 134* 142 136 138 138  K 3.9 4.0 3.4* 3.4* 3.6  CL 103 107 104 101 101  CO2 24 25 24 26 28   GLUCOSE 93 75 104* 106* 113*  BUN 17 29* 25* 28* 29*  CREATININE 1.41* 1.78* 1.28* 1.39* 1.23  CALCIUM 8.1* 8.0* 8.0* 8.2* 8.2*  MG  --   --   --  1.7 2.0   Liver Function Tests:  Recent Labs Lab 10/16/16 2212 10/17/16 0455 10/18/16 0406  AST 43* 44* 49*  ALT 34 35 32  ALKPHOS 70 61 47  BILITOT 1.4* 1.3* 0.7  PROT 6.2* 5.6* 4.8*  ALBUMIN 3.4* 3.1* 2.6*   No results for input(s): LIPASE, AMYLASE in the last 168 hours. No results for input(s): AMMONIA in the last 168 hours. Coagulation Profile: No results for input(s): INR, PROTIME in the last 168 hours. CBC:  Recent Labs Lab 10/16/16 2212 10/17/16 0455 10/18/16 0406 10/19/16 0641 10/20/16 0617 10/21/16 0723  WBC 20.5* 12.8* 12.1* 11.5* 9.7 10.2  NEUTROABS 18.1* 12.0*  --   --   --   --   HGB  13.8 13.3 11.9* 12.9* 13.1 13.2  HCT 42.2 39.9 35.8* 38.0* 38.4* 39.3  MCV 97.0 96.4 97.0 94.3 93.7 94.7  PLT 125* 96* 71* 60* 64* 78*   Cardiac Enzymes:  Recent Labs Lab 10/16/16 2212 10/17/16 0049 10/17/16 0455 10/17/16 1004 10/17/16 1606  CKTOTAL 38*  --  64  --   --   TROPONINI 0.06* 0.07* 0.14* 0.21* 0.55*   BNP: Invalid input(s): POCBNP CBG: No results for input(s): GLUCAP in the last 168 hours. HbA1C: No results for input(s): HGBA1C in the last 72 hours. Urine analysis:    Component Value Date/Time   COLORURINE YELLOW 10/16/2016 2313   APPEARANCEUR CLEAR 10/16/2016 2313   LABSPEC 1.018 10/16/2016 2313   PHURINE 7.0 10/16/2016 2313   GLUCOSEU NEGATIVE 10/16/2016 2313   HGBUR NEGATIVE 10/16/2016 2313   BILIRUBINUR NEGATIVE 10/16/2016 2313   KETONESUR NEGATIVE 10/16/2016 2313   PROTEINUR 30 (A) 10/16/2016 2313   NITRITE NEGATIVE 10/16/2016 2313   LEUKOCYTESUR NEGATIVE 10/16/2016 2313   Sepsis Labs: @LABRCNTIP (procalcitonin:4,lacticidven:4) ) Recent Results (from the past 240 hour(s))  Urine culture     Status: None   Collection Time: 10/16/16 11:13 PM  Result Value Ref Range Status   Specimen Description URINE, CLEAN CATCH  Final  Special Requests NONE  Final   Culture   Final    NO GROWTH Performed at [facility], [address], [place], [place] [address]    Report Status 10/18/2016 FINAL  Final  Blood Culture (routine x 2)     Status: Abnormal   Collection Time: 10/17/16 12:37 AM  Result Value Ref Range Status   Specimen Description BLOOD RIGHT ARM  Final   Special Requests   Final    BOTTLES DRAWN AEROBIC AND ANAEROBIC Blood Culture adequate volume   Culture  Setup Time   Final    GRAM POSITIVE COCCI Gram Stain Report Called to,Read Back By and Verified With: BIVENS,L @ 1243 ON 7.16.18 BY BOWMAN,L IN BOTH AEROBIC AND ANAEROBIC BOTTLES    Culture METHICILLIN RESISTANT STAPHYLOCOCCUS AUREUS (A)  Final   Report Status 10/19/2016 FINAL   Final   Organism ID, Bacteria METHICILLIN RESISTANT STAPHYLOCOCCUS AUREUS  Final      Susceptibility   Methicillin resistant staphylococcus aureus - MIC*    CIPROFLOXACIN <=0.5 SENSITIVE Sensitive     ERYTHROMYCIN >=8 RESISTANT Resistant     GENTAMICIN <=0.5 SENSITIVE Sensitive     OXACILLIN >=4 RESISTANT Resistant     TETRACYCLINE <=1 SENSITIVE Sensitive     VANCOMYCIN 1 SENSITIVE Sensitive     TRIMETH/SULFA <=10 SENSITIVE Sensitive     CLINDAMYCIN <=0.25 SENSITIVE Sensitive     RIFAMPIN <=0.5 SENSITIVE Sensitive     Inducible Clindamycin NEGATIVE Sensitive     * METHICILLIN RESISTANT STAPHYLOCOCCUS AUREUS  Blood Culture ID Panel (Reflexed)     Status: Abnormal   Collection Time: 10/17/16 12:37 AM  Result Value Ref Range Status   Enterococcus species NOT DETECTED NOT DETECTED Final   Listeria monocytogenes NOT DETECTED NOT DETECTED Final   Staphylococcus species DETECTED (A) NOT DETECTED Final    Comment: CRITICAL RESULT CALLED TO, READ BACK BY AND VERIFIED WITH: Hector Brunswick RN, AT 1844 10/17/16 BY D. VANHOOK    Staphylococcus aureus DETECTED (A) NOT DETECTED Final    Comment: Methicillin (oxacillin)-resistant Staphylococcus aureus (MRSA). MRSA is predictably resistant to beta-lactam antibiotics (except ceftaroline). Preferred therapy is vancomycin unless clinically contraindicated. Patient requires contact precautions if  hospitalized. CRITICAL RESULT CALLED TO, READ BACK BY AND VERIFIED WITH: L. BIVENS RN, AT 1844 10/17/16 BY D. VANHOOK    Methicillin resistance DETECTED (A) NOT DETECTED Final    Comment: CRITICAL RESULT CALLED TO, READ BACK BY AND VERIFIED WITH: L. BIVENS RN, AT 1844 10/17/16 BY D. VANHOOK    Streptococcus species NOT DETECTED NOT DETECTED Final   Streptococcus agalactiae NOT DETECTED NOT DETECTED Final   Streptococcus pneumoniae NOT DETECTED NOT DETECTED Final   Streptococcus pyogenes NOT DETECTED NOT DETECTED Final   Acinetobacter baumannii NOT DETECTED NOT  DETECTED Final   Enterobacteriaceae species NOT DETECTED NOT DETECTED Final   Enterobacter cloacae complex NOT DETECTED NOT DETECTED Final   Escherichia coli NOT DETECTED NOT DETECTED Final   Klebsiella oxytoca NOT DETECTED NOT DETECTED Final   Klebsiella pneumoniae NOT DETECTED NOT DETECTED Final   Proteus species NOT DETECTED NOT DETECTED Final   Serratia marcescens NOT DETECTED NOT DETECTED Final   Haemophilus influenzae NOT DETECTED NOT DETECTED Final   Neisseria meningitidis NOT DETECTED NOT DETECTED Final   Pseudomonas aeruginosa NOT DETECTED NOT DETECTED Final   Candida albicans NOT DETECTED NOT DETECTED Final   Candida glabrata NOT DETECTED NOT DETECTED Final   Candida krusei NOT DETECTED NOT DETECTED Final   Candida parapsilosis  NOT DETECTED NOT DETECTED Final   Candida tropicalis NOT DETECTED NOT DETECTED Final    Comment: Performed at [facility], [address] [place], [place] [address]  Blood Culture (routine x 2)     Status: Abnormal   Collection Time: 10/17/16 12:46 AM  Result Value Ref Range Status   Specimen Description BLOOD LEFT ARM  Final   Special Requests   Final    BOTTLES DRAWN AEROBIC ONLY Blood Culture results may not be optimal due to an inadequate volume of blood received in culture bottles   Culture  Setup Time   Final    GRAM POSITIVE COCCI Gram Stain Report Called to,Read Back By and Verified With: BIVENS,L @ 1243 ON 7.16.18 BY BOWMAN,L AEROBIC BOTTLE ONLY    Culture (A)  Final    STAPHYLOCOCCUS AUREUS SUSCEPTIBILITIES PERFORMED ON PREVIOUS CULTURE WITHIN THE LAST 5 DAYS. Performed at [facility], [address], [place], [place] [address]    Report Status 10/19/2016 FINAL  Final  Gram stain     Status: None   Collection Time: 10/18/16 10:34 AM  Result Value Ref Range Status   Specimen Description CSF  Final   Special Requests NONE  Final   Gram Stain   Final    CYTOSPIN SMEAR WBC SEEN WBC PRESENT,BOTH PMN AND MONONUCLEAR NO  ORGANISMS SEEN   Report Status 10/18/2016 FINAL  Final  CSF culture     Status: None (Preliminary result)   Collection Time: 10/18/16 10:34 AM  Result Value Ref Range Status   Specimen Description CSF  Final   Special Requests NONE  Final   Gram Stain   Final    CYTOSPIN SMEAR NO ORGANISMS SEEN WBC SEEN WBC PRESENT,BOTH PMN AND MONONUCLEAR    Culture   Final    NO GROWTH 3 DAYS Performed at [facility], [address], [place], [place] [address]    Report Status PENDING  Incomplete  Culture, blood (Routine X 2) w Reflex to ID Panel     Status: None (Preliminary result)   Collection Time: 10/19/16  6:41 AM  Result Value Ref Range Status   Specimen Description BLOOD RIGHT ARM  Final   Special Requests Blood Culture adequate volume  Final   Culture NO GROWTH 2 DAYS  Final   Report Status PENDING  Incomplete  Culture, blood (Routine X 2) w Reflex to ID Panel     Status: None (Preliminary result)   Collection Time: 10/19/16  6:52 AM  Result Value Ref Range Status   Specimen Description BLOOD RIGHT HAND  Final   Special Requests Blood Culture adequate volume  Final   Culture NO GROWTH 2 DAYS  Final   Report Status PENDING  Incomplete     Scheduled Meds: . acetylcysteine  4 mL Nebulization TID  . aspirin EC  81 mg Oral Daily  . chlorhexidine  15 mL Mouth Rinse BID  . furosemide  20 mg Intravenous Once  . mouth rinse  15 mL Mouth Rinse q12n4p  . vitamin B-12  500 mcg Oral Daily   Continuous Infusions: . acyclovir    . ampicillin-sulbactam (UNASYN) IV    . vancomycin 1,000 mg (10/21/16 1122)    Procedures/Studies: Dg Chest 2 View  Result Date: 10/16/2016 CLINICAL DATA:  Altered mental status and weakness after extended time outside. EXAM: CHEST  2 VIEW COMPARISON:  Chest radiograph June 24, 2014 FINDINGS: Cardiac silhouette is moderately enlarged. Calcified aortic knob. Similar  mild chronic interstitial changes without pleural effusion or focal consolidation.  Increased lung volumes. No pneumothorax. Accentuated kyphosis, chronic approximate T10 compression fracture. Osteopenia. IMPRESSION: Stable cardiomegaly and COPD. Electronically Signed   By: Awilda Metroourtnay  Bloomer M.D.   On: 10/16/2016 23:34   Ct Head Wo Contrast  Result Date: 10/16/2016 CLINICAL DATA:  83 year old male with altered mental status and weakness today. EXAM: CT HEAD WITHOUT CONTRAST TECHNIQUE: Contiguous axial images were obtained from the base of the skull through the vertex without intravenous contrast. COMPARISON:  None. FINDINGS: Brain: Mild cerebral atrophy. Patchy and confluent areas of decreased attenuation are noted throughout the deep and periventricular white matter of the cerebral hemispheres bilaterally, compatible with chronic microvascular ischemic disease. No evidence of acute infarction, hemorrhage, hydrocephalus, extra-axial collection or mass lesion/mass effect. Vascular: No hyperdense vessel or unexpected calcification. Skull: Normal. Negative for fracture or focal lesion. Sinuses/Orbits: Mild multifocal mucosal thickening in the ethmoid sinuses. No acute finding. Other: None. IMPRESSION: 1. No acute intracranial abnormalities. 2. Mild cerebral atrophy with mild chronic microvascular ischemic changes in the cerebral white matter. Electronically Signed   By: Trudie Reedaniel  Entrikin M.D.   On: 10/16/2016 23:24   Dg Chest Port 1 View  Result Date: 10/21/2016 CLINICAL DATA:  Hypoxia. EXAM: PORTABLE CHEST 1 VIEW COMPARISON:  10/17/2016 . FINDINGS: Cardiomegaly is again noted. Near complete opacification of the left lung with volume loss noted. This is consistent with atelectasis. Diffuse right lung infiltrate and/or edema. Left-sided pleural effusion cannot be excluded. No pneumothorax . IMPRESSION: 1. Near complete opacification left lung with volume loss. This consistent with left lung atelectasis. Underlying infiltrate cannot be excluded. Left-sided pleural effusion cannot be excluded. 2.   Diffuse infiltrate/edema right lung. 3. Persistent cardiomegaly. Electronically Signed   By: Maisie Fushomas  Register   On: 10/21/2016 07:44   Dg Chest Port 1 View  Result Date: 10/17/2016 CLINICAL DATA:  83 year old male with fever.  Subsequent encounter. EXAM: PORTABLE CHEST 1 VIEW COMPARISON:  10/16/2016. FINDINGS: Cardiomegaly. Pulmonary vascular congestion. No segmental consolidation. Slightly limited evaluation of retrocardiac region. Calcified slightly tortuous aorta. IMPRESSION: Cardiomegaly. Pulmonary vascular congestion. No segmental consolidation. Slightly limited evaluation of retrocardiac region. Electronically Signed   By: Lacy DuverneySteven  Olson M.D.   On: 10/17/2016 14:42   Dg Fluoro Guide Lumbar Puncture  Result Date: 10/18/2016 CLINICAL DATA:  Fever EXAM: DIAGNOSTIC LUMBAR PUNCTURE UNDER FLUOROSCOPIC GUIDANCE FLUOROSCOPY TIME:  Fluoroscopy Time:  0 minutes 30 seconds Radiation Exposure Index (if provided by the fluoroscopic device): 4.7 mGy Number of Acquired Spot Images: 2 screen captures during fluoroscopy PROCEDURE: Procedure, benefits, and risks were discussed with the patient, including alternatives. Patient's questions were answered. Written informed consent was obtained by the service from the patient's son. Timeout protocol followed. Patient placed prone. L4-L5 disc space was localized under fluoroscopy. Skin prepped and draped in usual sterile fashion. Skin and soft tissues anesthetized with 2 mL of 1% lidocaine. A 22 gauge needle was advanced into the spinal canal where clear colorless CSF was encountered. 10.5 mL of CSF was obtained in 4 tubes for requested analysis. Procedure tolerated very well by patient without immediate complication. IMPRESSION: Successful fluoroscopic guided lumbar puncture as above. Electronically Signed   By: Ulyses SouthwardMark  Boles M.D.   On: 10/18/2016 11:42    Casmere Hollenbeck, DO  [facility] Pager [phone]  If 7PM-7AM, please contact  night-coverage [email or web address] Password [email or web address] 10/21/2016, 3:58 PM   LOS: 4 days

## 2016-10-22 ENCOUNTER — Inpatient Hospital Stay (HOSPITAL_COMMUNITY): Payer: Medicare HMO

## 2016-10-22 LAB — CBC
HCT: 38.5 % — ABNORMAL LOW (ref 39.0–52.0)
HEMOGLOBIN: 13.1 g/dL (ref 13.0–17.0)
MCH: 32 pg (ref 26.0–34.0)
MCHC: 34 g/dL (ref 30.0–36.0)
MCV: 94.1 fL (ref 78.0–100.0)
Platelets: 121 10*3/uL — ABNORMAL LOW (ref 150–400)
RBC: 4.09 MIL/uL — AB (ref 4.22–5.81)
RDW: 13.2 % (ref 11.5–15.5)
WBC: 10.4 10*3/uL (ref 4.0–10.5)

## 2016-10-22 LAB — EXPECTORATED SPUTUM ASSESSMENT W GRAM STAIN, RFLX TO RESP C

## 2016-10-22 LAB — MAGNESIUM: MAGNESIUM: 1.9 mg/dL (ref 1.7–2.4)

## 2016-10-22 LAB — CSF CULTURE: CULTURE: NO GROWTH

## 2016-10-22 LAB — BASIC METABOLIC PANEL
ANION GAP: 9 (ref 5–15)
BUN: 30 mg/dL — ABNORMAL HIGH (ref 6–20)
CALCIUM: 8.4 mg/dL — AB (ref 8.9–10.3)
CO2: 31 mmol/L (ref 22–32)
Chloride: 104 mmol/L (ref 101–111)
Creatinine, Ser: 1.27 mg/dL — ABNORMAL HIGH (ref 0.61–1.24)
GFR, EST AFRICAN AMERICAN: 59 mL/min — AB (ref >60)
GFR, EST NON AFRICAN AMERICAN: 51 mL/min — AB (ref >60)
Glucose, Bld: 127 mg/dL — ABNORMAL HIGH (ref 65–99)
Potassium: 3.2 mmol/L — ABNORMAL LOW (ref 3.5–5.1)
SODIUM: 144 mmol/L (ref 135–145)

## 2016-10-22 LAB — EXPECTORATED SPUTUM ASSESSMENT W REFEX TO RESP CULTURE

## 2016-10-22 MED ORDER — LEVALBUTEROL HCL 1.25 MG/0.5ML IN NEBU
1.2500 mg | INHALATION_SOLUTION | Freq: Three times a day (TID) | RESPIRATORY_TRACT | Status: DC
Start: 2016-10-22 — End: 2016-10-23
  Administered 2016-10-22 (×3): 1.25 mg via RESPIRATORY_TRACT
  Filled 2016-10-22 (×3): qty 0.5

## 2016-10-22 NOTE — Progress Notes (Signed)
PROGRESS NOTE  Jon Cross RUE:[id number] DOB: Nov 05, 1932 DOA: 10/16/2016 PCP: Joette Catching, MD  Brief History: 83 year old male with a history of Alzheimer's dementia, CKD stage III, atrial fibrillation presented with fevers and confusion. Apparently, the patient was working outside in his yard. Later in the day, the patient was found to have altered mental status with generalized weakness. The patient was found to have temperature 100.39F with WBC 20.5 at the time of admission. Blood cultures are growing MRSA. The patient was started on vancomycin. Lumbar culture was performed on 10/18/2016 and showed WBC14 with 80% neutrophils. The patient was started on empiric acyclovir pending results of his HSV PCR.  Acyclovir was discontinued a 1 stage is any PCR was negative. TEE revealed a mitral valve vegetation. Repeat blood cultures were obtained and remained negative. Unfortunately, the patient developed respiratory failure secondary to aspiration pneumonia. He was started on Unasyn. Repeat chest x-ray on 10/21/2016 showed atelectasis involving almost the entire left lung with diffuse infiltrates of the right lung. Pulmonary was consulted to assist  Assessment/Plan: Sepsis -Secondary to bacteremia -Lactic acid peaked 1.7 -Continue vancomycin -Fluid saline locked secondary to volume overload -leukocytosis improved -remains hemodynamically stable  MRSA bacteremia -Source unclear -10/17/16 TTE--EF 50-55%, grade 1 DD, mild AI/TR, trivial pericardial effusion -Surveillance blood cultures--neg to date -Appreciatecardiology for TEE, discussed with Dr. Purvis Sheffield -place PICC line when surveillance blood cultures neg x 5 days  Native valve endocarditis -10/20/16 TEE--1.5 cm x 1.9 cm vegetation on posterior mitral leaflet, trivial MR -likely a poor candidate for any surgical intervention--case discussed with ID, Dr. Luciana Axe 7/19 -continue vancomycin  Acute respiratory failure with  hypoxia -Secondary to aspiration pneumonia and atelectasis of left lung -Continue Unasyn -Stable on 2 L nasal cannula  Aspiration pneumonia -continue unasyn  Atelectasis of left lung -consult pulmonary--?need for bronchoscopy -start mucomyst  Acute metabolic encephalopathy -Secondary to infectious process -Patient remains confused but  improving -10/18/2016 lumbar puncture cultures negative -HSV PCR negative--discontinue acyclovir  Elevated troponin -due to demand ischemia -no chest pain -personally reviewed EKG--sinus with T wave inversion V4-V6 without change  Acute on chronic renal failure--CKD stage III -Secondary to sepsis and volume depletion -Baseline creatinine 1.2-1.4 -Serum creatinine peaked at 1.78  Dementia without behavioral disturbance -According to the patient's primary care note on 08/17/2016, he wasonNamenda and Aricept -Unclear if the patient was taking is properly -Restart Namenda 5 mg daily and Aricept 5 mg daily when more alert  B12 deficiency -Restart supplementation  Thrombocytopenia -due to sepsis -check fibrinogen--569 -stable-->trending back up -am CBC  Hypokalemia -repleted -check mag--1.7  Goals of Care -palliative medicine following -full scope of treatment   Disposition Plan: SNF in 2-3 days  Family Communication: Son updated on phone 7/19   Consultants: cardiology, pulmonary  Code Status: FULL   DVT Prophylaxis: SCDs   Procedures: As Listed in Progress Note Above  Antibiotics: Azithromycin 7/16 Ceftriaxone 7/16-7/17 Acyclovir 7/17>>>10/21/16 Vancomycin 7/16>>> Unasyn 7/20>>>    Subjective: Patient denies fevers, chills, headache, chest pain, dyspnea, nausea, vomiting, diarrhea, abdominal pain, dysuria. He is pleasantly confused.   Objective: Vitals:   10/21/16 1423 10/21/16 2100 10/22/16 0500 10/22/16 0526  BP: 140/61 (!) 159/67 (!) 166/91   Pulse: 70 76 82   Resp: 20 18 18      Temp: 98.7 F (37.1 C) 98.6 F (37 C) 98.3 F (36.8 C)   TempSrc: Oral  Axillary   SpO2:  93% (!) 83% 91%  Weight:  Height:        Intake/Output Summary (Last 24 hours) at 10/22/16 1143 Last data filed at 10/22/16 0925  Gross per 24 hour  Intake             1020 ml  Output             1900 ml  Net             -880 ml   Weight change:  Exam:   General:  Pt is alert, follows commands appropriately, not in acute distress  HEENT: No icterus, No thrush, No neck mass, /AT  Cardiovascular: RRR, S1/S2, no rubs, no gallops  Respiratory: Diminished breath sounds left lung. Bibasilar crackles. No wheezing. Good air movement.  Abdomen: Soft/+BS, non tender, non distended, no guarding  Extremities: No edema, No lymphangitis, No petechiae, No rashes, no synovitis   Data Reviewed: I have personally reviewed following labs and imaging studies Basic Metabolic Panel:  Recent Labs Lab 10/17/16 0455 10/18/16 0406 10/19/16 0641 10/20/16 0617 10/21/16 0723  NA 134* 142 136 138 138  K 3.9 4.0 3.4* 3.4* 3.6  CL 103 107 104 101 101  CO2 24 25 24 26 28   GLUCOSE 93 75 104* 106* 113*  BUN 17 29* 25* 28* 29*  CREATININE 1.41* 1.78* 1.28* 1.39* 1.23  CALCIUM 8.1* 8.0* 8.0* 8.2* 8.2*  MG  --   --   --  1.7 2.0   Liver Function Tests:  Recent Labs Lab 10/16/16 2212 10/17/16 0455 10/18/16 0406  AST 43* 44* 49*  ALT 34 35 32  ALKPHOS 70 61 47  BILITOT 1.4* 1.3* 0.7  PROT 6.2* 5.6* 4.8*  ALBUMIN 3.4* 3.1* 2.6*   No results for input(s): LIPASE, AMYLASE in the last 168 hours. No results for input(s): AMMONIA in the last 168 hours. Coagulation Profile: No results for input(s): INR, PROTIME in the last 168 hours. CBC:  Recent Labs Lab 10/16/16 2212 10/17/16 0455 10/18/16 0406 10/19/16 0641 10/20/16 0617 10/21/16 0723  WBC 20.5* 12.8* 12.1* 11.5* 9.7 10.2  NEUTROABS 18.1* 12.0*  --   --   --   --   HGB 13.8 13.3 11.9* 12.9* 13.1 13.2  HCT 42.2 39.9 35.8* 38.0*  38.4* 39.3  MCV 97.0 96.4 97.0 94.3 93.7 94.7  PLT 125* 96* 71* 60* 64* 78*   Cardiac Enzymes:  Recent Labs Lab 10/16/16 2212 10/17/16 0049 10/17/16 0455 10/17/16 1004 10/17/16 1606  CKTOTAL 38*  --  64  --   --   TROPONINI 0.06* 0.07* 0.14* 0.21* 0.55*   BNP: Invalid input(s): POCBNP CBG: No results for input(s): GLUCAP in the last 168 hours. HbA1C: No results for input(s): HGBA1C in the last 72 hours. Urine analysis:    Component Value Date/Time   COLORURINE YELLOW 10/16/2016 2313   APPEARANCEUR CLEAR 10/16/2016 2313   LABSPEC 1.018 10/16/2016 2313   PHURINE 7.0 10/16/2016 2313   GLUCOSEU NEGATIVE 10/16/2016 2313   HGBUR NEGATIVE 10/16/2016 2313   BILIRUBINUR NEGATIVE 10/16/2016 2313   KETONESUR NEGATIVE 10/16/2016 2313   PROTEINUR 30 (A) 10/16/2016 2313   NITRITE NEGATIVE 10/16/2016 2313   LEUKOCYTESUR NEGATIVE 10/16/2016 2313   Sepsis Labs: @LABRCNTIP (procalcitonin:4,lacticidven:4) ) Recent Results (from the past 240 hour(s))  Urine culture     Status: None   Collection Time: 10/16/16 11:13 PM  Result Value Ref Range Status   Specimen Description URINE, CLEAN CATCH  Final   Special Requests NONE  Final   Culture   Final  NO GROWTH Performed at [facility], [address], [place], [place] [address]    Report Status 10/18/2016 FINAL  Final  Blood Culture (routine x 2)     Status: Abnormal   Collection Time: 10/17/16 12:37 AM  Result Value Ref Range Status   Specimen Description BLOOD RIGHT ARM  Final   Special Requests   Final    BOTTLES DRAWN AEROBIC AND ANAEROBIC Blood Culture adequate volume   Culture  Setup Time   Final    GRAM POSITIVE COCCI Gram Stain Report Called to,Read Back By and Verified With: BIVENS,L @ 1243 ON 7.16.18 BY BOWMAN,L IN BOTH AEROBIC AND ANAEROBIC BOTTLES    Culture METHICILLIN RESISTANT STAPHYLOCOCCUS AUREUS (A)  Final   Report Status 10/19/2016 FINAL  Final   Organism ID, Bacteria METHICILLIN RESISTANT  STAPHYLOCOCCUS AUREUS  Final      Susceptibility   Methicillin resistant staphylococcus aureus - MIC*    CIPROFLOXACIN <=0.5 SENSITIVE Sensitive     ERYTHROMYCIN >=8 RESISTANT Resistant     GENTAMICIN <=0.5 SENSITIVE Sensitive     OXACILLIN >=4 RESISTANT Resistant     TETRACYCLINE <=1 SENSITIVE Sensitive     VANCOMYCIN 1 SENSITIVE Sensitive     TRIMETH/SULFA <=10 SENSITIVE Sensitive     CLINDAMYCIN <=0.25 SENSITIVE Sensitive     RIFAMPIN <=0.5 SENSITIVE Sensitive     Inducible Clindamycin NEGATIVE Sensitive     * METHICILLIN RESISTANT STAPHYLOCOCCUS AUREUS  Blood Culture ID Panel (Reflexed)     Status: Abnormal   Collection Time: 10/17/16 12:37 AM  Result Value Ref Range Status   Enterococcus species NOT DETECTED NOT DETECTED Final   Listeria monocytogenes NOT DETECTED NOT DETECTED Final   Staphylococcus species DETECTED (A) NOT DETECTED Final    Comment: CRITICAL RESULT CALLED TO, READ BACK BY AND VERIFIED WITH: Hector Brunswick RN, AT 1844 10/17/16 BY D. VANHOOK    Staphylococcus aureus DETECTED (A) NOT DETECTED Final    Comment: Methicillin (oxacillin)-resistant Staphylococcus aureus (MRSA). MRSA is predictably resistant to beta-lactam antibiotics (except ceftaroline). Preferred therapy is vancomycin unless clinically contraindicated. Patient requires contact precautions if  hospitalized. CRITICAL RESULT CALLED TO, READ BACK BY AND VERIFIED WITH: L. BIVENS RN, AT 1844 10/17/16 BY D. VANHOOK    Methicillin resistance DETECTED (A) NOT DETECTED Final    Comment: CRITICAL RESULT CALLED TO, READ BACK BY AND VERIFIED WITH: L. BIVENS RN, AT 1844 10/17/16 BY D. VANHOOK    Streptococcus species NOT DETECTED NOT DETECTED Final   Streptococcus agalactiae NOT DETECTED NOT DETECTED Final   Streptococcus pneumoniae NOT DETECTED NOT DETECTED Final   Streptococcus pyogenes NOT DETECTED NOT DETECTED Final   Acinetobacter baumannii NOT DETECTED NOT DETECTED Final   Enterobacteriaceae species NOT  DETECTED NOT DETECTED Final   Enterobacter cloacae complex NOT DETECTED NOT DETECTED Final   Escherichia coli NOT DETECTED NOT DETECTED Final   Klebsiella oxytoca NOT DETECTED NOT DETECTED Final   Klebsiella pneumoniae NOT DETECTED NOT DETECTED Final   Proteus species NOT DETECTED NOT DETECTED Final   Serratia marcescens NOT DETECTED NOT DETECTED Final   Haemophilus influenzae NOT DETECTED NOT DETECTED Final   Neisseria meningitidis NOT DETECTED NOT DETECTED Final   Pseudomonas aeruginosa NOT DETECTED NOT DETECTED Final   Candida albicans NOT DETECTED NOT DETECTED Final   Candida glabrata NOT DETECTED NOT DETECTED Final   Candida krusei NOT DETECTED NOT DETECTED Final   Candida parapsilosis NOT DETECTED NOT DETECTED Final   Candida tropicalis NOT DETECTED NOT DETECTED Final  Comment: Performed at [facility], [address] [place], [place] [address]  Blood Culture (routine x 2)     Status: Abnormal   Collection Time: 10/17/16 12:46 AM  Result Value Ref Range Status   Specimen Description BLOOD LEFT ARM  Final   Special Requests   Final    BOTTLES DRAWN AEROBIC ONLY Blood Culture results may not be optimal due to an inadequate volume of blood received in culture bottles   Culture  Setup Time   Final    GRAM POSITIVE COCCI Gram Stain Report Called to,Read Back By and Verified With: BIVENS,L @ 1243 ON 7.16.18 BY BOWMAN,L AEROBIC BOTTLE ONLY    Culture (A)  Final    STAPHYLOCOCCUS AUREUS SUSCEPTIBILITIES PERFORMED ON PREVIOUS CULTURE WITHIN THE LAST 5 DAYS. Performed at [facility], [address] [place], [place] [address]    Report Status 10/19/2016 FINAL  Final  Gram stain     Status: None   Collection Time: 10/18/16 10:34 AM  Result Value Ref Range Status   Specimen Description CSF  Final   Special Requests NONE  Final   Gram Stain   Final    CYTOSPIN SMEAR WBC SEEN WBC PRESENT,BOTH PMN AND MONONUCLEAR NO ORGANISMS SEEN   Report Status 10/18/2016 FINAL   Final  CSF culture     Status: None   Collection Time: 10/18/16 10:34 AM  Result Value Ref Range Status   Specimen Description CSF  Final   Special Requests NONE  Final   Gram Stain   Final    CYTOSPIN SMEAR NO ORGANISMS SEEN WBC SEEN WBC PRESENT,BOTH PMN AND MONONUCLEAR    Culture   Final    NO GROWTH 3 DAYS Performed at [facility], [address] [place], [place] [address]    Report Status 10/22/2016 FINAL  Final  Culture, blood (Routine X 2) w Reflex to ID Panel     Status: None (Preliminary result)   Collection Time: 10/19/16  6:41 AM  Result Value Ref Range Status   Specimen Description BLOOD RIGHT ARM  Final   Special Requests Blood Culture adequate volume  Final   Culture NO GROWTH 3 DAYS  Final   Report Status PENDING  Incomplete  Culture, blood (Routine X 2) w Reflex to ID Panel     Status: None (Preliminary result)   Collection Time: 10/19/16  6:52 AM  Result Value Ref Range Status   Specimen Description BLOOD RIGHT HAND  Final   Special Requests Blood Culture adequate volume  Final   Culture NO GROWTH 3 DAYS  Final   Report Status PENDING  Incomplete     Scheduled Meds: . acetylcysteine  4 mL Nebulization TID  . aspirin EC  81 mg Oral Daily  . chlorhexidine  15 mL Mouth Rinse BID  . levalbuterol  1.25 mg Nebulization Q8H  . mouth rinse  15 mL Mouth Rinse q12n4p  . vitamin B-12  500 mcg Oral Daily   Continuous Infusions: . ampicillin-sulbactam (UNASYN) IV Stopped (10/22/16 0715)  . vancomycin Stopped (10/21/16 1222)    Procedures/Studies: Dg Chest 2 View  Result Date: 10/16/2016 CLINICAL DATA:  Altered mental status and weakness after extended time outside. EXAM: CHEST  2 VIEW COMPARISON:  Chest radiograph June 24, 2014 FINDINGS: Cardiac silhouette is moderately enlarged. Calcified aortic knob. Similar mild chronic interstitial changes without pleural effusion or focal consolidation. Increased lung volumes. No pneumothorax. Accentuated kyphosis,  chronic approximate T10 compression fracture.  Osteopenia. IMPRESSION: Stable cardiomegaly and COPD. Electronically Signed   By: Awilda Metro M.D.   On: 10/16/2016 23:34   Ct Head Wo Contrast  Result Date: 10/16/2016 CLINICAL DATA:  83 year old male with altered mental status and weakness today. EXAM: CT HEAD WITHOUT CONTRAST TECHNIQUE: Contiguous axial images were obtained from the base of the skull through the vertex without intravenous contrast. COMPARISON:  None. FINDINGS: Brain: Mild cerebral atrophy. Patchy and confluent areas of decreased attenuation are noted throughout the deep and periventricular white matter of the cerebral hemispheres bilaterally, compatible with chronic microvascular ischemic disease. No evidence of acute infarction, hemorrhage, hydrocephalus, extra-axial collection or mass lesion/mass effect. Vascular: No hyperdense vessel or unexpected calcification. Skull: Normal. Negative for fracture or focal lesion. Sinuses/Orbits: Mild multifocal mucosal thickening in the ethmoid sinuses. No acute finding. Other: None. IMPRESSION: 1. No acute intracranial abnormalities. 2. Mild cerebral atrophy with mild chronic microvascular ischemic changes in the cerebral white matter. Electronically Signed   By: Trudie Reed M.D.   On: 10/16/2016 23:24   Dg Chest Port 1 View  Result Date: 10/21/2016 CLINICAL DATA:  Hypoxia. EXAM: PORTABLE CHEST 1 VIEW COMPARISON:  10/17/2016 . FINDINGS: Cardiomegaly is again noted. Near complete opacification of the left lung with volume loss noted. This is consistent with atelectasis. Diffuse right lung infiltrate and/or edema. Left-sided pleural effusion cannot be excluded. No pneumothorax . IMPRESSION: 1. Near complete opacification left lung with volume loss. This consistent with left lung atelectasis. Underlying infiltrate cannot be excluded. Left-sided pleural effusion cannot be excluded. 2.  Diffuse infiltrate/edema right lung. 3. Persistent  cardiomegaly. Electronically Signed   By: Maisie Fus  Register   On: 10/21/2016 07:44   Dg Chest Port 1 View  Result Date: 10/17/2016 CLINICAL DATA:  83 year old male with fever.  Subsequent encounter. EXAM: PORTABLE CHEST 1 VIEW COMPARISON:  10/16/2016. FINDINGS: Cardiomegaly. Pulmonary vascular congestion. No segmental consolidation. Slightly limited evaluation of retrocardiac region. Calcified slightly tortuous aorta. IMPRESSION: Cardiomegaly. Pulmonary vascular congestion. No segmental consolidation. Slightly limited evaluation of retrocardiac region. Electronically Signed   By: Lacy Duverney M.D.   On: 10/17/2016 14:42   Dg Swallowing Func-speech Pathology  Result Date: 10/22/2016 Objective Swallowing Evaluation: Type of Study: MBS-Modified Barium Swallow Study Patient Details Name: IAAN OREGEL MRN: [id number] Date of Birth: 1932/12/10 Today's Date: 10/22/2016 Time: SLP Start Time (ACUTE ONLY): 0800-SLP Stop Time (ACUTE ONLY): 0915 SLP Time Calculation (min) (ACUTE ONLY): 75 min Past Medical History: Past Medical History: Diagnosis Date . Alzheimer disease  . Atrial fibrillation (HCC)  . Essential hypertension, benign  . Herpes zoster  . Staphylococcal skin infection  Past Surgical History: Past Surgical History: Procedure Laterality Date . INGUINAL HERNIA REPAIR   . TEE WITHOUT CARDIOVERSION N/A 10/20/2016  Procedure: TRANSESOPHAGEAL ECHOCARDIOGRAM (TEE) WITH PROPOFOL;  Surgeon: Laqueta Linden, MD;  Location: [facility];  Service: Cardiovascular;  Laterality: N/A; . TIBIA FRACTURE SURGERY    Open reduction and internal fixation of left tibia fracture . TONSILLECTOMY   . UMBILICAL HERNIA REPAIR   HPI: Barnabas Henriques Mooreis a 83 y.o.malewith history of dementia, chronic kidney disease stage II A. fib as per the charts was brought to the ER. Patient to follow very weak and mildly confused. As per the report patient has been working in his yard today and [place] when patient was found to  be weak and confused. Patient has not complained of any chest pain or shortness of breath nausea vomiting or diarrhea. Has been having  persistent cough.In the ER patient is being found to have productive cough is oriented to his name only. Chest x-ray and UA are unremarkable. Patient was tachycardic and febrile with temperatures around 103F. No signs of meningitis. Patient had blood cultures drawn and since patient was having productive cough and began antibiotics for pneumonia were started. Troponin is found to be very elevated. EKG showssinus tachycardia.CT of the head was unremarkable. BSE ordered. Subjective: Pt alert and c/o right eye discomfort Assessment / Plan / Recommendation CHL IP CLINICAL IMPRESSIONS 10/22/2016 Clinical Impression Pt presents with mild oropharyngeal dysphagia and mild pharyngoesophageal dysphagia characterized by prolonged oral phase with lingual pumping and inefficient bolus prep with solids, decreased velopharyngeal closure with variable velopharyngeal regurgitation with thins, reduced tongue base retraction and pharyngeal constriction, reduced cricopharyngeal relaxation, and prominent cricopharyngeus resulting in residuals post swallow along posterior pharyngeal wall, pyriforms, and cricopharyngeus across all textures and consistencies (mild to moderate amount). Residuals were reduced with cued repeat/dry swallows (not completely eliminated). Pt also benefited from liquid wash (following puree/mech soft with sip of liquid) to reduce residuals. Pt does appear to have bony protrusion (osteophytes) near C4-5, but does not impinge greatly into pharyngeal space, but could impact pharyngeal pressure given current weakness. Pt had a single episode of trace laryngeal penetration with straw sips thin (when co-occurring velopharyngeal backflow noted) with trace amount of thins entering interarytenoid space, but was immediately expelled. Pt assessed with both thin and nectars via cup and straw  sips and self presented when able. No aspiration observed, however pt with fairly consistent throat clear/soft cough after many of his swallows. Pt with prominent cricopharyngeus with reduced relaxation resulting in tail end of bolus remaining near CP and posterior pharyngeal wall. Recommend D1/puree with thin liquids, crush meds as able in puree. Straws OK. Prognosis for advancing diet textures is good with improved endurance and strength. D/W Dr. Arbutus Leas, and pt medical status tenuous at this time. Recommend po only when alert and upright. RN to help with oral care and brush tongue. F/U SLP at SNF. SLP Visit Diagnosis Dysphagia, oropharyngeal phase (R13.12) Attention and concentration deficit following -- Frontal lobe and executive function deficit following -- Impact on safety and function Mild aspiration risk;Moderate aspiration risk   CHL IP TREATMENT RECOMMENDATION 10/22/2016 Treatment Recommendations Therapy as outlined in treatment plan below   Prognosis 10/22/2016 Prognosis for Safe Diet Advancement Fair Barriers to Reach Goals (No Data) Barriers/Prognosis Comment -- CHL IP DIET RECOMMENDATION 10/22/2016 SLP Diet Recommendations Dysphagia 1 (Puree) solids;Thin liquid Liquid Administration via Straw;Cup Medication Administration Crushed with puree Compensations Slow rate;Multiple dry swallows after each bite/sip;Follow solids with liquid;Effortful swallow Postural Changes Remain semi-upright after after feeds/meals (Comment);Seated upright at 90 degrees   CHL IP OTHER RECOMMENDATIONS 10/22/2016 Recommended Consults -- Oral Care Recommendations Oral care BID;Staff/trained caregiver to provide oral care Other Recommendations Clarify dietary restrictions   CHL IP FOLLOW UP RECOMMENDATIONS 10/22/2016 Follow up Recommendations Skilled Nursing facility   [facility] IP FREQUENCY AND DURATION 10/22/2016 Speech Therapy Frequency (ACUTE ONLY) min 2x/week Treatment Duration 1 week      CHL IP ORAL PHASE 10/22/2016 Oral Phase Impaired  Oral - Pudding Teaspoon -- Oral - Pudding Cup -- Oral - Honey Teaspoon -- Oral - Honey Cup -- Oral - Nectar Teaspoon -- Oral - Nectar Cup -- Oral - Nectar Straw -- Oral - Thin Teaspoon -- Oral - Thin Cup -- Oral - Thin Straw Decreased velopharyngeal closure;Nasal reflux Oral - Puree Weak lingual manipulation;Lingual pumping;Delayed oral transit Oral -  Mech Soft -- Oral - Regular -- Oral - Multi-Consistency -- Oral - Pill -- Oral Phase - Comment --  CHL IP PHARYNGEAL PHASE 10/22/2016 Pharyngeal Phase Impaired Pharyngeal- Pudding Teaspoon -- Pharyngeal -- Pharyngeal- Pudding Cup -- Pharyngeal -- Pharyngeal- Honey Teaspoon -- Pharyngeal -- Pharyngeal- Honey Cup -- Pharyngeal -- Pharyngeal- Nectar Teaspoon -- Pharyngeal -- Pharyngeal- Nectar Cup -- Pharyngeal -- Pharyngeal- Nectar Straw Pharyngeal residue - pyriform;Pharyngeal residue - posterior pharnyx;Pharyngeal residue - cp segment;Reduced tongue base retraction;Pharyngeal residue - valleculae Pharyngeal -- Pharyngeal- Thin Teaspoon -- Pharyngeal -- Pharyngeal- Thin Cup Reduced tongue base retraction;Pharyngeal residue - cp segment;Pharyngeal residue - pyriform Pharyngeal -- Pharyngeal- Thin Straw Reduced tongue base retraction;Penetration/Aspiration during swallow;Pharyngeal residue - pyriform;Pharyngeal residue - cp segment;Nasopharyngeal reflux Pharyngeal Material enters airway, remains ABOVE vocal cords then ejected out;Material does not enter airway Pharyngeal- Puree Delayed swallow initiation-vallecula;Reduced tongue base retraction;Pharyngeal residue - valleculae;Pharyngeal residue - posterior pharnyx;Pharyngeal residue - cp segment;Reduced pharyngeal peristalsis Pharyngeal -- Pharyngeal- Mechanical Soft Reduced pharyngeal peristalsis;Reduced tongue base retraction;Pharyngeal residue - valleculae;Pharyngeal residue - cp segment Pharyngeal -- Pharyngeal- Regular -- Pharyngeal -- Pharyngeal- Multi-consistency -- Pharyngeal -- Pharyngeal- Pill NT Pharyngeal --  Pharyngeal Comment reduced CP relaxation with resultant CP/PPW residue post swallow  CHL IP CERVICAL ESOPHAGEAL PHASE 10/22/2016 Cervical Esophageal Phase Impaired Pudding Teaspoon -- Pudding Cup -- Honey Teaspoon -- Honey Cup -- Nectar Teaspoon -- Nectar Cup -- Nectar Straw Reduced cricopharyngeal relaxation;Prominent cricopharyngeal segment Thin Teaspoon -- Thin Cup -- Thin Straw Reduced cricopharyngeal relaxation;Prominent cricopharyngeal segment Puree Reduced cricopharyngeal relaxation;Prominent cricopharyngeal segment Mechanical Soft -- Regular -- Multi-consistency -- Pill -- Cervical Esophageal Comment tail of bolus remains near UES and PPW Thank you, Havery Moros, CCC-SLP [phone] PORTER,DABNEY 10/22/2016, 9:48 AM              Dg Fluoro Guide Lumbar Puncture  Result Date: 10/18/2016 CLINICAL DATA:  Fever EXAM: DIAGNOSTIC LUMBAR PUNCTURE UNDER FLUOROSCOPIC GUIDANCE FLUOROSCOPY TIME:  Fluoroscopy Time:  0 minutes 30 seconds Radiation Exposure Index (if provided by the fluoroscopic device): 4.7 mGy Number of Acquired Spot Images: 2 screen captures during fluoroscopy PROCEDURE: Procedure, benefits, and risks were discussed with the patient, including alternatives. Patient's questions were answered. Written informed consent was obtained by the service from the patient's son. Timeout protocol followed. Patient placed prone. L4-L5 disc space was localized under fluoroscopy. Skin prepped and draped in usual sterile fashion. Skin and soft tissues anesthetized with 2 mL of 1% lidocaine. A 22 gauge needle was advanced into the spinal canal where clear colorless CSF was encountered. 10.5 mL of CSF was obtained in 4 tubes for requested analysis. Procedure tolerated very well by patient without immediate complication. IMPRESSION: Successful fluoroscopic guided lumbar puncture as above. Electronically Signed   By: Ulyses Southward M.D.   On: 10/18/2016 11:42    Bryar Dahms, DO  [facility] Pager  [phone]  If 7PM-7AM, please contact night-coverage [email or web address] Password [email or web address] 10/22/2016, 11:43 AM   LOS: 5 days

## 2016-10-22 NOTE — Progress Notes (Signed)
Modified Barium Swallow Progress Note  Patient Details  Name: Jon Cross MRN: [id number] Date of Birth: 01-29-1933  Today's Date: 10/22/2016  Modified Barium Swallow completed.  Full report located under Chart Review in the Imaging Section.  Brief recommendations include the following:  Clinical Impression  Pt presents with mild oropharyngeal dysphagia and mild pharyngoesophageal dysphagia characterized by prolonged oral phase with lingual pumping and inefficient bolus prep with solids, decreased velopharyngeal closure with variable velopharyngeal regurgitation with thins, reduced tongue base retraction and pharyngeal constriction, reduced cricopharyngeal relaxation, and prominent cricopharyngeus resulting in residuals post swallow along posterior pharyngeal wall, pyriforms, and cricopharyngeus across all textures and consistencies (mild to moderate amount). Residuals were reduced with cued repeat/dry swallows (not completely eliminated). Pt also benefited from liquid wash (following puree/mech soft with sip of liquid) to reduce residuals. Pt does appear to have bony protrusion (osteophytes) near C4-5, but does not impinge greatly into pharyngeal space, but could impact pharyngeal pressure given current weakness. Pt had a single episode of trace laryngeal penetration with straw sips thin (when co-occurring velopharyngeal backflow noted) with trace amount of thins entering interarytenoid space, but was immediately expelled.   Pt assessed with both thin and nectars via cup and straw sips and self presented when able. No aspiration observed, however pt with fairly consistent throat clear/soft cough after many of his swallows. Pt with prominent cricopharyngeus with reduced relaxation resulting in tail end of bolus remaining near CP and posterior pharyngeal wall. Recommend D1/puree with thin liquids, crush meds as able in puree. Straws OK. Prognosis for advancing diet textures is good with improved  endurance and strength. D/W Dr. Arbutus Leasat, and pt medical status tenuous at this time. Recommend po only when alert and upright. RN to help with oral care and brush tongue. Encourage effortful swallow with purees and cue Pt to swallow 2x for each bite/sip. F/U SLP at [facility].   Swallow Evaluation Recommendations       SLP Diet Recommendations: Dysphagia 1 (Puree) solids;Thin liquid   Liquid Administration via: Straw;Cup   Medication Administration: Crushed with puree   Supervision: Patient able to self feed;Staff to assist with self feeding;Full supervision/cueing for compensatory strategies   Compensations: Slow rate;Multiple dry swallows after each bite/sip;Follow solids with liquid;Effortful swallow   Postural Changes: Remain semi-upright after after feeds/meals (Comment);Seated upright at 90 degrees   Oral Care Recommendations: Oral care BID;Staff/trained caregiver to provide oral care   Other Recommendations: Clarify dietary restrictions   Thank you,  Havery MorosDabney Anaiza Behrens, CCC-SLP [phone]  Jon Cross 10/22/2016,9:59 AM

## 2016-10-22 NOTE — Consult Note (Signed)
Consult requested by: [facility], Dr. Arbutus Leas Consult requested for: Abnormal chest x-ray  HPI: This is an 83 year old who has multiple medical problems including Alzheimer's disease atrial fib hypertension chronic kidney disease stage III. He came to the hospital because he had altered mental status and generalized weakness. When he came to the emergency department he had a fever elevated white blood cell count and blood cultures have grown MRSA. TEE shows a mitral valve vegetation. He has had aspiration pneumonia. He is on Unasyn. Chest x-ray on 720 showed atelectasis of almost the entire left lung versus a possibility of pleural effusion. When I came into his room he is awake and able to answer questions but I'm not sure if his responses are accurate because of his history of dementia. He says he feels okay. He doesn't complain of shortness of breath. He is coughing some during the exam.  Past Medical History:  Diagnosis Date  . Alzheimer disease   . Atrial fibrillation (HCC)   . Essential hypertension, benign   . Herpes zoster   . Staphylococcal skin infection      Family History  Problem Relation Age of Onset  . Cancer Unknown    No other family history obtainable  Social History   Social History  . Marital status: Single    Spouse name: N/A  . Number of children: N/A  . Years of education: N/A   Social History Main Topics  . Smoking status: Current Every Day Smoker    Packs/day: 1.00    Years: 31.00    Types: Cigarettes  . Smokeless tobacco: Never Used     Comment: quit smoking for 20 years  . Alcohol use Yes  . Drug use: No  . Sexual activity: Not Asked   Other Topics Concern  . None   Social History Narrative  . None     ROS: Not obtainable    Objective: Vital signs in last 24 hours: Temp:  [98.3 F (36.8 C)-98.7 F (37.1 C)] 98.3 F (36.8 C) (07/21 0500) Pulse Rate:  [70-82] 82 (07/21 0500) Resp:  [18-20] 18 (07/21 0500) BP: (140-166)/(61-91)  166/91 (07/21 0500) SpO2:  [83 %-93 %] 91 % (07/21 0526) FiO2 (%):  [92 %] 92 % (07/21 0913) Weight change:  Last BM Date: 10/19/16  Intake/Output from previous day: 07/20 0701 - 07/21 0700 In: 1140 [P.O.:840; IV Piggyback:300] Out: 1900 [Urine:1900]  PHYSICAL EXAM Constitutional: He is a thin male who is in no acute distress. Eyes: He has iscoloration of his  right eye. Ears nose mouth and throat: Throat is clear. Hearing is grossly normal. Cardiovascular: He is in atrial fib. No significant edema. Respiratory: He has decreased breath sounds on the left and rhonchi on the right. He looks comfortable however. Gastrointestinal: His abdomen is soft with no masses. Skin: Warm and dry. Neurological: He seems confused. Psychiatric: Not able to assess. A skilled skeletal: He has generalized weakness  Lab Results: Basic Metabolic Panel:  Recent Labs  29/56/21 0617 10/21/16 0723  NA 138 138  K 3.4* 3.6  CL 101 101  CO2 26 28  GLUCOSE 106* 113*  BUN 28* 29*  CREATININE 1.39* 1.23  CALCIUM 8.2* 8.2*  MG 1.7 2.0   Liver Function Tests: No results for input(s): AST, ALT, ALKPHOS, BILITOT, PROT, ALBUMIN in the last 72 hours. No results for input(s): LIPASE, AMYLASE in the last 72 hours. No results for input(s): AMMONIA in the last 72 hours. CBC:  Recent Labs  10/20/16  0617 10/21/16 0723  WBC 9.7 10.2  HGB 13.1 13.2  HCT 38.4* 39.3  MCV 93.7 94.7  PLT 64* 78*   Cardiac Enzymes: No results for input(s): CKTOTAL, CKMB, CKMBINDEX, TROPONINI in the last 72 hours. BNP: No results for input(s): PROBNP in the last 72 hours. D-Dimer: No results for input(s): DDIMER in the last 72 hours. CBG: No results for input(s): GLUCAP in the last 72 hours. Hemoglobin A1C: No results for input(s): HGBA1C in the last 72 hours. Fasting Lipid Panel: No results for input(s): CHOL, HDL, LDLCALC, TRIG, CHOLHDL, LDLDIRECT in the last 72 hours. Thyroid Function Tests: No results for input(s): TSH,  T4TOTAL, FREET4, T3FREE, THYROIDAB in the last 72 hours. Anemia Panel:  Recent Labs  10/21/16 0723  VITAMINB12 1,574*   Coagulation: No results for input(s): LABPROT, INR in the last 72 hours. Urine Drug Screen: Drugs of Abuse  No results found for: LABOPIA, COCAINSCRNUR, LABBENZ, AMPHETMU, THCU, LABBARB  Alcohol Level: No results for input(s): ETH in the last 72 hours. Urinalysis: No results for input(s): COLORURINE, LABSPEC, PHURINE, GLUCOSEU, HGBUR, BILIRUBINUR, KETONESUR, PROTEINUR, UROBILINOGEN, NITRITE, LEUKOCYTESUR in the last 72 hours.  Invalid input(s): APPERANCEUR Misc. Labs:   ABGS: No results for input(s): PHART, PO2ART, TCO2, HCO3 in the last 72 hours.  Invalid input(s): PCO2   MICROBIOLOGY: Recent Results (from the past 240 hour(s))  Urine culture     Status: None   Collection Time: 10/16/16 11:13 PM  Result Value Ref Range Status   Specimen Description URINE, CLEAN CATCH  Final   Special Requests NONE  Final   Culture   Final    NO GROWTH Performed at [facility], [address], [place], [place] [address]    Report Status 10/18/2016 FINAL  Final  Blood Culture (routine x 2)     Status: Abnormal   Collection Time: 10/17/16 12:37 AM  Result Value Ref Range Status   Specimen Description BLOOD RIGHT ARM  Final   Special Requests   Final    BOTTLES DRAWN AEROBIC AND ANAEROBIC Blood Culture adequate volume   Culture  Setup Time   Final    GRAM POSITIVE COCCI Gram Stain Report Called to,Read Back By and Verified With: BIVENS,L @ 1243 ON 7.16.18 BY BOWMAN,L IN BOTH AEROBIC AND ANAEROBIC BOTTLES    Culture METHICILLIN RESISTANT STAPHYLOCOCCUS AUREUS (A)  Final   Report Status 10/19/2016 FINAL  Final   Organism ID, Bacteria METHICILLIN RESISTANT STAPHYLOCOCCUS AUREUS  Final      Susceptibility   Methicillin resistant staphylococcus aureus - MIC*    CIPROFLOXACIN <=0.5 SENSITIVE Sensitive     ERYTHROMYCIN >=8 RESISTANT Resistant     GENTAMICIN  <=0.5 SENSITIVE Sensitive     OXACILLIN >=4 RESISTANT Resistant     TETRACYCLINE <=1 SENSITIVE Sensitive     VANCOMYCIN 1 SENSITIVE Sensitive     TRIMETH/SULFA <=10 SENSITIVE Sensitive     CLINDAMYCIN <=0.25 SENSITIVE Sensitive     RIFAMPIN <=0.5 SENSITIVE Sensitive     Inducible Clindamycin NEGATIVE Sensitive     * METHICILLIN RESISTANT STAPHYLOCOCCUS AUREUS  Blood Culture ID Panel (Reflexed)     Status: Abnormal   Collection Time: 10/17/16 12:37 AM  Result Value Ref Range Status   Enterococcus species NOT DETECTED NOT DETECTED Final   Listeria monocytogenes NOT DETECTED NOT DETECTED Final   Staphylococcus species DETECTED (A) NOT DETECTED Final    Comment: CRITICAL RESULT CALLED TO, READ BACK BY AND VERIFIED WITH: Hector Brunswick RN, AT [phone] 10/17/16  BY D. VANHOOK    Staphylococcus aureus DETECTED (A) NOT DETECTED Final    Comment: Methicillin (oxacillin)-resistant Staphylococcus aureus (MRSA). MRSA is predictably resistant to beta-lactam antibiotics (except ceftaroline). Preferred therapy is vancomycin unless clinically contraindicated. Patient requires contact precautions if  hospitalized. CRITICAL RESULT CALLED TO, READ BACK BY AND VERIFIED WITH: L. BIVENS RN, AT 1844 10/17/16 BY D. VANHOOK    Methicillin resistance DETECTED (A) NOT DETECTED Final    Comment: CRITICAL RESULT CALLED TO, READ BACK BY AND VERIFIED WITH: L. BIVENS RN, AT 1844 10/17/16 BY D. VANHOOK    Streptococcus species NOT DETECTED NOT DETECTED Final   Streptococcus agalactiae NOT DETECTED NOT DETECTED Final   Streptococcus pneumoniae NOT DETECTED NOT DETECTED Final   Streptococcus pyogenes NOT DETECTED NOT DETECTED Final   Acinetobacter baumannii NOT DETECTED NOT DETECTED Final   Enterobacteriaceae species NOT DETECTED NOT DETECTED Final   Enterobacter cloacae complex NOT DETECTED NOT DETECTED Final   Escherichia coli NOT DETECTED NOT DETECTED Final   Klebsiella oxytoca NOT DETECTED NOT DETECTED Final   Klebsiella  pneumoniae NOT DETECTED NOT DETECTED Final   Proteus species NOT DETECTED NOT DETECTED Final   Serratia marcescens NOT DETECTED NOT DETECTED Final   Haemophilus influenzae NOT DETECTED NOT DETECTED Final   Neisseria meningitidis NOT DETECTED NOT DETECTED Final   Pseudomonas aeruginosa NOT DETECTED NOT DETECTED Final   Candida albicans NOT DETECTED NOT DETECTED Final   Candida glabrata NOT DETECTED NOT DETECTED Final   Candida krusei NOT DETECTED NOT DETECTED Final   Candida parapsilosis NOT DETECTED NOT DETECTED Final   Candida tropicalis NOT DETECTED NOT DETECTED Final    Comment: Performed at [facility], [address] [place], [place], [place] [address]  Blood Culture (routine x 2)     Status: Abnormal   Collection Time: 10/17/16 12:46 AM  Result Value Ref Range Status   Specimen Description BLOOD LEFT ARM  Final   Special Requests   Final    BOTTLES DRAWN AEROBIC ONLY Blood Culture results may not be optimal due to an inadequate volume of blood received in culture bottles   Culture  Setup Time   Final    GRAM POSITIVE COCCI Gram Stain Report Called to,Read Back By and Verified With: BIVENS,L @ 1243 ON 7.16.18 BY BOWMAN,L AEROBIC BOTTLE ONLY    Culture (A)  Final    STAPHYLOCOCCUS AUREUS SUSCEPTIBILITIES PERFORMED ON PREVIOUS CULTURE WITHIN THE LAST 5 DAYS. Performed at [facility], [address], [place], [place] [address]    Report Status 10/19/2016 FINAL  Final  Gram stain     Status: None   Collection Time: 10/18/16 10:34 AM  Result Value Ref Range Status   Specimen Description CSF  Final   Special Requests NONE  Final   Gram Stain   Final    CYTOSPIN SMEAR WBC SEEN WBC PRESENT,BOTH PMN AND MONONUCLEAR NO ORGANISMS SEEN   Report Status 10/18/2016 FINAL  Final  CSF culture     Status: None   Collection Time: 10/18/16 10:34 AM  Result Value Ref Range Status   Specimen Description CSF  Final   Special Requests NONE  Final   Gram Stain   Final    CYTOSPIN  SMEAR NO ORGANISMS SEEN WBC SEEN WBC PRESENT,BOTH PMN AND MONONUCLEAR    Culture   Final    NO GROWTH 3 DAYS Performed at [facility], [address] [place], [place] [address]    Report Status 10/22/2016  FINAL  Final  Culture, blood (Routine X 2) w Reflex to ID Panel     Status: None (Preliminary result)   Collection Time: 10/19/16  6:41 AM  Result Value Ref Range Status   Specimen Description BLOOD RIGHT ARM  Final   Special Requests Blood Culture adequate volume  Final   Culture NO GROWTH 3 DAYS  Final   Report Status PENDING  Incomplete  Culture, blood (Routine X 2) w Reflex to ID Panel     Status: None (Preliminary result)   Collection Time: 10/19/16  6:52 AM  Result Value Ref Range Status   Specimen Description BLOOD RIGHT HAND  Final   Special Requests Blood Culture adequate volume  Final   Culture NO GROWTH 3 DAYS  Final   Report Status PENDING  Incomplete    Studies/Results: Dg Chest Port 1 View  Result Date: 10/21/2016 CLINICAL DATA:  Hypoxia. EXAM: PORTABLE CHEST 1 VIEW COMPARISON:  10/17/2016 . FINDINGS: Cardiomegaly is again noted. Near complete opacification of the left lung with volume loss noted. This is consistent with atelectasis. Diffuse right lung infiltrate and/or edema. Left-sided pleural effusion cannot be excluded. No pneumothorax . IMPRESSION: 1. Near complete opacification left lung with volume loss. This consistent with left lung atelectasis. Underlying infiltrate cannot be excluded. Left-sided pleural effusion cannot be excluded. 2.  Diffuse infiltrate/edema right lung. 3. Persistent cardiomegaly. Electronically Signed   By: Maisie Fus  Register   On: 10/21/2016 07:44   Dg Swallowing Func-speech Pathology  Result Date: 10/22/2016 Objective Swallowing Evaluation: Type of Study: MBS-Modified Barium Swallow Study Patient Details Name: Jon Cross MRN: [id number] Date of Birth: [birth year] Today's Date: 10/22/2016 Time: SLP Start Time (ACUTE ONLY): 0800-SLP  Stop Time (ACUTE ONLY): 0915 SLP Time Calculation (min) (ACUTE ONLY): 75 min Past Medical History: Past Medical History: Diagnosis Date . Alzheimer disease  . Atrial fibrillation (HCC)  . Essential hypertension, benign  . Herpes zoster  . Staphylococcal skin infection  Past Surgical History: Past Surgical History: Procedure Laterality Date . INGUINAL HERNIA REPAIR   . TEE WITHOUT CARDIOVERSION N/A 10/20/2016  Procedure: TRANSESOPHAGEAL ECHOCARDIOGRAM (TEE) WITH PROPOFOL;  Surgeon: Laqueta Linden, MD;  Location: [facility];  Service: Cardiovascular;  Laterality: N/A; . TIBIA FRACTURE SURGERY    Open reduction and internal fixation of left tibia fracture . TONSILLECTOMY   . UMBILICAL HERNIA REPAIR   HPI: Morton Simson Mooreis a 83 y.o.malewith history of dementia, chronic kidney disease stage II A. fib as per the charts was brought to the ER. Patient to follow very weak and mildly confused. As per the report patient has been working in his yard today and [place] when patient was found to be weak and confused. Patient has not complained of any chest pain or shortness of breath nausea vomiting or diarrhea. Has been having persistent cough.In the ER patient is being found to have productive cough is oriented to his name only. Chest x-ray and UA are unremarkable. Patient was tachycardic and febrile with temperatures around 103F. No signs of meningitis. Patient had blood cultures drawn and since patient was having productive cough and began antibiotics for pneumonia were started. Troponin is found to be very elevated. EKG showssinus tachycardia.CT of the head was unremarkable. BSE ordered. Subjective: Pt alert and c/o right eye discomfort Assessment / Plan / Recommendation CHL IP CLINICAL IMPRESSIONS 10/22/2016 Clinical Impression Pt presents with mild oropharyngeal dysphagia and mild pharyngoesophageal dysphagia characterized by prolonged oral phase with lingual pumping and  inefficient bolus prep  with solids, decreased velopharyngeal closure with variable velopharyngeal regurgitation with thins, reduced tongue base retraction and pharyngeal constriction, reduced cricopharyngeal relaxation, and prominent cricopharyngeus resulting in residuals post swallow along posterior pharyngeal wall, pyriforms, and cricopharyngeus across all textures and consistencies (mild to moderate amount). Residuals were reduced with cued repeat/dry swallows (not completely eliminated). Pt also benefited from liquid wash (following puree/mech soft with sip of liquid) to reduce residuals. Pt does appear to have bony protrusion (osteophytes) near C4-5, but does not impinge greatly into pharyngeal space, but could impact pharyngeal pressure given current weakness. Pt had a single episode of trace laryngeal penetration with straw sips thin (when co-occurring velopharyngeal backflow noted) with trace amount of thins entering interarytenoid space, but was immediately expelled. Pt assessed with both thin and nectars via cup and straw sips and self presented when able. No aspiration observed, however pt with fairly consistent throat clear/soft cough after many of his swallows. Pt with prominent cricopharyngeus with reduced relaxation resulting in tail end of bolus remaining near CP and posterior pharyngeal wall. Recommend D1/puree with thin liquids, crush meds as able in puree. Straws OK. Prognosis for advancing diet textures is good with improved endurance and strength. D/W Dr. Arbutus Leasat, and pt medical status tenuous at this time. Recommend po only when alert and upright. RN to help with oral care and brush tongue. F/U SLP at SNF. SLP Visit Diagnosis Dysphagia, oropharyngeal phase (R13.12) Attention and concentration deficit following -- Frontal lobe and executive function deficit following -- Impact on safety and function Mild aspiration risk;Moderate aspiration risk   CHL IP TREATMENT RECOMMENDATION 10/22/2016 Treatment Recommendations Therapy  as outlined in treatment plan below   Prognosis 10/22/2016 Prognosis for Safe Diet Advancement Fair Barriers to Reach Goals (No Data) Barriers/Prognosis Comment -- CHL IP DIET RECOMMENDATION 10/22/2016 SLP Diet Recommendations Dysphagia 1 (Puree) solids;Thin liquid Liquid Administration via Straw;Cup Medication Administration Crushed with puree Compensations Slow rate;Multiple dry swallows after each bite/sip;Follow solids with liquid;Effortful swallow Postural Changes Remain semi-upright after after feeds/meals (Comment);Seated upright at 90 degrees   CHL IP OTHER RECOMMENDATIONS 10/22/2016 Recommended Consults -- Oral Care Recommendations Oral care BID;Staff/trained caregiver to provide oral care Other Recommendations Clarify dietary restrictions   CHL IP FOLLOW UP RECOMMENDATIONS 10/22/2016 Follow up Recommendations [facility] D[facility] 10/22/2016 Speech Therapy Frequency (ACUTE ONLY) min 2x/week Treatment Duration 1 week      CHL IP ORAL PHASE 10/22/2016 Oral Phase Impaired Oral - Pudding Teaspoon -- Oral - Pudding Cup -- Oral - Honey Teaspoon -- Oral - Honey Cup -- Oral - Nectar Teaspoon -- Oral - Nectar Cup -- Oral - Nectar Straw -- Oral - Thin Teaspoon -- Oral - Thin Cup -- Oral - Thin Straw Decreased velopharyngeal closure;Nasal reflux Oral - Puree Weak lingual manipulation;Lingual pumping;Delayed oral transit Oral - Mech Soft -- Oral - Regular -- Oral - Multi-Consistency -- Oral - Pill -- Oral Phase - Comment --  CHL IP PHARYNGEAL PHASE 10/22/2016 Pharyngeal Phase Impaired Pharyngeal- Pudding Teaspoon -- Pharyngeal -- Pharyngeal- Pudding Cup -- Pharyngeal -- Pharyngeal- Honey Teaspoon -- Pharyngeal -- Pharyngeal- Honey Cup -- Pharyngeal -- Pharyngeal- Nectar Teaspoon -- Pharyngeal -- Pharyngeal- Nectar Cup -- Pharyngeal -- Pharyngeal- Nectar Straw Pharyngeal residue - pyriform;Pharyngeal residue - posterior pharnyx;Pharyngeal residue - cp segment;Reduced tongue base  retraction;Pharyngeal residue - valleculae Pharyngeal -- Pharyngeal- Thin Teaspoon -- Pharyngeal -- Pharyngeal- Thin Cup Reduced tongue base retraction;Pharyngeal residue - cp segment;Pharyngeal residue - pyriform Pharyngeal --  Pharyngeal- Thin Straw Reduced tongue base retraction;Penetration/Aspiration during swallow;Pharyngeal residue - pyriform;Pharyngeal residue - cp segment;Nasopharyngeal reflux Pharyngeal Material enters airway, remains ABOVE vocal cords then ejected out;Material does not enter airway Pharyngeal- Puree Delayed swallow initiation-vallecula;Reduced tongue base retraction;Pharyngeal residue - valleculae;Pharyngeal residue - posterior pharnyx;Pharyngeal residue - cp segment;Reduced pharyngeal peristalsis Pharyngeal -- Pharyngeal- Mechanical Soft Reduced pharyngeal peristalsis;Reduced tongue base retraction;Pharyngeal residue - valleculae;Pharyngeal residue - cp segment Pharyngeal -- Pharyngeal- Regular -- Pharyngeal -- Pharyngeal- Multi-consistency -- Pharyngeal -- Pharyngeal- Pill NT Pharyngeal -- Pharyngeal Comment reduced CP relaxation with resultant CP/PPW residue post swallow  CHL IP CERVICAL ESOPHAGEAL PHASE 10/22/2016 Cervical Esophageal Phase Impaired Pudding Teaspoon -- Pudding Cup -- Honey Teaspoon -- Honey Cup -- Nectar Teaspoon -- Nectar Cup -- Nectar Straw Reduced cricopharyngeal relaxation;Prominent cricopharyngeal segment Thin Teaspoon -- Thin Cup -- Thin Straw Reduced cricopharyngeal relaxation;Prominent cricopharyngeal segment Puree Reduced cricopharyngeal relaxation;Prominent cricopharyngeal segment Mechanical Soft -- Regular -- Multi-consistency -- Pill -- Cervical Esophageal Comment tail of bolus remains near UES and PPW Thank you, Havery Moros, CCC-SLP [phone] PORTER,DABNEY 10/22/2016, 9:48 AM               Medications:  Prior to Admission:  Prescriptions Prior to Admission  Medication Sig Dispense Refill Last Dose  . memantine (NAMENDA) 5 MG tablet Take 5 mg by  mouth daily.     Marland Kitchen acetaminophen (TYLENOL) 500 MG tablet Take 500 mg by mouth every 6 (six) hours as needed.     Marland Kitchen aspirin EC 81 MG tablet Take 81 mg by mouth daily.   Taking   Scheduled: . acetylcysteine  4 mL Nebulization TID  . aspirin EC  81 mg Oral Daily  . chlorhexidine  15 mL Mouth Rinse BID  . levalbuterol  1.25 mg Nebulization Q8H  . mouth rinse  15 mL Mouth Rinse q12n4p  . vitamin B-12  500 mcg Oral Daily   Continuous: . ampicillin-sulbactam (UNASYN) IV Stopped (10/22/16 0715)  . vancomycin Stopped (10/21/16 1222)   UJW:[id number] **OR** acetaminophen  Assesment:He was admitted with sepsis due to MRSA. He has vegetation on his heart valve. He's been treated with IV antibiotics. He has Alzheimer's disease and has high risk for aspiration. I think he has probably aspirated and he is currently being treated with conservative treatment as he is hemodynamically stable and in in no acute distress. I think that's reasonable to continue. We'll plan to do another chest x-ray. Depending on results of that CT may be helpful because some of the infiltrates were seen may be related to pleural effusion rather than to atelectasis. Depending on his results to conservative treatment he may need bronchoscopy. Principal Problem:   Sepsis due to methicillin resistant Staphylococcus aureus (MRSA) (HCC) Active Problems:   Essential hypertension, benign   Palliative care encounter   Goals of care, counseling/discussion   DNR (do not resuscitate) discussion   Acute metabolic encephalopathy   MRSA bacteremia   Acute renal failure superimposed on stage 3 chronic kidney disease (HCC)   Dementia without behavioral disturbance   Endocarditis of native valve   Acute respiratory failure with hypoxia (HCC)   Aspiration pneumonia of both lower lobes due to gastric secretions (HCC)    Plan: As above    LOS: 5 days   Bruno Leach L 10/22/2016, 11:05 AM

## 2016-10-23 LAB — EXPECTORATED SPUTUM ASSESSMENT W REFEX TO RESP CULTURE

## 2016-10-23 LAB — EXPECTORATED SPUTUM ASSESSMENT W GRAM STAIN, RFLX TO RESP C: Special Requests: NORMAL

## 2016-10-23 MED ORDER — SODIUM CHLORIDE 0.9 % IV SOLN
INTRAVENOUS | Status: DC
  Administered 2016-10-23 (×2): via INTRAVENOUS

## 2016-10-23 MED ORDER — LEVALBUTEROL HCL 1.25 MG/0.5ML IN NEBU
1.2500 mg | INHALATION_SOLUTION | Freq: Three times a day (TID) | RESPIRATORY_TRACT | Status: DC
  Administered 2016-10-23 – 2016-10-26 (×10): 1.25 mg via RESPIRATORY_TRACT
  Filled 2016-10-23 (×11): qty 0.5

## 2016-10-23 MED ORDER — POLYMYXIN B-TRIMETHOPRIM 10000-0.1 UNIT/ML-% OP SOLN
1.0000 [drp] | OPHTHALMIC | Status: DC
  Administered 2016-10-23 – 2016-10-26 (×13): 1 [drp] via OPHTHALMIC
  Filled 2016-10-23: qty 10

## 2016-10-23 MED ORDER — POTASSIUM CHLORIDE CRYS ER 20 MEQ PO TBCR
40.0000 meq | EXTENDED_RELEASE_TABLET | Freq: Once | ORAL | Status: AC
  Administered 2016-10-23: 40 meq via ORAL
  Filled 2016-10-23: qty 2

## 2016-10-23 NOTE — Progress Notes (Signed)
Subjective: He says he feels well. He has no complaints. His breathing is doing okay. Chest x-ray yesterday that I personally reviewed shows worsening of the atelectasis of the left lung but he looks much better today. He is coughing up copious amounts of sputum.  Objective: Vital signs in last 24 hours: Temp:  [98.4 F (36.9 C)-99 F (37.2 C)] 99 F (37.2 C) (07/22 0523) Pulse Rate:  [76-83] 76 (07/22 0523) Resp:  [17-18] 18 (07/22 0523) BP: (130-154)/(67-74) 136/67 (07/22 0523) SpO2:  [87 %-96 %] 87 % (07/22 1008) Weight:  [63.3 kg (139 lb 8.8 oz)] 63.3 kg (139 lb 8.8 oz) (07/22 0523) Weight change:  Last BM Date: 10/19/16  Intake/Output from previous day: 07/21 0701 - 07/22 0700 In: 360 [P.O.:360] Out: 750 [Urine:750]  PHYSICAL EXAM General appearance: alert, cooperative and no distress Resp: rhonchi bilaterally Cardio: irregularly irregular rhythm GI: soft, non-tender; bowel sounds normal; no masses,  no organomegaly Extremities: extremities normal, atraumatic, no cyanosis or edema Skin warm and dry  Lab Results:  Results for orders placed or performed during the hospital encounter of 10/16/16 (from the past 48 hour(s))  Basic metabolic panel     Status: Abnormal   Collection Time: 10/22/16  1:31 PM  Result Value Ref Range   Sodium 144 135 - 145 mmol/Cross   Potassium 3.2 (Cross) 3.5 - 5.1 mmol/Cross   Chloride 104 101 - 111 mmol/Cross   CO2 31 22 - 32 mmol/Cross   Glucose, Bld 127 (H) 65 - 99 mg/dL   BUN 30 (H) 6 - 20 mg/dL   Creatinine, Ser 1.27 (H) 0.61 - 1.24 mg/dL   Calcium 8.4 (Cross) 8.9 - 10.3 mg/dL   GFR calc non Af Amer 51 (Cross) >60 mL/min   GFR calc Af Amer 59 (Cross) >60 mL/min    Comment: (NOTE) The eGFR has been calculated using the CKD EPI equation. This calculation has not been validated in all clinical situations. eGFR's persistently <60 mL/min signify possible Chronic Kidney Disease.    Anion gap 9 5 - 15  CBC     Status: Abnormal   Collection Time: 10/22/16  1:31 PM   Result Value Ref Range   WBC 10.4 4.0 - 10.5 K/uL   RBC 4.09 (Cross) 4.22 - 5.81 MIL/uL   Hemoglobin 13.1 13.0 - 17.0 g/dL   HCT 38.5 (Cross) 39.0 - 52.0 %   MCV 94.1 78.0 - 100.0 fL   MCH 32.0 26.0 - 34.0 pg   MCHC 34.0 30.0 - 36.0 g/dL   RDW 13.2 11.5 - 15.5 %   Platelets 121 (Cross) 150 - 400 K/uL  Magnesium     Status: None   Collection Time: 10/22/16  1:31 PM  Result Value Ref Range   Magnesium 1.9 1.7 - 2.4 mg/dL  Culture, sputum-assessment     Status: None   Collection Time: 10/22/16  4:00 PM  Result Value Ref Range   Specimen Description EXPECTORATED SPUTUM    Special Requests NONE    Sputum evaluation      Sputum specimen not acceptable for testing.  Please recollect.   RESULT CALLED TO, READ BACK BY AND VERIFIED WITH: GARZON,S. AT 1836 ON07/21/2018 BY EVA   Report Status 10/22/2016 FINAL     ABGS No results for input(s): PHART, PO2ART, TCO2, HCO3 in the last 72 hours.  Invalid input(s): PCO2 CULTURES Recent Results (from the past 240 hour(s))  Urine culture     Status: None   Collection Time: 10/16/16 11:13  PM  Result Value Ref Range Status   Specimen Description URINE, CLEAN CATCH  Final   Special Requests NONE  Final   Culture   Final    NO GROWTH Performed at [facility], [address], [place], [place] [address]    Report Status 10/18/2016 FINAL  Final  Blood Culture (routine x 2)     Status: Abnormal   Collection Time: 10/17/16 12:37 AM  Result Value Ref Range Status   Specimen Description BLOOD RIGHT ARM  Final   Special Requests   Final    BOTTLES DRAWN AEROBIC AND ANAEROBIC Blood Culture adequate volume   Culture  Setup Time   Final    GRAM POSITIVE COCCI Gram Stain Report Called to,Read Back By and Verified With: BIVENS,Cross @ 1243 ON 7.16.18 BY BOWMAN,Cross IN BOTH AEROBIC AND ANAEROBIC BOTTLES    Culture METHICILLIN RESISTANT STAPHYLOCOCCUS AUREUS (A)  Final   Report Status 10/19/2016 FINAL  Final   Organism ID, Bacteria METHICILLIN RESISTANT  STAPHYLOCOCCUS AUREUS  Final      Susceptibility   Methicillin resistant staphylococcus aureus - MIC*    CIPROFLOXACIN <=0.5 SENSITIVE Sensitive     ERYTHROMYCIN >=8 RESISTANT Resistant     GENTAMICIN <=0.5 SENSITIVE Sensitive     OXACILLIN >=4 RESISTANT Resistant     TETRACYCLINE <=1 SENSITIVE Sensitive     VANCOMYCIN 1 SENSITIVE Sensitive     TRIMETH/SULFA <=10 SENSITIVE Sensitive     CLINDAMYCIN <=0.25 SENSITIVE Sensitive     RIFAMPIN <=0.5 SENSITIVE Sensitive     Inducible Clindamycin NEGATIVE Sensitive     * METHICILLIN RESISTANT STAPHYLOCOCCUS AUREUS  Blood Culture ID Panel (Reflexed)     Status: Abnormal   Collection Time: 10/17/16 12:37 AM  Result Value Ref Range Status   Enterococcus species NOT DETECTED NOT DETECTED Final   Listeria monocytogenes NOT DETECTED NOT DETECTED Final   Staphylococcus species DETECTED (A) NOT DETECTED Final    Comment: CRITICAL RESULT CALLED TO, READ BACK BY AND VERIFIED WITH: Hector Brunswick RN, AT 1844 10/17/16 BY D. VANHOOK    Staphylococcus aureus DETECTED (A) NOT DETECTED Final    Comment: Methicillin (oxacillin)-resistant Staphylococcus aureus (MRSA). MRSA is predictably resistant to beta-lactam antibiotics (except ceftaroline). Preferred therapy is vancomycin unless clinically contraindicated. Patient requires contact precautions if  hospitalized. CRITICAL RESULT CALLED TO, READ BACK BY AND VERIFIED WITH: Cross. BIVENS RN, AT 1844 10/17/16 BY D. VANHOOK    Methicillin resistance DETECTED (A) NOT DETECTED Final    Comment: CRITICAL RESULT CALLED TO, READ BACK BY AND VERIFIED WITH: Cross. BIVENS RN, AT 1844 10/17/16 BY D. VANHOOK    Streptococcus species NOT DETECTED NOT DETECTED Final   Streptococcus agalactiae NOT DETECTED NOT DETECTED Final   Streptococcus pneumoniae NOT DETECTED NOT DETECTED Final   Streptococcus pyogenes NOT DETECTED NOT DETECTED Final   Acinetobacter baumannii NOT DETECTED NOT DETECTED Final   Enterobacteriaceae species NOT  DETECTED NOT DETECTED Final   Enterobacter cloacae complex NOT DETECTED NOT DETECTED Final   Escherichia coli NOT DETECTED NOT DETECTED Final   Klebsiella oxytoca NOT DETECTED NOT DETECTED Final   Klebsiella pneumoniae NOT DETECTED NOT DETECTED Final   Proteus species NOT DETECTED NOT DETECTED Final   Serratia marcescens NOT DETECTED NOT DETECTED Final   Haemophilus influenzae NOT DETECTED NOT DETECTED Final   Neisseria meningitidis NOT DETECTED NOT DETECTED Final   Pseudomonas aeruginosa NOT DETECTED NOT DETECTED Final   Candida albicans NOT DETECTED NOT DETECTED Final   Candida glabrata  NOT DETECTED NOT DETECTED Final   Candida krusei NOT DETECTED NOT DETECTED Final   Candida parapsilosis NOT DETECTED NOT DETECTED Final   Candida tropicalis NOT DETECTED NOT DETECTED Final    Comment: Performed at [facility], [place] [address] [place], [place] [address]  Blood Culture (routine x 2)     Status: Abnormal   Collection Time: 10/17/16 12:46 AM  Result Value Ref Range Status   Specimen Description BLOOD LEFT ARM  Final   Special Requests   Final    BOTTLES DRAWN AEROBIC ONLY Blood Culture results may not be optimal due to an inadequate volume of blood received in culture bottles   Culture  Setup Time   Final    GRAM POSITIVE COCCI Gram Stain Report Called to,Read Back By and Verified With: BIVENS,Cross @ 8469 ON 7.16.18 BY BOWMAN,Cross AEROBIC BOTTLE ONLY    Culture (A)  Final    STAPHYLOCOCCUS AUREUS SUSCEPTIBILITIES PERFORMED ON PREVIOUS CULTURE WITHIN THE LAST 5 DAYS. Performed at [facility], [place] [address], [place], [place] [address]    Report Status 10/19/2016 FINAL  Final  Gram stain     Status: None   Collection Time: 10/18/16 10:34 AM  Result Value Ref Range Status   Specimen Description CSF  Final   Special Requests NONE  Final   Gram Stain   Final    CYTOSPIN SMEAR WBC SEEN WBC PRESENT,BOTH PMN AND MONONUCLEAR NO ORGANISMS SEEN   Report Status 10/18/2016 FINAL   Final  CSF culture     Status: None   Collection Time: 10/18/16 10:34 AM  Result Value Ref Range Status   Specimen Description CSF  Final   Special Requests NONE  Final   Gram Stain   Final    CYTOSPIN SMEAR NO ORGANISMS SEEN WBC SEEN WBC PRESENT,BOTH PMN AND MONONUCLEAR    Culture   Final    NO GROWTH 3 DAYS Performed at [facility], [place] [address] [place], [place] [address]    Report Status 10/22/2016 FINAL  Final  Culture, blood (Routine X 2) w Reflex to ID Panel     Status: None (Preliminary result)   Collection Time: 10/19/16  6:41 AM  Result Value Ref Range Status   Specimen Description BLOOD RIGHT ARM  Final   Special Requests Blood Culture adequate volume  Final   Culture NO GROWTH 4 DAYS  Final   Report Status PENDING  Incomplete  Culture, blood (Routine X 2) w Reflex to ID Panel     Status: None (Preliminary result)   Collection Time: 10/19/16  6:52 AM  Result Value Ref Range Status   Specimen Description BLOOD RIGHT HAND  Final   Special Requests Blood Culture adequate volume  Final   Culture NO GROWTH 4 DAYS  Final   Report Status PENDING  Incomplete  Culture, sputum-assessment     Status: None   Collection Time: 10/22/16  4:00 PM  Result Value Ref Range Status   Specimen Description EXPECTORATED SPUTUM  Final   Special Requests NONE  Final   Sputum evaluation   Final    Sputum specimen not acceptable for testing.  Please recollect.   RESULT CALLED TO, READ BACK BY AND VERIFIED WITH: GARZON,S. AT 1836 ON07/21/2018 BY EVA   Report Status 10/22/2016 FINAL  Final   Studies/Results: Dg Chest Port 1 View  Result Date: 10/22/2016 CLINICAL DATA:  Atelectasis follow-up EXAM: PORTABLE CHEST 1 VIEW COMPARISON:  10/21/2016 FINDINGS: Progressive  collapse of the left lung which is now completely collapsed. Mild shift of the mediastinal structures to the left. Diffuse airspace disease in the right may represent pneumonia or edema and is unchanged. IMPRESSION: Complete  collapse left lung which has progressed Diffuse right lung edema/pneumonia unchanged Electronically Signed   By: Franchot Gallo M.D.   On: 10/22/2016 13:29   Dg Swallowing Func-speech Pathology  Result Date: 10/22/2016 Objective Swallowing Evaluation: Type of Study: MBS-Modified Barium Swallow Study Patient Details Name: Jon Cross MRN: [id number] Date of Birth: 1933-02-18 Today's Date: 10/22/2016 Time: SLP Start Time (ACUTE ONLY): 0800-SLP Stop Time (ACUTE ONLY): 0915 SLP Time Calculation (min) (ACUTE ONLY): 75 min Past Medical History: Past Medical History: Diagnosis Date . Alzheimer disease  . Atrial fibrillation ([place])  . Essential hypertension, benign  . Herpes zoster  . Staphylococcal skin infection  Past Surgical History: Past Surgical History: Procedure Laterality Date . INGUINAL HERNIA REPAIR   . TEE WITHOUT CARDIOVERSION N/A 10/20/2016  Procedure: TRANSESOPHAGEAL ECHOCARDIOGRAM (TEE) WITH PROPOFOL;  Surgeon: Herminio Commons, MD;  Location: [facility];  Service: Cardiovascular;  Laterality: N/A; . TIBIA FRACTURE SURGERY    Open reduction and internal fixation of left tibia fracture . TONSILLECTOMY   . UMBILICAL HERNIA REPAIR   HPI: Jon Schreiner Mooreis a 83 y.o.malewith history of dementia, chronic kidney disease stage II A. fib as per the charts was brought to the ER. Patient to follow very weak and mildly confused. As per the report patient has been working in his yard today and [place] when patient was found to be weak and confused. Patient has not complained of any chest pain or shortness of breath nausea vomiting or diarrhea. Has been having persistent cough.In the ER patient is being found to have productive cough is oriented to his name only. Chest x-ray and UA are unremarkable. Patient was tachycardic and febrile with temperatures around 103F. No signs of meningitis. Patient had blood cultures drawn and since patient was having productive cough and began antibiotics for  pneumonia were started. Troponin is found to be very elevated. EKG showssinus tachycardia.CT of the head was unremarkable. BSE ordered. Subjective: Pt alert and c/o right eye discomfort Assessment / Plan / Recommendation CHL IP CLINICAL IMPRESSIONS 10/22/2016 Clinical Impression Pt presents with mild oropharyngeal dysphagia and mild pharyngoesophageal dysphagia characterized by prolonged oral phase with lingual pumping and inefficient bolus prep with solids, decreased velopharyngeal closure with variable velopharyngeal regurgitation with thins, reduced tongue base retraction and pharyngeal constriction, reduced cricopharyngeal relaxation, and prominent cricopharyngeus resulting in residuals post swallow along posterior pharyngeal wall, pyriforms, and cricopharyngeus across all textures and consistencies (mild to moderate amount). Residuals were reduced with cued repeat/dry swallows (not completely eliminated). Pt also benefited from liquid wash (following puree/mech soft with sip of liquid) to reduce residuals. Pt does appear to have bony protrusion (osteophytes) near C4-5, but does not impinge greatly into pharyngeal space, but could impact pharyngeal pressure given current weakness. Pt had a single episode of trace laryngeal penetration with straw sips thin (when co-occurring velopharyngeal backflow noted) with trace amount of thins entering interarytenoid space, but was immediately expelled. Pt assessed with both thin and nectars via cup and straw sips and self presented when able. No aspiration observed, however pt with fairly consistent throat clear/soft cough after many of his swallows. Pt with prominent cricopharyngeus with reduced relaxation resulting in tail end of bolus remaining near CP and posterior pharyngeal wall. Recommend D1/puree with thin liquids, crush  meds as able in puree. Straws OK. Prognosis for advancing diet textures is good with improved endurance and strength. D/W Dr. Carles Collet, and pt medical  status tenuous at this time. Recommend po only when alert and upright. RN to help with oral care and brush tongue. F/U SLP at SNF. SLP Visit Diagnosis Dysphagia, oropharyngeal phase (R13.12) Attention and concentration deficit following -- Frontal lobe and executive function deficit following -- Impact on safety and function Mild aspiration risk;Moderate aspiration risk   CHL IP TREATMENT RECOMMENDATION 10/22/2016 Treatment Recommendations Therapy as outlined in treatment plan below   Prognosis 10/22/2016 Prognosis for Safe Diet Advancement Fair Barriers to Reach Goals (No Data) Barriers/Prognosis Comment -- CHL IP DIET RECOMMENDATION 10/22/2016 SLP Diet Recommendations Dysphagia 1 (Puree) solids;Thin liquid Liquid Administration via Straw;Cup Medication Administration Crushed with puree Compensations Slow rate;Multiple dry swallows after each bite/sip;Follow solids with liquid;Effortful swallow Postural Changes Remain semi-upright after after feeds/meals (Comment);Seated upright at 90 degrees   CHL IP OTHER RECOMMENDATIONS 10/22/2016 Recommended Consults -- Oral Care Recommendations Oral care BID;Staff/trained caregiver to provide oral care Other Recommendations Clarify dietary restrictions   CHL IP FOLLOW UP RECOMMENDATIONS 10/22/2016 Follow up Recommendations Skilled Nursing facility   [facility] IP FREQUENCY AND DURATION 10/22/2016 Speech Therapy Frequency (ACUTE ONLY) min 2x/week Treatment Duration 1 week      CHL IP ORAL PHASE 10/22/2016 Oral Phase Impaired Oral - Pudding Teaspoon -- Oral - Pudding Cup -- Oral - Honey Teaspoon -- Oral - Honey Cup -- Oral - Nectar Teaspoon -- Oral - Nectar Cup -- Oral - Nectar Straw -- Oral - Thin Teaspoon -- Oral - Thin Cup -- Oral - Thin Straw Decreased velopharyngeal closure;Nasal reflux Oral - Puree Weak lingual manipulation;Lingual pumping;Delayed oral transit Oral - Mech Soft -- Oral - Regular -- Oral - Multi-Consistency -- Oral - Pill -- Oral Phase - Comment --  CHL IP PHARYNGEAL  PHASE 10/22/2016 Pharyngeal Phase Impaired Pharyngeal- Pudding Teaspoon -- Pharyngeal -- Pharyngeal- Pudding Cup -- Pharyngeal -- Pharyngeal- Honey Teaspoon -- Pharyngeal -- Pharyngeal- Honey Cup -- Pharyngeal -- Pharyngeal- Nectar Teaspoon -- Pharyngeal -- Pharyngeal- Nectar Cup -- Pharyngeal -- Pharyngeal- Nectar Straw Pharyngeal residue - pyriform;Pharyngeal residue - posterior pharnyx;Pharyngeal residue - cp segment;Reduced tongue base retraction;Pharyngeal residue - valleculae Pharyngeal -- Pharyngeal- Thin Teaspoon -- Pharyngeal -- Pharyngeal- Thin Cup Reduced tongue base retraction;Pharyngeal residue - cp segment;Pharyngeal residue - pyriform Pharyngeal -- Pharyngeal- Thin Straw Reduced tongue base retraction;Penetration/Aspiration during swallow;Pharyngeal residue - pyriform;Pharyngeal residue - cp segment;Nasopharyngeal reflux Pharyngeal Material enters airway, remains ABOVE vocal cords then ejected out;Material does not enter airway Pharyngeal- Puree Delayed swallow initiation-vallecula;Reduced tongue base retraction;Pharyngeal residue - valleculae;Pharyngeal residue - posterior pharnyx;Pharyngeal residue - cp segment;Reduced pharyngeal peristalsis Pharyngeal -- Pharyngeal- Mechanical Soft Reduced pharyngeal peristalsis;Reduced tongue base retraction;Pharyngeal residue - valleculae;Pharyngeal residue - cp segment Pharyngeal -- Pharyngeal- Regular -- Pharyngeal -- Pharyngeal- Multi-consistency -- Pharyngeal -- Pharyngeal- Pill NT Pharyngeal -- Pharyngeal Comment reduced CP relaxation with resultant CP/PPW residue post swallow  CHL IP CERVICAL ESOPHAGEAL PHASE 10/22/2016 Cervical Esophageal Phase Impaired Pudding Teaspoon -- Pudding Cup -- Honey Teaspoon -- Honey Cup -- Nectar Teaspoon -- Nectar Cup -- Nectar Straw Reduced cricopharyngeal relaxation;Prominent cricopharyngeal segment Thin Teaspoon -- Thin Cup -- Thin Straw Reduced cricopharyngeal relaxation;Prominent cricopharyngeal segment Puree Reduced  cricopharyngeal relaxation;Prominent cricopharyngeal segment Mechanical Soft -- Regular -- Multi-consistency -- Pill -- Cervical Esophageal Comment tail of bolus remains near UES and PPW Thank you, Genene Churn, [place] PORTER,DABNEY 10/22/2016, 9:48 AM  Medications:  Prior to Admission:  Prescriptions Prior to Admission  Medication Sig Dispense Refill Last Dose  . memantine (NAMENDA) 5 MG tablet Take 5 mg by mouth daily.     Marland Kitchen acetaminophen (TYLENOL) 500 MG tablet Take 500 mg by mouth every 6 (six) hours as needed.     Marland Kitchen aspirin EC 81 MG tablet Take 81 mg by mouth daily.   Taking   Scheduled: . acetylcysteine  4 mL Nebulization TID  . aspirin EC  81 mg Oral Daily  . chlorhexidine  15 mL Mouth Rinse BID  . levalbuterol  1.25 mg Nebulization TID  . mouth rinse  15 mL Mouth Rinse q12n4p  . vitamin B-12  500 mcg Oral Daily   Continuous: . ampicillin-sulbactam (UNASYN) IV Stopped (10/23/16 0715)  . vancomycin 1,000 mg (10/23/16 1048)   NGI:[id number] **OR** acetaminophen  Assesment:He was admitted with sepsis from methicillin-resistant staph. 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 He has atelectasis of essential the entire left lung. However he is now coughing up copious amounts of sputum so that may be able to be cleared. He really is not very symptomatic from his respiratory issue except for the cough. Principal Problem:   Sepsis due to methicillin resistant Staphylococcus aureus (MRSA) ([place]) Active Problems:   Essential hypertension, benign   Palliative care encounter   Goals of care, counseling/discussion   DNR (do not resuscitate) discussion   Acute metabolic encephalopathy   MRSA bacteremia   Acute renal failure superimposed on stage 3 chronic kidney disease (HCC)   Dementia without behavioral disturbance   Endocarditis of native valve   Acute respiratory failure with hypoxia (HCC)   Aspiration pneumonia of both lower lobes due to gastric  secretions ([place])    Plan: CT chest in the morning. If it looks like he's starting to clear this will continue conservative treatment if not we'll plan for bronchoscopy    LOS: 6 days   Jon Cross 10/23/2016, 10:58 AM

## 2016-10-23 NOTE — Progress Notes (Signed)
PROGRESS NOTE  Jon Cross ZOX:[id number] DOB: April 11, 1932 DOA: 10/16/2016 PCP: Joette Catching, MD  Brief History: 83 year old male with a history of Alzheimer's dementia, CKD stage III, atrial fibrillation presented with fevers and confusion. Apparently, the patient was working outside in his yard. Later in the day, the patient was found to have altered mental status with generalized weakness. The patient was found to have temperature 100.44F with WBC 20.5 at the time of admission. Blood cultures are growing MRSA. The patient was started on vancomycin. Lumbar culture was performed on 10/18/2016 and showed WBC14 with 80% neutrophils. The patient was started on empiric acyclovir pending results of his HSV PCR. Acyclovir was discontinued a 1 stage is any PCR was negative. TEE revealed a mitral valve vegetation. Repeat blood cultures were obtained and remained negative. Unfortunately, the patient developed respiratory failure secondary to aspiration pneumonia.He was started on Unasyn. Repeat chest x-ray on 10/21/2016 showed atelectasis involving almost the entire left lung with diffuse infiltrates of the right lung. Pulmonary was consulted to assist  Assessment/Plan: Sepsis -Secondary to bacteremia -Lactic acid peaked 1.7 -Continue vancomycin -Fluid saline locked secondary to volume overload -leukocytosis improved -remains hemodynamically stable -sepsis physiology resolved  MRSA bacteremia -Source unclear -10/17/16 TTE--EF 50-55%, grade 1 DD, mild AI/TR, trivial pericardial effusion -Surveillance blood cultures--neg to date -Appreciatecardiology for TEE, discussed with Dr. Purvis Sheffield -place PICC line when surveillance blood cultures neg x 5 days--likely on 7/23  Native valve endocarditis -10/20/16 TEE--1.5 cm x 1.9 cm vegetation on posterior mitral leaflet, trivial MR -likely a poor candidate for any surgical intervention--case discussed with ID, Dr. Luciana Axe 7/19 -continue  vancomycin  Acute respiratory failure with hypoxia -Secondary to aspiration pneumonia and atelectasis of left lung -Continue Unasyn -Stable on 2 L nasal cannula  Aspiration pneumonia -continue unasyn  Atelectasis of left lung -appreciate pulmonary--?need for bronchoscopy -start mucomyst -10/22/16--personally reviewed CXR--complete collapse of L-lung, R-lung infiltrates  Acute metabolic encephalopathy -Secondary to infectious process -Patient has returned back to near baseline -10/18/2016 lumbar puncture cultures negative -HSV PCR negative--discontinue acyclovir  Elevated troponin -due to demand ischemia -no chest pain -personally reviewed EKG--sinus with T wave inversion V4-V6 without change  Acute on chronic renal failure--CKD stage III -Secondary to sepsis and volume depletion -Baseline creatinine 1.2-1.4 -Serum creatinine peaked at 1.78  Dementia without behavioral disturbance -According to the patient's primary care note on 08/17/2016, he wasonNamenda and Aricept -Unclear if the patient was taking is properly -Restart Namenda 5 mg daily and Aricept 5 mg daily when more alert  B12 deficiency -Restart supplementation  Thrombocytopenia -due to sepsis -check fibrinogen--569 -stable-->trending back up -am CBC  Hypokalemia -repleted -check mag--1.7  Goals of Care -palliative medicine following -full scope of treatment   Disposition Plan: SNF in 2-3 days  Family Communication: Son updated on phone 7/22   Consultants: cardiology, pulmonary  Code Status: FULL   DVT Prophylaxis: SCDs   Procedures: As Listed in Progress Note Above  Antibiotics: Azithromycin 7/16 Ceftriaxone 7/16-7/17 Acyclovir 7/17>>>10/21/16 Vancomycin 7/16>>> Unasyn 7/20>>>     Subjective: Patient denies fevers, chills, headache, chest pain, dyspnea, nausea, vomiting, diarrhea, abdominal pain, dysuria, hematuria, hematochezia, and  melena.   Objective: Vitals:   10/22/16 2101 10/22/16 2108 10/23/16 0523 10/23/16 1008  BP:  (!) 154/74 136/67   Pulse:  83 76   Resp:  17 18   Temp:  98.6 F (37 C) 99 F (37.2 C)   TempSrc:  Oral Oral  SpO2: 91%  96% (!) 87%  Weight:   63.3 kg (139 lb 8.8 oz)   Height:        Intake/Output Summary (Last 24 hours) at 10/23/16 1236 Last data filed at 10/23/16 0900  Gross per 24 hour  Intake              360 ml  Output              750 ml  Net             -390 ml   Weight change:  Exam:   General:  Pt is alert, follows commands appropriately, not in acute distress  HEENT: No icterus, No thrush, No neck mass, [place]/AT  Cardiovascular: RRR, S1/S2, no rubs, no gallops  Respiratory: Diminished breath sounds left lung. Bilateral crackles. No wheezing.  Abdomen: Soft/+BS, non tender, non distended, no guarding  Extremities: No edema, No lymphangitis, No petechiae, No rashes, no synovitis   Data Reviewed: I have personally reviewed following labs and imaging studies Basic Metabolic Panel:  Recent Labs Lab 10/18/16 0406 10/19/16 0641 10/20/16 0617 10/21/16 0723 10/22/16 1331  NA 142 136 138 138 144  K 4.0 3.4* 3.4* 3.6 3.2*  CL 107 104 101 101 104  CO2 25 24 26 28 31   GLUCOSE 75 104* 106* 113* 127*  BUN 29* 25* 28* 29* 30*  CREATININE 1.78* 1.28* 1.39* 1.23 1.27*  CALCIUM 8.0* 8.0* 8.2* 8.2* 8.4*  MG  --   --  1.7 2.0 1.9   Liver Function Tests:  Recent Labs Lab 10/16/16 2212 10/17/16 0455 10/18/16 0406  AST 43* 44* 49*  ALT 34 35 32  ALKPHOS 70 61 47  BILITOT 1.4* 1.3* 0.7  PROT 6.2* 5.6* 4.8*  ALBUMIN 3.4* 3.1* 2.6*   No results for input(s): LIPASE, AMYLASE in the last 168 hours. No results for input(s): AMMONIA in the last 168 hours. Coagulation Profile: No results for input(s): INR, PROTIME in the last 168 hours. CBC:  Recent Labs Lab 10/16/16 2212 10/17/16 0455 10/18/16 0406 10/19/16 0641 10/20/16 0617 10/21/16 0723 10/22/16 1331   WBC 20.5* 12.8* 12.1* 11.5* 9.7 10.2 10.4  NEUTROABS 18.1* 12.0*  --   --   --   --   --   HGB 13.8 13.3 11.9* 12.9* 13.1 13.2 13.1  HCT 42.2 39.9 35.8* 38.0* 38.4* 39.3 38.5*  MCV 97.0 96.4 97.0 94.3 93.7 94.7 94.1  PLT 125* 96* 71* 60* 64* 78* 121*   Cardiac Enzymes:  Recent Labs Lab 10/16/16 2212 10/17/16 0049 10/17/16 0455 10/17/16 1004 10/17/16 1606  CKTOTAL 38*  --  64  --   --   TROPONINI 0.06* 0.07* 0.14* 0.21* 0.55*   BNP: Invalid input(s): POCBNP CBG: No results for input(s): GLUCAP in the last 168 hours. HbA1C: No results for input(s): HGBA1C in the last 72 hours. Urine analysis:    Component Value Date/Time   COLORURINE YELLOW 10/16/2016 2313   APPEARANCEUR CLEAR 10/16/2016 2313   LABSPEC 1.018 10/16/2016 2313   PHURINE 7.0 10/16/2016 2313   GLUCOSEU NEGATIVE 10/16/2016 2313   HGBUR NEGATIVE 10/16/2016 2313   BILIRUBINUR NEGATIVE 10/16/2016 2313   KETONESUR NEGATIVE 10/16/2016 2313   PROTEINUR 30 (A) 10/16/2016 2313   NITRITE NEGATIVE 10/16/2016 2313   LEUKOCYTESUR NEGATIVE 10/16/2016 2313   Sepsis Labs: @LABRCNTIP (procalcitonin:4,lacticidven:4) ) Recent Results (from the past 240 hour(s))  Urine culture     Status: None   Collection Time: 10/16/16 11:13 PM  Result Value  Ref Range Status   Specimen Description URINE, CLEAN CATCH  Final   Special Requests NONE  Final   Culture   Final    NO GROWTH Performed at [facility], [address], [place], [place] [address]    Report Status 10/18/2016 FINAL  Final  Blood Culture (routine x 2)     Status: Abnormal   Collection Time: 10/17/16 12:37 AM  Result Value Ref Range Status   Specimen Description BLOOD RIGHT ARM  Final   Special Requests   Final    BOTTLES DRAWN AEROBIC AND ANAEROBIC Blood Culture adequate volume   Culture  Setup Time   Final    GRAM POSITIVE COCCI Gram Stain Report Called to,Read Back By and Verified With: BIVENS,L @ 1243 ON 7.16.18 BY BOWMAN,L IN BOTH AEROBIC AND  ANAEROBIC BOTTLES    Culture METHICILLIN RESISTANT STAPHYLOCOCCUS AUREUS (A)  Final   Report Status 10/19/2016 FINAL  Final   Organism ID, Bacteria METHICILLIN RESISTANT STAPHYLOCOCCUS AUREUS  Final      Susceptibility   Methicillin resistant staphylococcus aureus - MIC*    CIPROFLOXACIN <=0.5 SENSITIVE Sensitive     ERYTHROMYCIN >=8 RESISTANT Resistant     GENTAMICIN <=0.5 SENSITIVE Sensitive     OXACILLIN >=4 RESISTANT Resistant     TETRACYCLINE <=1 SENSITIVE Sensitive     VANCOMYCIN 1 SENSITIVE Sensitive     TRIMETH/SULFA <=10 SENSITIVE Sensitive     CLINDAMYCIN <=0.25 SENSITIVE Sensitive     RIFAMPIN <=0.5 SENSITIVE Sensitive     Inducible Clindamycin NEGATIVE Sensitive     * METHICILLIN RESISTANT STAPHYLOCOCCUS AUREUS  Blood Culture ID Panel (Reflexed)     Status: Abnormal   Collection Time: 10/17/16 12:37 AM  Result Value Ref Range Status   Enterococcus species NOT DETECTED NOT DETECTED Final   Listeria monocytogenes NOT DETECTED NOT DETECTED Final   Staphylococcus species DETECTED (A) NOT DETECTED Final    Comment: CRITICAL RESULT CALLED TO, READ BACK BY AND VERIFIED WITH: Hector Brunswick RN, AT 1844 10/17/16 BY D. VANHOOK    Staphylococcus aureus DETECTED (A) NOT DETECTED Final    Comment: Methicillin (oxacillin)-resistant Staphylococcus aureus (MRSA). MRSA is predictably resistant to beta-lactam antibiotics (except ceftaroline). Preferred therapy is vancomycin unless clinically contraindicated. Patient requires contact precautions if  hospitalized. CRITICAL RESULT CALLED TO, READ BACK BY AND VERIFIED WITH: L. BIVENS RN, AT 1844 10/17/16 BY D. VANHOOK    Methicillin resistance DETECTED (A) NOT DETECTED Final    Comment: CRITICAL RESULT CALLED TO, READ BACK BY AND VERIFIED WITH: L. BIVENS RN, AT 1844 10/17/16 BY D. VANHOOK    Streptococcus species NOT DETECTED NOT DETECTED Final   Streptococcus agalactiae NOT DETECTED NOT DETECTED Final   Streptococcus pneumoniae NOT DETECTED NOT  DETECTED Final   Streptococcus pyogenes NOT DETECTED NOT DETECTED Final   Acinetobacter baumannii NOT DETECTED NOT DETECTED Final   Enterobacteriaceae species NOT DETECTED NOT DETECTED Final   Enterobacter cloacae complex NOT DETECTED NOT DETECTED Final   Escherichia coli NOT DETECTED NOT DETECTED Final   Klebsiella oxytoca NOT DETECTED NOT DETECTED Final   Klebsiella pneumoniae NOT DETECTED NOT DETECTED Final   Proteus species NOT DETECTED NOT DETECTED Final   Serratia marcescens NOT DETECTED NOT DETECTED Final   Haemophilus influenzae NOT DETECTED NOT DETECTED Final   Neisseria meningitidis NOT DETECTED NOT DETECTED Final   Pseudomonas aeruginosa NOT DETECTED NOT DETECTED Final   Candida albicans NOT DETECTED NOT DETECTED Final   Candida glabrata NOT DETECTED NOT DETECTED  Final   Candida krusei NOT DETECTED NOT DETECTED Final   Candida parapsilosis NOT DETECTED NOT DETECTED Final   Candida tropicalis NOT DETECTED NOT DETECTED Final    Comment: Performed at [facility], [address] [place], [place] [address]  Blood Culture (routine x 2)     Status: Abnormal   Collection Time: 10/17/16 12:46 AM  Result Value Ref Range Status   Specimen Description BLOOD LEFT ARM  Final   Special Requests   Final    BOTTLES DRAWN AEROBIC ONLY Blood Culture results may not be optimal due to an inadequate volume of blood received in culture bottles   Culture  Setup Time   Final    GRAM POSITIVE COCCI Gram Stain Report Called to,Read Back By and Verified With: BIVENS,L @ 1243 ON 7.16.18 BY BOWMAN,L AEROBIC BOTTLE ONLY    Culture (A)  Final    STAPHYLOCOCCUS AUREUS SUSCEPTIBILITIES PERFORMED ON PREVIOUS CULTURE WITHIN THE LAST 5 DAYS. Performed at [facility], [address] [place], [place] [address]    Report Status 10/19/2016 FINAL  Final  Gram stain     Status: None   Collection Time: 10/18/16 10:34 AM  Result Value Ref Range Status   Specimen Description CSF  Final   Special  Requests NONE  Final   Gram Stain   Final    CYTOSPIN SMEAR WBC SEEN WBC PRESENT,BOTH PMN AND MONONUCLEAR NO ORGANISMS SEEN   Report Status 10/18/2016 FINAL  Final  CSF culture     Status: None   Collection Time: 10/18/16 10:34 AM  Result Value Ref Range Status   Specimen Description CSF  Final   Special Requests NONE  Final   Gram Stain   Final    CYTOSPIN SMEAR NO ORGANISMS SEEN WBC SEEN WBC PRESENT,BOTH PMN AND MONONUCLEAR    Culture   Final    NO GROWTH 3 DAYS Performed at [facility], [address] [place], [place] [address]    Report Status 10/22/2016 FINAL  Final  Culture, blood (Routine X 2) w Reflex to ID Panel     Status: None (Preliminary result)   Collection Time: 10/19/16  6:41 AM  Result Value Ref Range Status   Specimen Description BLOOD RIGHT ARM  Final   Special Requests Blood Culture adequate volume  Final   Culture NO GROWTH 4 DAYS  Final   Report Status PENDING  Incomplete  Culture, blood (Routine X 2) w Reflex to ID Panel     Status: None (Preliminary result)   Collection Time: 10/19/16  6:52 AM  Result Value Ref Range Status   Specimen Description BLOOD RIGHT HAND  Final   Special Requests Blood Culture adequate volume  Final   Culture NO GROWTH 4 DAYS  Final   Report Status PENDING  Incomplete  Culture, sputum-assessment     Status: None   Collection Time: 10/22/16  4:00 PM  Result Value Ref Range Status   Specimen Description EXPECTORATED SPUTUM  Final   Special Requests NONE  Final   Sputum evaluation   Final    Sputum specimen not acceptable for testing.  Please recollect.   RESULT CALLED TO, READ BACK BY AND VERIFIED WITH: GARZON,S. AT 1836 ON07/21/2018 BY EVA   Report Status 10/22/2016 FINAL  Final  Culture, expectorated sputum-assessment     Status: None   Collection Time: 10/23/16  8:45 AM  Result Value Ref Range Status   Specimen Description SPUTUM  Final  Special Requests Normal  Final   Sputum evaluation THIS SPECIMEN IS  ACCEPTABLE FOR SPUTUM CULTURE  Final   Report Status 10/23/2016 FINAL  Final     Scheduled Meds: . acetylcysteine  4 mL Nebulization TID  . aspirin EC  81 mg Oral Daily  . chlorhexidine  15 mL Mouth Rinse BID  . levalbuterol  1.25 mg Nebulization TID  . mouth rinse  15 mL Mouth Rinse q12n4p  . potassium chloride  40 mEq Oral Once  . vitamin B-12  500 mcg Oral Daily   Continuous Infusions: . ampicillin-sulbactam (UNASYN) IV Stopped (10/23/16 0715)  . vancomycin Stopped (10/23/16 1148)    Procedures/Studies: Dg Chest 2 View  Result Date: 10/16/2016 CLINICAL DATA:  Altered mental status and weakness after extended time outside. EXAM: CHEST  2 VIEW COMPARISON:  Chest radiograph June 24, 2014 FINDINGS: Cardiac silhouette is moderately enlarged. Calcified aortic knob. Similar mild chronic interstitial changes without pleural effusion or focal consolidation. Increased lung volumes. No pneumothorax. Accentuated kyphosis, chronic approximate T10 compression fracture. Osteopenia. IMPRESSION: Stable cardiomegaly and COPD. Electronically Signed   By: Awilda Metro M.D.   On: 10/16/2016 23:34   Ct Head Wo Contrast  Result Date: 10/16/2016 CLINICAL DATA:  83 year old male with altered mental status and weakness today. EXAM: CT HEAD WITHOUT CONTRAST TECHNIQUE: Contiguous axial images were obtained from the base of the skull through the vertex without intravenous contrast. COMPARISON:  None. FINDINGS: Brain: Mild cerebral atrophy. Patchy and confluent areas of decreased attenuation are noted throughout the deep and periventricular white matter of the cerebral hemispheres bilaterally, compatible with chronic microvascular ischemic disease. No evidence of acute infarction, hemorrhage, hydrocephalus, extra-axial collection or mass lesion/mass effect. Vascular: No hyperdense vessel or unexpected calcification. Skull: Normal. Negative for fracture or focal lesion. Sinuses/Orbits: Mild multifocal mucosal  thickening in the ethmoid sinuses. No acute finding. Other: None. IMPRESSION: 1. No acute intracranial abnormalities. 2. Mild cerebral atrophy with mild chronic microvascular ischemic changes in the cerebral white matter. Electronically Signed   By: Trudie Reed M.D.   On: 10/16/2016 23:24   Dg Chest Port 1 View  Result Date: 10/22/2016 CLINICAL DATA:  Atelectasis follow-up EXAM: PORTABLE CHEST 1 VIEW COMPARISON:  10/21/2016 FINDINGS: Progressive collapse of the left lung which is now completely collapsed. Mild shift of the mediastinal structures to the left. Diffuse airspace disease in the right may represent pneumonia or edema and is unchanged. IMPRESSION: Complete collapse left lung which has progressed Diffuse right lung edema/pneumonia unchanged Electronically Signed   By: Marlan Palau M.D.   On: 10/22/2016 13:29   Dg Chest Port 1 View  Result Date: 10/21/2016 CLINICAL DATA:  Hypoxia. EXAM: PORTABLE CHEST 1 VIEW COMPARISON:  10/17/2016 . FINDINGS: Cardiomegaly is again noted. Near complete opacification of the left lung with volume loss noted. This is consistent with atelectasis. Diffuse right lung infiltrate and/or edema. Left-sided pleural effusion cannot be excluded. No pneumothorax . IMPRESSION: 1. Near complete opacification left lung with volume loss. This consistent with left lung atelectasis. Underlying infiltrate cannot be excluded. Left-sided pleural effusion cannot be excluded. 2.  Diffuse infiltrate/edema right lung. 3. Persistent cardiomegaly. Electronically Signed   By: Maisie Fus  Register   On: 10/21/2016 07:44   Dg Chest Port 1 View  Result Date: 10/17/2016 CLINICAL DATA:  83 year old male with fever.  Subsequent encounter. EXAM: PORTABLE CHEST 1 VIEW COMPARISON:  10/16/2016. FINDINGS: Cardiomegaly. Pulmonary vascular congestion. No segmental consolidation. Slightly limited evaluation of retrocardiac region. Calcified slightly tortuous aorta. IMPRESSION:  Cardiomegaly. Pulmonary  vascular congestion. No segmental consolidation. Slightly limited evaluation of retrocardiac region. Electronically Signed   By: Lacy Duverney M.D.   On: 10/17/2016 14:42   Dg Swallowing Func-speech Pathology  Result Date: 10/22/2016 Objective Swallowing Evaluation: Type of Study: MBS-Modified Barium Swallow Study Patient Details Name: Jon Cross MRN: [id number] Date of Birth: 1932-05-11 Today's Date: 10/22/2016 Time: SLP Start Time (ACUTE ONLY): 0800-SLP Stop Time (ACUTE ONLY): 0915 SLP Time Calculation (min) (ACUTE ONLY): 75 min Past Medical History: Past Medical History: Diagnosis Date . Alzheimer disease  . Atrial fibrillation (HCC)  . Essential hypertension, benign  . Herpes zoster  . Staphylococcal skin infection  Past Surgical History: Past Surgical History: Procedure Laterality Date . INGUINAL HERNIA REPAIR   . TEE WITHOUT CARDIOVERSION N/A 10/20/2016  Procedure: TRANSESOPHAGEAL ECHOCARDIOGRAM (TEE) WITH PROPOFOL;  Surgeon: Laqueta Linden, MD;  Location: [facility];  Service: Cardiovascular;  Laterality: N/A; . TIBIA FRACTURE SURGERY    Open reduction and internal fixation of left tibia fracture . TONSILLECTOMY   . UMBILICAL HERNIA REPAIR   HPI: Jaicob Dia Mooreis a 83 y.o.malewith history of dementia, chronic kidney disease stage II A. fib as per the charts was brought to the ER. Patient to follow very weak and mildly confused. As per the report patient has been working in his yard today and [place] when patient was found to be weak and confused. Patient has not complained of any chest pain or shortness of breath nausea vomiting or diarrhea. Has been having persistent cough.In the ER patient is being found to have productive cough is oriented to his name only. Chest x-ray and UA are unremarkable. Patient was tachycardic and febrile with temperatures around 103F. No signs of meningitis. Patient had blood cultures drawn and since patient was having productive cough and began  antibiotics for pneumonia were started. Troponin is found to be very elevated. EKG showssinus tachycardia.CT of the head was unremarkable. BSE ordered. Subjective: Pt alert and c/o right eye discomfort Assessment / Plan / Recommendation CHL IP CLINICAL IMPRESSIONS 10/22/2016 Clinical Impression Pt presents with mild oropharyngeal dysphagia and mild pharyngoesophageal dysphagia characterized by prolonged oral phase with lingual pumping and inefficient bolus prep with solids, decreased velopharyngeal closure with variable velopharyngeal regurgitation with thins, reduced tongue base retraction and pharyngeal constriction, reduced cricopharyngeal relaxation, and prominent cricopharyngeus resulting in residuals post swallow along posterior pharyngeal wall, pyriforms, and cricopharyngeus across all textures and consistencies (mild to moderate amount). Residuals were reduced with cued repeat/dry swallows (not completely eliminated). Pt also benefited from liquid wash (following puree/mech soft with sip of liquid) to reduce residuals. Pt does appear to have bony protrusion (osteophytes) near C4-5, but does not impinge greatly into pharyngeal space, but could impact pharyngeal pressure given current weakness. Pt had a single episode of trace laryngeal penetration with straw sips thin (when co-occurring velopharyngeal backflow noted) with trace amount of thins entering interarytenoid space, but was immediately expelled. Pt assessed with both thin and nectars via cup and straw sips and self presented when able. No aspiration observed, however pt with fairly consistent throat clear/soft cough after many of his swallows. Pt with prominent cricopharyngeus with reduced relaxation resulting in tail end of bolus remaining near CP and posterior pharyngeal wall. Recommend D1/puree with thin liquids, crush meds as able in puree. Straws OK. Prognosis for advancing diet textures is good with improved endurance and strength. D/W Dr. Arbutus Leas,  and pt medical status tenuous at this time. Recommend po  only when alert and upright. RN to help with oral care and brush tongue. F/U SLP at SNF. SLP Visit Diagnosis Dysphagia, oropharyngeal phase (R13.12) Attention and concentration deficit following -- Frontal lobe and executive function deficit following -- Impact on safety and function Mild aspiration risk;Moderate aspiration risk   CHL IP TREATMENT RECOMMENDATION 10/22/2016 Treatment Recommendations Therapy as outlined in treatment plan below   Prognosis 10/22/2016 Prognosis for Safe Diet Advancement Fair Barriers to Reach Goals (No Data) Barriers/Prognosis Comment -- CHL IP DIET RECOMMENDATION 10/22/2016 SLP Diet Recommendations Dysphagia 1 (Puree) solids;Thin liquid Liquid Administration via Straw;Cup Medication Administration Crushed with puree Compensations Slow rate;Multiple dry swallows after each bite/sip;Follow solids with liquid;Effortful swallow Postural Changes Remain semi-upright after after feeds/meals (Comment);Seated upright at 90 degrees   CHL IP OTHER RECOMMENDATIONS 10/22/2016 Recommended Consults -- Oral Care Recommendations Oral care BID;Staff/trained caregiver to provide oral care Other Recommendations Clarify dietary restrictions   CHL IP FOLLOW UP RECOMMENDATIONS 10/22/2016 Follow up Recommendations Skilled Nursing facility   [facility] IP FREQUENCY AND DURATION 10/22/2016 Speech Therapy Frequency (ACUTE ONLY) min 2x/week Treatment Duration 1 week      CHL IP ORAL PHASE 10/22/2016 Oral Phase Impaired Oral - Pudding Teaspoon -- Oral - Pudding Cup -- Oral - Honey Teaspoon -- Oral - Honey Cup -- Oral - Nectar Teaspoon -- Oral - Nectar Cup -- Oral - Nectar Straw -- Oral - Thin Teaspoon -- Oral - Thin Cup -- Oral - Thin Straw Decreased velopharyngeal closure;Nasal reflux Oral - Puree Weak lingual manipulation;Lingual pumping;Delayed oral transit Oral - Mech Soft -- Oral - Regular -- Oral - Multi-Consistency -- Oral - Pill -- Oral Phase - Comment --  CHL IP  PHARYNGEAL PHASE 10/22/2016 Pharyngeal Phase Impaired Pharyngeal- Pudding Teaspoon -- Pharyngeal -- Pharyngeal- Pudding Cup -- Pharyngeal -- Pharyngeal- Honey Teaspoon -- Pharyngeal -- Pharyngeal- Honey Cup -- Pharyngeal -- Pharyngeal- Nectar Teaspoon -- Pharyngeal -- Pharyngeal- Nectar Cup -- Pharyngeal -- Pharyngeal- Nectar Straw Pharyngeal residue - pyriform;Pharyngeal residue - posterior pharnyx;Pharyngeal residue - cp segment;Reduced tongue base retraction;Pharyngeal residue - valleculae Pharyngeal -- Pharyngeal- Thin Teaspoon -- Pharyngeal -- Pharyngeal- Thin Cup Reduced tongue base retraction;Pharyngeal residue - cp segment;Pharyngeal residue - pyriform Pharyngeal -- Pharyngeal- Thin Straw Reduced tongue base retraction;Penetration/Aspiration during swallow;Pharyngeal residue - pyriform;Pharyngeal residue - cp segment;Nasopharyngeal reflux Pharyngeal Material enters airway, remains ABOVE vocal cords then ejected out;Material does not enter airway Pharyngeal- Puree Delayed swallow initiation-vallecula;Reduced tongue base retraction;Pharyngeal residue - valleculae;Pharyngeal residue - posterior pharnyx;Pharyngeal residue - cp segment;Reduced pharyngeal peristalsis Pharyngeal -- Pharyngeal- Mechanical Soft Reduced pharyngeal peristalsis;Reduced tongue base retraction;Pharyngeal residue - valleculae;Pharyngeal residue - cp segment Pharyngeal -- Pharyngeal- Regular -- Pharyngeal -- Pharyngeal- Multi-consistency -- Pharyngeal -- Pharyngeal- Pill NT Pharyngeal -- Pharyngeal Comment reduced CP relaxation with resultant CP/PPW residue post swallow  CHL IP CERVICAL ESOPHAGEAL PHASE 10/22/2016 Cervical Esophageal Phase Impaired Pudding Teaspoon -- Pudding Cup -- Honey Teaspoon -- Honey Cup -- Nectar Teaspoon -- Nectar Cup -- Nectar Straw Reduced cricopharyngeal relaxation;Prominent cricopharyngeal segment Thin Teaspoon -- Thin Cup -- Thin Straw Reduced cricopharyngeal relaxation;Prominent cricopharyngeal segment Puree  Reduced cricopharyngeal relaxation;Prominent cricopharyngeal segment Mechanical Soft -- Regular -- Multi-consistency -- Pill -- Cervical Esophageal Comment tail of bolus remains near UES and PPW Thank you, Havery Moros, CCC-SLP [phone] PORTER,DABNEY 10/22/2016, 9:48 AM              Dg Fluoro Guide Lumbar Puncture  Result Date: 10/18/2016 CLINICAL DATA:  Fever EXAM: DIAGNOSTIC LUMBAR PUNCTURE UNDER FLUOROSCOPIC GUIDANCE FLUOROSCOPY TIME:  Fluoroscopy Time:  0 minutes 30 seconds Radiation Exposure Index (if provided by the fluoroscopic device): 4.7 mGy Number of Acquired Spot Images: 2 screen captures during fluoroscopy PROCEDURE: Procedure, benefits, and risks were discussed with the patient, including alternatives. Patient's questions were answered. Written informed consent was obtained by the service from the patient's son. Timeout protocol followed. Patient placed prone. L4-L5 disc space was localized under fluoroscopy. Skin prepped and draped in usual sterile fashion. Skin and soft tissues anesthetized with 2 mL of 1% lidocaine. A 22 gauge needle was advanced into the spinal canal where clear colorless CSF was encountered. 10.5 mL of CSF was obtained in 4 tubes for requested analysis. Procedure tolerated very well by patient without immediate complication. IMPRESSION: Successful fluoroscopic guided lumbar puncture as above. Electronically Signed   By: Ulyses Southward M.D.   On: 10/18/2016 11:42    Ashira Kirsten, DO  [facility] Pager [phone]  If 7PM-7AM, please contact night-coverage [email or web address] Password [facility] 10/23/2016, 12:36 PM   LOS: 6 days

## 2016-10-24 ENCOUNTER — Inpatient Hospital Stay (HOSPITAL_COMMUNITY): Payer: Medicare HMO

## 2016-10-24 DIAGNOSIS — J690 Pneumonitis due to inhalation of food and vomit: Secondary | ICD-10-CM

## 2016-10-24 LAB — BASIC METABOLIC PANEL
Anion gap: 10 (ref 5–15)
BUN: 19 mg/dL (ref 6–20)
CALCIUM: 8.3 mg/dL — AB (ref 8.9–10.3)
CO2: 30 mmol/L (ref 22–32)
Chloride: 100 mmol/L — ABNORMAL LOW (ref 101–111)
Creatinine, Ser: 1.13 mg/dL (ref 0.61–1.24)
GFR calc Af Amer: >60 mL/min (ref >60)
GFR, EST NON AFRICAN AMERICAN: 59 mL/min — AB (ref >60)
Glucose, Bld: 100 mg/dL — ABNORMAL HIGH (ref 65–99)
POTASSIUM: 3.2 mmol/L — AB (ref 3.5–5.1)
Sodium: 140 mmol/L (ref 135–145)

## 2016-10-24 LAB — CULTURE, BLOOD (ROUTINE X 2)
CULTURE: NO GROWTH
Culture: NO GROWTH
SPECIAL REQUESTS: ADEQUATE
SPECIAL REQUESTS: ADEQUATE

## 2016-10-24 MED ORDER — IOPAMIDOL (ISOVUE-300) INJECTION 61%
75.0000 mL | Freq: Once | INTRAVENOUS | Status: AC | PRN
  Administered 2016-10-24: 75 mL via INTRAVENOUS

## 2016-10-24 MED ORDER — FUROSEMIDE 10 MG/ML IJ SOLN
40.0000 mg | Freq: Every day | INTRAMUSCULAR | Status: DC
  Administered 2016-10-24: 40 mg via INTRAVENOUS
  Filled 2016-10-24: qty 4

## 2016-10-24 MED ORDER — POTASSIUM CHLORIDE CRYS ER 20 MEQ PO TBCR: 40.0000 meq | EXTENDED_RELEASE_TABLET | Freq: Once | ORAL | Status: DC

## 2016-10-24 MED ORDER — POTASSIUM CHLORIDE CRYS ER 20 MEQ PO TBCR
40.0000 meq | EXTENDED_RELEASE_TABLET | Freq: Every day | ORAL | Status: DC
  Administered 2016-10-24 – 2016-10-26 (×3): 40 meq via ORAL
  Filled 2016-10-24 (×3): qty 2

## 2016-10-24 NOTE — Progress Notes (Signed)
Pt returned back from CT scan. IV fluids restarted.

## 2016-10-24 NOTE — Progress Notes (Signed)
Vascular access team called for insertion of PICC line.  ETA is [id number].

## 2016-10-24 NOTE — Progress Notes (Signed)
Pt transported down to CT via transport team.

## 2016-10-24 NOTE — Progress Notes (Signed)
  Speech Language Pathology Treatment: Dysphagia  Patient Details Name: Jon Cross MRN: [id number] DOB: 11-02-1932 Today's Date: 10/24/2016 Time: [id number] SLP Time Calculation (min) (ACUTE ONLY): 12 min  Assessment / Plan / Recommendation Clinical Impression  Dysphagia treatment provided to check diet tolerance. Pt tolerated puree consistency without overt s/s of aspiration but did show prolonged oral phase/ bolus formation. Pt did have a delayed, dry cough following sips of thin liquid; this was not always correlated with aspiration on the MBS for this pt. Treatment session was somewhat limited today as pt was not hungry. Recommend continuing dysphagia 1/ thin liquids, meds crushed in puree, full supervision to ensure pt upright and utilizing strategies recommended from MBS- alternate food/ liquid, multiple dry swallows, effortful swallows, 1 sip at a time. Will continue to follow for tolerance/ consider advancement as PNA improves.   HPI HPI: Jon SilenceGeorge W Mooreis a 83 y.o.malewith history of dementia, chronic kidney disease stage II A. fib as per the charts was brought to the ER. Patient to follow very weak and mildly confused. As per the report patient has been working in his yard today and [place] when patient was found to be weak and confused. Patient has not complained of any chest pain or shortness of breath nausea vomiting or diarrhea. Has been having persistent cough.In the ER patient is being found to have productive cough is oriented to his name only. Chest x-ray and UA are unremarkable. Patient was tachycardic and febrile with temperatures around 103F. No signs of meningitis. Patient had blood cultures drawn and since patient was having productive cough and began antibiotics for pneumonia were started. Troponin is found to be very elevated. EKG showssinus tachycardia.CT of the head was unremarkable. BSE ordered.      SLP Plan  Continue with current plan of  care       Recommendations  Diet recommendations: Dysphagia 1 (puree);Thin liquid Liquids provided via: Cup;Straw Medication Administration: Crushed with puree Supervision: Staff to assist with self feeding;Full supervision/cueing for compensatory strategies Compensations: Slow rate;Small sips/bites;Multiple dry swallows after each bite/sip;Follow solids with liquid Postural Changes and/or Swallow Maneuvers: Seated upright 90 degrees;Upright 30-60 min after meal                Oral Care Recommendations: Oral care BID Follow up Recommendations: Skilled Nursing facility SLP Visit Diagnosis: Dysphagia, oropharyngeal phase (R13.12);Dysphagia, pharyngoesophageal phase (R13.14) Plan: Continue with current plan of care       GO                [place] Kungmy K Hyden Soley, MA, CCC-SLP 10/24/2016, 1:44 PM [phone]

## 2016-10-24 NOTE — Progress Notes (Signed)
Subjective: He says he feels okay. He is still coughing up a lot of sputum. He has no new complaints except he says he feels tired and sore.  Objective: Vital signs in last 24 hours: Temp:  [97.5 F (36.4 C)-98.2 F (36.8 C)] 97.5 F (36.4 C) (07/23 7026) Pulse Rate:  [74-78] 78 (07/23 0614) Resp:  [16-18] 18 (07/23 0614) BP: (140-179)/(72-75) 179/75 (07/23 0614) SpO2:  [86 %-99 %] 86 % (07/23 0811) Weight:  [60.7 kg (133 lb 12.8 oz)] 60.7 kg (133 lb 12.8 oz) (07/23 3785) Weight change: -2.609 kg (-5 lb 12 oz) Last BM Date: 10/23/16  Intake/Output from previous day: 07/22 0701 - 07/23 0700 In: 1227.5 [P.O.:240; I.V.:87.5; IV Piggyback:900] Out: 1200 [Urine:1200]  PHYSICAL EXAM General appearance: alert, cooperative and no distress Resp: rhonchi bilaterally Cardio: irregularly irregular rhythm GI: soft, non-tender; bowel sounds normal; no masses,  no organomegaly Extremities: extremities normal, atraumatic, no cyanosis or edema Skin warm and dry  Lab Results:  Results for orders placed or performed during the hospital encounter of 10/16/16 (from the past 48 hour(s))  Basic metabolic panel     Status: Abnormal   Collection Time: 10/22/16  1:31 PM  Result Value Ref Range   Sodium 144 135 - 145 mmol/L   Potassium 3.2 (L) 3.5 - 5.1 mmol/L   Chloride 104 101 - 111 mmol/L   CO2 31 22 - 32 mmol/L   Glucose, Bld 127 (H) 65 - 99 mg/dL   BUN 30 (H) 6 - 20 mg/dL   Creatinine, Ser 1.27 (H) 0.61 - 1.24 mg/dL   Calcium 8.4 (L) 8.9 - 10.3 mg/dL   GFR calc non Af Amer 51 (L) >60 mL/min   GFR calc Af Amer 59 (L) >60 mL/min    Comment: (NOTE) The eGFR has been calculated using the CKD EPI equation. This calculation has not been validated in all clinical situations. eGFR's persistently <60 mL/min signify possible Chronic Kidney Disease.    Anion gap 9 5 - 15  CBC     Status: Abnormal   Collection Time: 10/22/16  1:31 PM  Result Value Ref Range   WBC 10.4 4.0 - 10.5 K/uL   RBC  4.09 (L) 4.22 - 5.81 MIL/uL   Hemoglobin 13.1 13.0 - 17.0 g/dL   HCT 38.5 (L) 39.0 - 52.0 %   MCV 94.1 78.0 - 100.0 fL   MCH 32.0 26.0 - 34.0 pg   MCHC 34.0 30.0 - 36.0 g/dL   RDW 13.2 11.5 - 15.5 %   Platelets 121 (L) 150 - 400 K/uL  Magnesium     Status: None   Collection Time: 10/22/16  1:31 PM  Result Value Ref Range   Magnesium 1.9 1.7 - 2.4 mg/dL  Culture, sputum-assessment     Status: None   Collection Time: 10/22/16  4:00 PM  Result Value Ref Range   Specimen Description EXPECTORATED SPUTUM    Special Requests NONE    Sputum evaluation      Sputum specimen not acceptable for testing.  Please recollect.   RESULT CALLED TO, READ BACK BY AND VERIFIED WITH: GARZON,S. AT 1836 ON07/21/2018 BY EVA   Report Status 10/22/2016 FINAL   Culture, expectorated sputum-assessment     Status: None   Collection Time: 10/23/16  8:45 AM  Result Value Ref Range   Specimen Description SPUTUM    Special Requests Normal    Sputum evaluation THIS SPECIMEN IS ACCEPTABLE FOR SPUTUM CULTURE    Report Status 10/23/2016 FINAL  Culture, respiratory (NON-Expectorated)     Status: None (Preliminary result)   Collection Time: 10/23/16  8:45 AM  Result Value Ref Range   Specimen Description SPUTUM    Special Requests Normal Reflexed from [id number]    Gram Stain      NO WBC SEEN RARE SQUAMOUS EPITHELIAL CELLS PRESENT NO ORGANISMS SEEN Performed at [facility], [address], [place], [place] [address]    Culture PENDING    Report Status PENDING     ABGS No results for input(s): PHART, PO2ART, TCO2, HCO3 in the last 72 hours.  Invalid input(s): PCO2 CULTURES Recent Results (from the past 240 hour(s))  Urine culture     Status: None   Collection Time: 10/16/16 11:13 PM  Result Value Ref Range Status   Specimen Description URINE, CLEAN CATCH  Final   Special Requests NONE  Final   Culture   Final    NO GROWTH Performed at [facility], [address], [place], [place] [address]     Report Status 10/18/2016 FINAL  Final  Blood Culture (routine x 2)     Status: Abnormal   Collection Time: 10/17/16 12:37 AM  Result Value Ref Range Status   Specimen Description BLOOD RIGHT ARM  Final   Special Requests   Final    BOTTLES DRAWN AEROBIC AND ANAEROBIC Blood Culture adequate volume   Culture  Setup Time   Final    GRAM POSITIVE COCCI Gram Stain Report Called to,Read Back By and Verified With: BIVENS,L @ 5573 ON 7.16.18 BY BOWMAN,L IN BOTH AEROBIC AND ANAEROBIC BOTTLES    Culture METHICILLIN RESISTANT STAPHYLOCOCCUS AUREUS (A)  Final   Report Status 10/19/2016 FINAL  Final   Organism ID, Bacteria METHICILLIN RESISTANT STAPHYLOCOCCUS AUREUS  Final      Susceptibility   Methicillin resistant staphylococcus aureus - MIC*    CIPROFLOXACIN <=0.5 SENSITIVE Sensitive     ERYTHROMYCIN >=8 RESISTANT Resistant     GENTAMICIN <=0.5 SENSITIVE Sensitive     OXACILLIN >=4 RESISTANT Resistant     TETRACYCLINE <=1 SENSITIVE Sensitive     VANCOMYCIN 1 SENSITIVE Sensitive     TRIMETH/SULFA <=10 SENSITIVE Sensitive     CLINDAMYCIN <=0.25 SENSITIVE Sensitive     RIFAMPIN <=0.5 SENSITIVE Sensitive     Inducible Clindamycin NEGATIVE Sensitive     * METHICILLIN RESISTANT STAPHYLOCOCCUS AUREUS  Blood Culture ID Panel (Reflexed)     Status: Abnormal   Collection Time: 10/17/16 12:37 AM  Result Value Ref Range Status   Enterococcus species NOT DETECTED NOT DETECTED Final   Listeria monocytogenes NOT DETECTED NOT DETECTED Final   Staphylococcus species DETECTED (A) NOT DETECTED Final    Comment: CRITICAL RESULT CALLED TO, READ BACK BY AND VERIFIED WITH: Lovena Neighbours RN, AT 1844 10/17/16 BY D. VANHOOK    Staphylococcus aureus DETECTED (A) NOT DETECTED Final    Comment: Methicillin (oxacillin)-resistant Staphylococcus aureus (MRSA). MRSA is predictably resistant to beta-lactam antibiotics (except ceftaroline). Preferred therapy is vancomycin unless clinically contraindicated. Patient requires  contact precautions if  hospitalized. CRITICAL RESULT CALLED TO, READ BACK BY AND VERIFIED WITH: L. BIVENS RN, AT 2202 10/17/16 BY D. VANHOOK    Methicillin resistance DETECTED (A) NOT DETECTED Final    Comment: CRITICAL RESULT CALLED TO, READ BACK BY AND VERIFIED WITH: Lovena Neighbours RN, AT 5427 10/17/16 BY D. VANHOOK    Streptococcus species NOT DETECTED NOT DETECTED Final   Streptococcus agalactiae NOT DETECTED NOT DETECTED Final   Streptococcus  pneumoniae NOT DETECTED NOT DETECTED Final   Streptococcus pyogenes NOT DETECTED NOT DETECTED Final   Acinetobacter baumannii NOT DETECTED NOT DETECTED Final   Enterobacteriaceae species NOT DETECTED NOT DETECTED Final   Enterobacter cloacae complex NOT DETECTED NOT DETECTED Final   Escherichia coli NOT DETECTED NOT DETECTED Final   Klebsiella oxytoca NOT DETECTED NOT DETECTED Final   Klebsiella pneumoniae NOT DETECTED NOT DETECTED Final   Proteus species NOT DETECTED NOT DETECTED Final   Serratia marcescens NOT DETECTED NOT DETECTED Final   Haemophilus influenzae NOT DETECTED NOT DETECTED Final   Neisseria meningitidis NOT DETECTED NOT DETECTED Final   Pseudomonas aeruginosa NOT DETECTED NOT DETECTED Final   Candida albicans NOT DETECTED NOT DETECTED Final   Candida glabrata NOT DETECTED NOT DETECTED Final   Candida krusei NOT DETECTED NOT DETECTED Final   Candida parapsilosis NOT DETECTED NOT DETECTED Final   Candida tropicalis NOT DETECTED NOT DETECTED Final    Comment: Performed at [facility], [place] [address] [place], [place] [address]  Blood Culture (routine x 2)     Status: Abnormal   Collection Time: 10/17/16 12:46 AM  Result Value Ref Range Status   Specimen Description BLOOD LEFT ARM  Final   Special Requests   Final    BOTTLES DRAWN AEROBIC ONLY Blood Culture results may not be optimal due to an inadequate volume of blood received in culture bottles   Culture  Setup Time   Final    GRAM POSITIVE COCCI Gram Stain Report  Called to,Read Back By and Verified With: BIVENS,L @ 3299 ON 7.16.18 BY BOWMAN,L AEROBIC BOTTLE ONLY    Culture (A)  Final    STAPHYLOCOCCUS AUREUS SUSCEPTIBILITIES PERFORMED ON PREVIOUS CULTURE WITHIN THE LAST 5 DAYS. Performed at [facility], [place] [address], [place], [place] [address]    Report Status 10/19/2016 FINAL  Final  Gram stain     Status: None   Collection Time: 10/18/16 10:34 AM  Result Value Ref Range Status   Specimen Description CSF  Final   Special Requests NONE  Final   Gram Stain   Final    CYTOSPIN SMEAR WBC SEEN WBC PRESENT,BOTH PMN AND MONONUCLEAR NO ORGANISMS SEEN   Report Status 10/18/2016 FINAL  Final  CSF culture     Status: None   Collection Time: 10/18/16 10:34 AM  Result Value Ref Range Status   Specimen Description CSF  Final   Special Requests NONE  Final   Gram Stain   Final    CYTOSPIN SMEAR NO ORGANISMS SEEN WBC SEEN WBC PRESENT,BOTH PMN AND MONONUCLEAR    Culture   Final    NO GROWTH 3 DAYS Performed at [facility], [place] [address], [place], [place] [address]    Report Status 10/22/2016 FINAL  Final  Culture, blood (Routine X 2) w Reflex to ID Panel     Status: None (Preliminary result)   Collection Time: 10/19/16  6:41 AM  Result Value Ref Range Status   Specimen Description BLOOD RIGHT ARM  Final   Special Requests Blood Culture adequate volume  Final   Culture NO GROWTH 4 DAYS  Final   Report Status PENDING  Incomplete  Culture, blood (Routine X 2) w Reflex to ID Panel     Status: None (Preliminary result)   Collection Time: 10/19/16  6:52 AM  Result Value Ref Range Status   Specimen Description BLOOD RIGHT HAND  Final   Special Requests Blood Culture adequate  volume  Final   Culture NO GROWTH 4 DAYS  Final   Report Status PENDING  Incomplete  Culture, sputum-assessment     Status: None   Collection Time: 10/22/16  4:00 PM  Result Value Ref Range Status   Specimen Description EXPECTORATED SPUTUM  Final   Special  Requests NONE  Final   Sputum evaluation   Final    Sputum specimen not acceptable for testing.  Please recollect.   RESULT CALLED TO, READ BACK BY AND VERIFIED WITH: GARZON,S. AT 1836 ON07/21/2018 BY EVA   Report Status 10/22/2016 FINAL  Final  Culture, expectorated sputum-assessment     Status: None   Collection Time: 10/23/16  8:45 AM  Result Value Ref Range Status   Specimen Description SPUTUM  Final   Special Requests Normal  Final   Sputum evaluation THIS SPECIMEN IS ACCEPTABLE FOR SPUTUM CULTURE  Final   Report Status 10/23/2016 FINAL  Final  Culture, respiratory (NON-Expectorated)     Status: None (Preliminary result)   Collection Time: 10/23/16  8:45 AM  Result Value Ref Range Status   Specimen Description SPUTUM  Final   Special Requests Normal Reflexed from [id number]  Final   Gram Stain   Final    NO WBC SEEN RARE SQUAMOUS EPITHELIAL CELLS PRESENT NO ORGANISMS SEEN Performed at [facility], [address], [place], [place] [address]    Culture PENDING  Incomplete   Report Status PENDING  Incomplete   Studies/Results: Dg Chest Port 1 View  Result Date: 10/22/2016 CLINICAL DATA:  Atelectasis follow-up EXAM: PORTABLE CHEST 1 VIEW COMPARISON:  10/21/2016 FINDINGS: Progressive collapse of the left lung which is now completely collapsed. Mild shift of the mediastinal structures to the left. Diffuse airspace disease in the right may represent pneumonia or edema and is unchanged. IMPRESSION: Complete collapse left lung which has progressed Diffuse right lung edema/pneumonia unchanged Electronically Signed   By: Franchot Gallo M.D.   On: 10/22/2016 13:29   Dg Swallowing Func-speech Pathology  Result Date: 10/22/2016 Objective Swallowing Evaluation: Type of Study: MBS-Modified Barium Swallow Study Patient Details Name: Jon Cross MRN: [id number] Date of Birth: 11/03/1932 Today's Date: 10/22/2016 Time: SLP Start Time (ACUTE ONLY): 0800-SLP Stop Time (ACUTE ONLY): 0915 SLP Time  Calculation (min) (ACUTE ONLY): 75 min Past Medical History: Past Medical History: Diagnosis Date . Alzheimer disease  . Atrial fibrillation ([place])  . Essential hypertension, benign  . Herpes zoster  . Staphylococcal skin infection  Past Surgical History: Past Surgical History: Procedure Laterality Date . INGUINAL HERNIA REPAIR   . TEE WITHOUT CARDIOVERSION N/A 10/20/2016  Procedure: TRANSESOPHAGEAL ECHOCARDIOGRAM (TEE) WITH PROPOFOL;  Surgeon: Herminio Commons, MD;  Location: [facility];  Service: Cardiovascular;  Laterality: N/A; . TIBIA FRACTURE SURGERY    Open reduction and internal fixation of left tibia fracture . TONSILLECTOMY   . UMBILICAL HERNIA REPAIR   HPI: Jon Cinque Mooreis a 83 y.o.malewith history of dementia, chronic kidney disease stage II A. fib as per the charts was brought to the ER. Patient to follow very weak and mildly confused. As per the report patient has been working in his yard today and [place] when patient was found to be weak and confused. Patient has not complained of any chest pain or shortness of breath nausea vomiting or diarrhea. Has been having persistent cough.In the ER patient is being found to have productive cough is oriented to his name only. Chest x-ray and UA are  unremarkable. Patient was tachycardic and febrile with temperatures around 103F. No signs of meningitis. Patient had blood cultures drawn and since patient was having productive cough and began antibiotics for pneumonia were started. Troponin is found to be very elevated. EKG showssinus tachycardia.CT of the head was unremarkable. BSE ordered. Subjective: Pt alert and c/o right eye discomfort Assessment / Plan / Recommendation CHL IP CLINICAL IMPRESSIONS 10/22/2016 Clinical Impression Pt presents with mild oropharyngeal dysphagia and mild pharyngoesophageal dysphagia characterized by prolonged oral phase with lingual pumping and inefficient bolus prep with solids, decreased velopharyngeal  closure with variable velopharyngeal regurgitation with thins, reduced tongue base retraction and pharyngeal constriction, reduced cricopharyngeal relaxation, and prominent cricopharyngeus resulting in residuals post swallow along posterior pharyngeal wall, pyriforms, and cricopharyngeus across all textures and consistencies (mild to moderate amount). Residuals were reduced with cued repeat/dry swallows (not completely eliminated). Pt also benefited from liquid wash (following puree/mech soft with sip of liquid) to reduce residuals. Pt does appear to have bony protrusion (osteophytes) near C4-5, but does not impinge greatly into pharyngeal space, but could impact pharyngeal pressure given current weakness. Pt had a single episode of trace laryngeal penetration with straw sips thin (when co-occurring velopharyngeal backflow noted) with trace amount of thins entering interarytenoid space, but was immediately expelled. Pt assessed with both thin and nectars via cup and straw sips and self presented when able. No aspiration observed, however pt with fairly consistent throat clear/soft cough after many of his swallows. Pt with prominent cricopharyngeus with reduced relaxation resulting in tail end of bolus remaining near CP and posterior pharyngeal wall. Recommend D1/puree with thin liquids, crush meds as able in puree. Straws OK. Prognosis for advancing diet textures is good with improved endurance and strength. D/W Dr. Carles Collet, and pt medical status tenuous at this time. Recommend po only when alert and upright. RN to help with oral care and brush tongue. F/U SLP at SNF. SLP Visit Diagnosis Dysphagia, oropharyngeal phase (R13.12) Attention and concentration deficit following -- Frontal lobe and executive function deficit following -- Impact on safety and function Mild aspiration risk;Moderate aspiration risk   CHL IP TREATMENT RECOMMENDATION 10/22/2016 Treatment Recommendations Therapy as outlined in treatment plan below    Prognosis 10/22/2016 Prognosis for Safe Diet Advancement Fair Barriers to Reach Goals (No Data) Barriers/Prognosis Comment -- CHL IP DIET RECOMMENDATION 10/22/2016 SLP Diet Recommendations Dysphagia 1 (Puree) solids;Thin liquid Liquid Administration via Straw;Cup Medication Administration Crushed with puree Compensations Slow rate;Multiple dry swallows after each bite/sip;Follow solids with liquid;Effortful swallow Postural Changes Remain semi-upright after after feeds/meals (Comment);Seated upright at 90 degrees   CHL IP OTHER RECOMMENDATIONS 10/22/2016 Recommended Consults -- Oral Care Recommendations Oral care BID;Staff/trained caregiver to provide oral care Other Recommendations Clarify dietary restrictions   CHL IP FOLLOW UP RECOMMENDATIONS 10/22/2016 Follow up Recommendations Skilled Nursing facility   [facility] IP FREQUENCY AND DURATION 10/22/2016 Speech Therapy Frequency (ACUTE ONLY) min 2x/week Treatment Duration 1 week      CHL IP ORAL PHASE 10/22/2016 Oral Phase Impaired Oral - Pudding Teaspoon -- Oral - Pudding Cup -- Oral - Honey Teaspoon -- Oral - Honey Cup -- Oral - Nectar Teaspoon -- Oral - Nectar Cup -- Oral - Nectar Straw -- Oral - Thin Teaspoon -- Oral - Thin Cup -- Oral - Thin Straw Decreased velopharyngeal closure;Nasal reflux Oral - Puree Weak lingual manipulation;Lingual pumping;Delayed oral transit Oral - Mech Soft -- Oral - Regular -- Oral - Multi-Consistency -- Oral - Pill -- Oral Phase - Comment --  CHL  IP PHARYNGEAL PHASE 10/22/2016 Pharyngeal Phase Impaired Pharyngeal- Pudding Teaspoon -- Pharyngeal -- Pharyngeal- Pudding Cup -- Pharyngeal -- Pharyngeal- Honey Teaspoon -- Pharyngeal -- Pharyngeal- Honey Cup -- Pharyngeal -- Pharyngeal- Nectar Teaspoon -- Pharyngeal -- Pharyngeal- Nectar Cup -- Pharyngeal -- Pharyngeal- Nectar Straw Pharyngeal residue - pyriform;Pharyngeal residue - posterior pharnyx;Pharyngeal residue - cp segment;Reduced tongue base retraction;Pharyngeal residue - valleculae  Pharyngeal -- Pharyngeal- Thin Teaspoon -- Pharyngeal -- Pharyngeal- Thin Cup Reduced tongue base retraction;Pharyngeal residue - cp segment;Pharyngeal residue - pyriform Pharyngeal -- Pharyngeal- Thin Straw Reduced tongue base retraction;Penetration/Aspiration during swallow;Pharyngeal residue - pyriform;Pharyngeal residue - cp segment;Nasopharyngeal reflux Pharyngeal Material enters airway, remains ABOVE vocal cords then ejected out;Material does not enter airway Pharyngeal- Puree Delayed swallow initiation-vallecula;Reduced tongue base retraction;Pharyngeal residue - valleculae;Pharyngeal residue - posterior pharnyx;Pharyngeal residue - cp segment;Reduced pharyngeal peristalsis Pharyngeal -- Pharyngeal- Mechanical Soft Reduced pharyngeal peristalsis;Reduced tongue base retraction;Pharyngeal residue - valleculae;Pharyngeal residue - cp segment Pharyngeal -- Pharyngeal- Regular -- Pharyngeal -- Pharyngeal- Multi-consistency -- Pharyngeal -- Pharyngeal- Pill NT Pharyngeal -- Pharyngeal Comment reduced CP relaxation with resultant CP/PPW residue post swallow  CHL IP CERVICAL ESOPHAGEAL PHASE 10/22/2016 Cervical Esophageal Phase Impaired Pudding Teaspoon -- Pudding Cup -- Honey Teaspoon -- Honey Cup -- Nectar Teaspoon -- Nectar Cup -- Nectar Straw Reduced cricopharyngeal relaxation;Prominent cricopharyngeal segment Thin Teaspoon -- Thin Cup -- Thin Straw Reduced cricopharyngeal relaxation;Prominent cricopharyngeal segment Puree Reduced cricopharyngeal relaxation;Prominent cricopharyngeal segment Mechanical Soft -- Regular -- Multi-consistency -- Pill -- Cervical Esophageal Comment tail of bolus remains near UES and PPW Thank you, Genene Churn, [place] Maria Antonia 10/22/2016, 9:48 AM               Medications:  Prior to Admission:  Prescriptions Prior to Admission  Medication Sig Dispense Refill Last Dose  . memantine (NAMENDA) 5 MG tablet Take 5 mg by mouth daily.     Marland Kitchen acetaminophen (TYLENOL)  500 MG tablet Take 500 mg by mouth every 6 (six) hours as needed.     Marland Kitchen aspirin EC 81 MG tablet Take 81 mg by mouth daily.   Taking   Scheduled: . acetylcysteine  4 mL Nebulization TID  . aspirin EC  81 mg Oral Daily  . chlorhexidine  15 mL Mouth Rinse BID  . levalbuterol  1.25 mg Nebulization TID  . mouth rinse  15 mL Mouth Rinse q12n4p  . trimethoprim-polymyxin b  1 drop Right Eye Q4H  . vitamin B-12  500 mcg Oral Daily   Continuous: . sodium chloride 10 mL/hr at 10/23/16 2128  . ampicillin-sulbactam (UNASYN) IV Stopped (10/24/16 0719)  . vancomycin Stopped (10/23/16 1148)   ZJQ:[id number] **OR** acetaminophen  Assesment:He was admitted with MRSA sepsis. He had acute renal failure superimposed on his chronic stage III chronic kidney disease. 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 He has what looks like aspiration pneumonia. Principal Problem:   Sepsis due to methicillin resistant Staphylococcus aureus (MRSA) ([place]) Active Problems:   Essential hypertension, benign   Palliative care encounter   Goals of care, counseling/discussion   DNR (do not resuscitate) discussion   Acute metabolic encephalopathy   MRSA bacteremia   Acute renal failure superimposed on stage 3 chronic kidney disease (HCC)   Dementia without behavioral disturbance   Endocarditis of native valve   Acute respiratory failure with hypoxia (HCC)   Aspiration pneumonia of both lower lobes due to gastric secretions ([place])    Plan: CT of the chest today. If he shows what looks like mucus  plugging he may need to have a bronchoscopy. He has had significant cough with production of a lot of sputum over the last 48 hours.    LOS: 7 days   Rahshawn Remo L 10/24/2016, 8:43 AM

## 2016-10-24 NOTE — Progress Notes (Signed)
Pharmacy Antibiotic Note  Jon Cross is a 83 y.o. male admitted on 10/16/2016 with bacteremia, endocarditis.  Ordered vanc trough for this am but dose already given his am  Plan: Cont Vancomycin 1000mg  IV q24hrs Vanc trough tomorrow Continue unasyn 3gm IV q8 hours  Height: 5\' 6"  (167.6 cm) Weight: 133 lb 12.8 oz (60.7 kg) IBW/kg (Calculated) : 63.8  Temp (24hrs), Avg:97.9 F (36.6 C), Min:97.5 F (36.4 C), Max:98.2 F (36.8 C)   Recent Labs Lab 10/18/16 0406 10/19/16 0641 10/20/16 0617 10/21/16 0723 10/22/16 1331 10/24/16 0754  WBC 12.1* 11.5* 9.7 10.2 10.4  --   CREATININE 1.78* 1.28* 1.39* 1.23 1.27* 1.13    Estimated Creatinine Clearance: 44 mL/min (by C-G formula based on SCr of 1.13 mg/dL).    Allergies  Allergen Reactions  . Dristan Cold [Chlorphen-Pe-Acetaminophen] Nausea Only  . Sulfonamide Derivatives Nausea Only    Antimicrobials this admission: Acyclovir 7/17 >> 7/20 Azithromycin 7/16 >> 7/17 Rocephin 7/16 >> 7/17 Vancomycin 7/16 >> Unasyn  7/20>>  Dose adjustments this admission: N/A  Microbiology results: 7/16 BCx:  BCID +MRSA x 2 bottles 7/15 UCx: no growth 7/17 CSF:  Pending 7/18  BC>> ng final  Thank you for allowing pharmacy to be a part of this patient's care. Talbert CageLora Quindell Shere, PhrmD    .

## 2016-10-24 NOTE — Progress Notes (Signed)
Daily Progress Note   Patient Name: Jon Cross       Date: 10/24/2016 DOB: 1932/08/21  Age: 83 y.o. MRN#: [id number] Attending Physician: Catarina Hartshorn, MD Primary Care Physician: Joette Catching, MD Admit Date: 10/16/2016  Reason for Consultation/Follow-up: Establishing goals of care and Psychosocial/spiritual support  Subjective: Jon Cross is resting quietly in bed. He briefly makes but does not keep eye contact. He has no complaints at this time. He is often difficult to understand. No family of bedside at this time. Present today at bedside is RN to insert PICC line.  Call to [name]. He states that usually his father lives with his brother's wife (sister-in-law). He states that niece Jon Cross listed in the chart is the daughter of the sister-in-law Jon Cross lives with. Jon Cross states that Jon Cross and his sister-in-law "go back and forth". He still states that his preference continues to be for Jon Cross to go to SNF for rehab upon discharge from hospital. He states that he had felt for time that it would be a better choice for Jon Cross to have residential placement in a nursing home, but had not been able to accomplish this.  We talk about Jon Cross CT results and that Dr. Juanetta Gosling will determine need for bronchoscopy. Jon Cross gives his informed consent for bronchoscopy be if needed. We talk about negative cultures from cerebrospinal fluid, and DC of acyclovir.  We again today talk about code status, the idea of treat the treatable but no extraordinary measures. Jon Cross states that he and his father had talked about advanced directives in the past, but Jon Cross feels that he needs to review the paperwork. Jon Cross states that he will return home to [place] late Thursday night, will review  paperwork and contact palliative medicine sometime on Friday. I encourage Jon Cross to consider when this paperwork was completed, if 20 years ago, things look different now. I share that only he can no what is important to his father during this time.   Length of Stay: 7  Current Medications: Scheduled Meds:  . acetylcysteine  4 mL Nebulization TID  . aspirin EC  81 mg Oral Daily  . chlorhexidine  15 mL Mouth Rinse BID  . furosemide  40 mg Intravenous Daily  . levalbuterol  1.25 mg Nebulization TID  . mouth  rinse  15 mL Mouth Rinse q12n4p  . potassium chloride  40 mEq Oral Daily  . trimethoprim-polymyxin b  1 drop Right Eye Q4H  . vitamin B-12  500 mcg Oral Daily    Continuous Infusions: . sodium chloride 10 mL/hr at 10/23/16 2128  . ampicillin-sulbactam (UNASYN) IV 3 g (10/24/16 1516)  . vancomycin Stopped (10/24/16 1148)    PRN Meds: acetaminophen **OR** acetaminophen  Physical Exam  Constitutional: No distress.  Appears frail, chronically ill, pleasantly confused  HENT:  Head: Atraumatic.  Cardiovascular: Normal rate.   Pulmonary/Chest: Effort normal. No respiratory distress.  Productive cough  Abdominal: Soft. He exhibits no distension.  Musculoskeletal: He exhibits no edema.  Neurological: He is alert.  Oriented to person only  Skin: Skin is warm and dry.  Nursing note and vitals reviewed.           Vital Signs: BP (!) 141/65 (BP Location: Left Arm)   Pulse 74   Temp 98.9 F (37.2 C) (Oral)   Resp 16   Ht 5\' 6"  (1.676 m)   Wt 60.7 kg (133 lb 12.8 oz)   SpO2 90%   BMI 21.60 kg/m  SpO2: SpO2: 90 % O2 Device: O2 Device: Nasal Cannula O2 Flow Rate: O2 Flow Rate (L/min): 3 L/min  Intake/output summary:  Intake/Output Summary (Last 24 hours) at 10/24/16 1534 Last data filed at 10/24/16 1500  Gross per 24 hour  Intake           1867.5 ml  Output             2400 ml  Net           -532.5 ml   LBM: Last BM Date: 10/23/16 Baseline Weight: Weight: 65.8 kg  (145 lb) Most recent weight: Weight: 60.7 kg (133 lb 12.8 oz)       Palliative Assessment/Data:    Flowsheet Rows     Most Recent Value  Intake Tab  Referral Department  Hospitalist  Unit at Time of Referral  Med/Surg Unit  Palliative Care Primary Diagnosis  Sepsis/Infectious Disease  Date Notified  10/17/16  Palliative Care Type  New Palliative care  Reason for referral  Clarify Goals of Care  Date of Admission  10/16/16  Date first seen by Palliative Care  10/18/16  # of days Palliative referral response time  1 Day(s)  # of days IP prior to Palliative referral  1  Clinical Assessment  Palliative Performance Scale Score  20%  Pain Max last 24 hours  Not able to report  Pain Min Last 24 hours  Not able to report  Dyspnea Max Last 24 Hours  Not able to report  Dyspnea Min Last 24 hours  Not able to report  Psychosocial & Spiritual Assessment  Palliative Care Outcomes  Patient/Family meeting held?  No [Patient is confused, no family at bedside]  Palliative Care Outcomes  Provided psychosocial or spiritual support  Palliative Care follow-up planned  Yes, Facility      Patient Active Problem List   Diagnosis Date Noted  . Acute respiratory failure with hypoxia (HCC) 10/21/2016  . Aspiration pneumonia of both lower lobes due to gastric secretions (HCC) 10/21/2016  . 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 MRSA bacteremia 10/19/2016  . Acute renal failure superimposed on stage 3 chronic kidney disease (HCC) 10/19/2016  . Dementia without behavioral disturbance 10/19/2016  . Acute metabolic encephalopathy 10/18/2016  . Palliative care encounter   . Goals of care, counseling/discussion   .  DNR (do not resuscitate) discussion   . Sepsis due to methicillin resistant Staphylococcus aureus (MRSA) (HCC) 10/17/2016  . Abnormal ECG 08/23/2012  . Essential hypertension, benign 12/11/2008  . PREMATURE ATRIAL CONTRACTIONS 12/11/2008  . CARDIAC MURMUR 12/11/2008    Palliative  Care Assessment & Plan   Patient Profile: 83 y.o.malewith past medical history of Alzheimer's disease, atrial fibrillation, hypertension, staphylococcal skin infection history, herpes zoster infection historyadmitted on 7/15/2018with SI RSS, found to have bacteremia.   Assessment: bacteremia; Jon Cross is being treated with IV antibiotics. TE showed vegetation and he has also been diagnosed with endocarditis. He was being treated with IV acyclovir empirically status post spinal tap. CSF cultures were negative and acyclovir ear discontinued.. Chest CT shows consolidation, possible aspiration   Recommendations/Plan:  at this point continue to treat the treatable. [name] gives his consent for any and all procedures including life-saving measures of CPR and intubation for his father. He is agreeable for bronchoscopy by pulmonologist Dr. Juanetta Gosling if necessary.  Goals of Care and Additional Recommendations:  Limitations on Scope of Treatment: Full Scope Treatment  Code Status:    Code Status Orders        Start     Ordered   10/17/16 0410  Full code  Continuous     10/17/16 0410    Code Status History    Date Active Date Inactive Code Status Order ID Comments User Context   This patient has a current code status but no historical code status.       Prognosis:   Unable to determine based on outcomes. 6 months or less would not be surprising if Mr. Narula does not have resolution of his encephalopathy. This is based on bacteremia and cerebrospinal fluid with increased white blood cells.  Discharge Planning:  [name] is agreeable to SNF for rehab.  Care plan was discussed with nursing staff, case manager, social worker, and Dr. Arbutus Leas.   Thank you for allowing the Palliative Medicine Team to assist in the care of this patient.   Time In: 1315 Time Out: 1350 Total Time 35 minutes Prolonged Time Billed  no       Greater than 50%  of this time was spent counseling and  coordinating care related to the above assessment and plan.  Katheran Awe, NP  Please contact Palliative Medicine Team phone at [phone] for questions and concerns.

## 2016-10-24 NOTE — Progress Notes (Signed)
PROGRESS NOTE  Jon CraneGeorge W Hommes ZOX:[id number] DOB: 01/05/1933 DOA: 10/16/2016 PCP: Joette CatchingNyland, Leonard, MD  Brief History: 83 year old male with a history of Alzheimer's dementia, CKD stage III, atrial fibrillation presented with fevers and confusion. Apparently, the patient was working outside in his yard. Later in the day, the patient was found to have altered mental status with generalized weakness. The patient was found to have temperature 100.59F with WBC 20.5 at the time of admission. Blood cultures are growing MRSA. The patient was started on vancomycin. Lumbar culture was performed on 10/18/2016 and showed WBC14 with 80% neutrophils. The patient was started on empiric acyclovir pending results of his HSV PCR. Acyclovir was discontinued a 1 stage is any PCR was negative. TEE revealed a mitral valve vegetation. Repeat blood cultures were obtained and remained negative. Unfortunately, the patient developed respiratory failure secondary to aspiration pneumonia.He was started on Unasyn. Repeat chest x-ray on 10/21/2016 showed atelectasis involving almost the entire left lung with diffuse infiltrates of the right lung. Pulmonary was consulted to assist.  Assessment/Plan: Sepsis -Secondary to bacteremia -Lactic acid peaked 1.7 -Continue vancomycin -Fluid saline locked secondary to volume overload -leukocytosis improved -remains hemodynamically stable -sepsis physiology resolved  MRSA bacteremia -Source unclear -10/17/16 TTE--EF 50-55%, grade 1 DD, mild AI/TR, trivial pericardial effusion -Surveillance blood cultures--neg to date -Appreciatecardiology for TEE, discussed with Dr. Purvis SheffieldKoneswaran -placed PICC line 7/23  Native valve endocarditis -10/20/16 TEE--1.5 cm x 1.9 cm vegetation on posterior mitral leaflet, trivial MR -likely a poor candidate for any surgical intervention--case discussed with ID, Dr. Luciana Axeomer 7/19 -continue vancomycin  Acute respiratory failure with  hypoxia -Secondary to aspiration pneumonia, pleural effusions and atelectasis of left lung -ContinueUnasyn -Stable on 2 L nasal cannula -10/24/2016--CT chest--mild enlarged mediastinal lymph nodes, bilateral upper lobe opacities, moderate bilateral pleural effusions  Aspiration pneumonia -continueunasyn  Atelectasis of left lung -appreciate pulmonary--?need for bronchoscopy -start mucomyst -10/22/16--personally reviewed CXR--complete collapse of L-lung, R-lung infiltrates  Acute metabolic encephalopathy -Secondary to infectious process -Patient has returned back to near baseline -10/18/2016 lumbar puncture cultures negative -HSV PCR negative--discontinue acyclovir  Elevated troponin -due to demand ischemia -no chest pain -personally reviewed EKG--sinus with T wave inversion V4-V6 without change  Acute on chronic renal failure--CKD stage III -Secondary to sepsis and volume depletion -Baseline creatinine 1.2-1.4 -Serum creatinine peaked at 1.78  Dementia without behavioral disturbance -According to the patient's primary care note on 08/17/2016, he wasonNamenda and Aricept -Unclear if the patient was taking is properly -Restart Namenda 5 mg daily and Aricept 5 mg daily   B12 deficiency -Restart supplementation  Thrombocytopenia -due to sepsis -check fibrinogen--569 -stable-->trending back up with treatment of infection -am CBC  Hypokalemia -repleted -check mag--1.9  Goals of Care -palliative medicine following -full scope of treatment   Disposition Plan: SNF 7/24 if cleared by pulmonary medicine Family Communication: Son updated on phone 7/23   Consultants: cardiology, pulmonary  Code Status: FULL   DVT Prophylaxis: SCDs   Procedures: As Listed in Progress Note Above  Antibiotics: Azithromycin 7/16 Ceftriaxone 7/16-7/17 Acyclovir 7/17>>>10/21/16 Vancomycin 7/16>>> Unasyn 7/20>>>    Subjective: Patient denies  fevers, chills, headache, chest pain, dyspnea, nausea, vomiting, diarrhea, abdominal pain, dysuria, hematuria, hematochezia, and melena.   Objective: Vitals:   10/24/16 0133 10/24/16 0614 10/24/16 0811 10/24/16 1517  BP:  (!) 179/75  (!) 141/65  Pulse:  78  74  Resp:  18  16  Temp:  (!) 97.5 F (36.4 C)  98.9  F (37.2 C)  TempSrc:  Oral  Oral  SpO2: 93% 99% (!) 86% 90%  Weight:  60.7 kg (133 lb 12.8 oz)    Height:        Intake/Output Summary (Last 24 hours) at 10/24/16 1702 Last data filed at 10/24/16 1500  Gross per 24 hour  Intake           1747.5 ml  Output             2400 ml  Net           -652.5 ml   Weight change: -2.609 kg (-5 lb 12 oz) Exam:   General:  Pt is alert, follows commands appropriately, not in acute distress  HEENT: No icterus, No thrush, No neck mass, [place]/AT  Cardiovascular: RRR, S1/S2, no rubs, no gallops  Respiratory: Bibasilar crackles. Diminished breath sounds bilateral bases  Abdomen: Soft/+BS, non tender, non distended, no guarding  Extremities: No edema, No lymphangitis, No petechiae, No rashes, no synovitis   Data Reviewed: I have personally reviewed following labs and imaging studies Basic Metabolic Panel:  Recent Labs Lab 10/19/16 0641 10/20/16 0617 10/21/16 0723 10/22/16 1331 10/24/16 0754  NA 136 138 138 144 140  K 3.4* 3.4* 3.6 3.2* 3.2*  CL 104 101 101 104 100*  CO2 24 26 28 31 30   GLUCOSE 104* 106* 113* 127* 100*  BUN 25* 28* 29* 30* 19  CREATININE 1.28* 1.39* 1.23 1.27* 1.13  CALCIUM 8.0* 8.2* 8.2* 8.4* 8.3*  MG  --  1.7 2.0 1.9  --    Liver Function Tests:  Recent Labs Lab 10/18/16 0406  AST 49*  ALT 32  ALKPHOS 47  BILITOT 0.7  PROT 4.8*  ALBUMIN 2.6*   No results for input(s): LIPASE, AMYLASE in the last 168 hours. No results for input(s): AMMONIA in the last 168 hours. Coagulation Profile: No results for input(s): INR, PROTIME in the last 168 hours. CBC:  Recent Labs Lab 10/18/16 0406  10/19/16 0641 10/20/16 0617 10/21/16 0723 10/22/16 1331  WBC 12.1* 11.5* 9.7 10.2 10.4  HGB 11.9* 12.9* 13.1 13.2 13.1  HCT 35.8* 38.0* 38.4* 39.3 38.5*  MCV 97.0 94.3 93.7 94.7 94.1  PLT 71* 60* 64* 78* 121*   Cardiac Enzymes: No results for input(s): CKTOTAL, CKMB, CKMBINDEX, TROPONINI in the last 168 hours. BNP: Invalid input(s): POCBNP CBG: No results for input(s): GLUCAP in the last 168 hours. HbA1C: No results for input(s): HGBA1C in the last 72 hours. Urine analysis:    Component Value Date/Time   COLORURINE YELLOW 10/16/2016 2313   APPEARANCEUR CLEAR 10/16/2016 2313   LABSPEC 1.018 10/16/2016 2313   PHURINE 7.0 10/16/2016 2313   GLUCOSEU NEGATIVE 10/16/2016 2313   HGBUR NEGATIVE 10/16/2016 2313   BILIRUBINUR NEGATIVE 10/16/2016 2313   KETONESUR NEGATIVE 10/16/2016 2313   PROTEINUR 30 (A) 10/16/2016 2313   NITRITE NEGATIVE 10/16/2016 2313   LEUKOCYTESUR NEGATIVE 10/16/2016 2313   Sepsis Labs: @LABRCNTIP (procalcitonin:4,lacticidven:4) ) Recent Results (from the past 240 hour(s))  Urine culture     Status: None   Collection Time: 10/16/16 11:13 PM  Result Value Ref Range Status   Specimen Description URINE, CLEAN CATCH  Final   Special Requests NONE  Final   Culture   Final    NO GROWTH Performed at [facility], [address] [place], [place] [address]    Report Status 10/18/2016 FINAL  Final  Blood Culture (routine x 2)     Status: Abnormal  Collection Time: 10/17/16 12:37 AM  Result Value Ref Range Status   Specimen Description BLOOD RIGHT ARM  Final   Special Requests   Final    BOTTLES DRAWN AEROBIC AND ANAEROBIC Blood Culture adequate volume   Culture  Setup Time   Final    GRAM POSITIVE COCCI Gram Stain Report Called to,Read Back By and Verified With: BIVENS,L @ 1243 ON 7.16.18 BY BOWMAN,L IN BOTH AEROBIC AND ANAEROBIC BOTTLES    Culture METHICILLIN RESISTANT STAPHYLOCOCCUS AUREUS (A)  Final   Report Status 10/19/2016 FINAL  Final    Organism ID, Bacteria METHICILLIN RESISTANT STAPHYLOCOCCUS AUREUS  Final      Susceptibility   Methicillin resistant staphylococcus aureus - MIC*    CIPROFLOXACIN <=0.5 SENSITIVE Sensitive     ERYTHROMYCIN >=8 RESISTANT Resistant     GENTAMICIN <=0.5 SENSITIVE Sensitive     OXACILLIN >=4 RESISTANT Resistant     TETRACYCLINE <=1 SENSITIVE Sensitive     VANCOMYCIN 1 SENSITIVE Sensitive     TRIMETH/SULFA <=10 SENSITIVE Sensitive     CLINDAMYCIN <=0.25 SENSITIVE Sensitive     RIFAMPIN <=0.5 SENSITIVE Sensitive     Inducible Clindamycin NEGATIVE Sensitive     * METHICILLIN RESISTANT STAPHYLOCOCCUS AUREUS  Blood Culture ID Panel (Reflexed)     Status: Abnormal   Collection Time: 10/17/16 12:37 AM  Result Value Ref Range Status   Enterococcus species NOT DETECTED NOT DETECTED Final   Listeria monocytogenes NOT DETECTED NOT DETECTED Final   Staphylococcus species DETECTED (A) NOT DETECTED Final    Comment: CRITICAL RESULT CALLED TO, READ BACK BY AND VERIFIED WITH: Hector Brunswick RN, AT 1844 10/17/16 BY D. VANHOOK    Staphylococcus aureus DETECTED (A) NOT DETECTED Final    Comment: Methicillin (oxacillin)-resistant Staphylococcus aureus (MRSA). MRSA is predictably resistant to beta-lactam antibiotics (except ceftaroline). Preferred therapy is vancomycin unless clinically contraindicated. Patient requires contact precautions if  hospitalized. CRITICAL RESULT CALLED TO, READ BACK BY AND VERIFIED WITH: L. BIVENS RN, AT 1844 10/17/16 BY D. VANHOOK    Methicillin resistance DETECTED (A) NOT DETECTED Final    Comment: CRITICAL RESULT CALLED TO, READ BACK BY AND VERIFIED WITH: L. BIVENS RN, AT 1844 10/17/16 BY D. VANHOOK    Streptococcus species NOT DETECTED NOT DETECTED Final   Streptococcus agalactiae NOT DETECTED NOT DETECTED Final   Streptococcus pneumoniae NOT DETECTED NOT DETECTED Final   Streptococcus pyogenes NOT DETECTED NOT DETECTED Final   Acinetobacter baumannii NOT DETECTED NOT DETECTED  Final   Enterobacteriaceae species NOT DETECTED NOT DETECTED Final   Enterobacter cloacae complex NOT DETECTED NOT DETECTED Final   Escherichia coli NOT DETECTED NOT DETECTED Final   Klebsiella oxytoca NOT DETECTED NOT DETECTED Final   Klebsiella pneumoniae NOT DETECTED NOT DETECTED Final   Proteus species NOT DETECTED NOT DETECTED Final   Serratia marcescens NOT DETECTED NOT DETECTED Final   Haemophilus influenzae NOT DETECTED NOT DETECTED Final   Neisseria meningitidis NOT DETECTED NOT DETECTED Final   Pseudomonas aeruginosa NOT DETECTED NOT DETECTED Final   Candida albicans NOT DETECTED NOT DETECTED Final   Candida glabrata NOT DETECTED NOT DETECTED Final   Candida krusei NOT DETECTED NOT DETECTED Final   Candida parapsilosis NOT DETECTED NOT DETECTED Final   Candida tropicalis NOT DETECTED NOT DETECTED Final    Comment: Performed at [facility], [address], [place], [place] [address]  Blood Culture (routine x 2)     Status: Abnormal   Collection Time: 10/17/16 12:46 AM  Result  Value Ref Range Status   Specimen Description BLOOD LEFT ARM  Final   Special Requests   Final    BOTTLES DRAWN AEROBIC ONLY Blood Culture results may not be optimal due to an inadequate volume of blood received in culture bottles   Culture  Setup Time   Final    GRAM POSITIVE COCCI Gram Stain Report Called to,Read Back By and Verified With: BIVENS,L @ 1243 ON 7.16.18 BY BOWMAN,L AEROBIC BOTTLE ONLY    Culture (A)  Final    STAPHYLOCOCCUS AUREUS SUSCEPTIBILITIES PERFORMED ON PREVIOUS CULTURE WITHIN THE LAST 5 DAYS. Performed at [facility], [address], [place], [place] [address]    Report Status 10/19/2016 FINAL  Final  Gram stain     Status: None   Collection Time: 10/18/16 10:34 AM  Result Value Ref Range Status   Specimen Description CSF  Final   Special Requests NONE  Final   Gram Stain   Final    CYTOSPIN SMEAR WBC SEEN WBC PRESENT,BOTH PMN AND MONONUCLEAR NO ORGANISMS SEEN    Report Status 10/18/2016 FINAL  Final  CSF culture     Status: None   Collection Time: 10/18/16 10:34 AM  Result Value Ref Range Status   Specimen Description CSF  Final   Special Requests NONE  Final   Gram Stain   Final    CYTOSPIN SMEAR NO ORGANISMS SEEN WBC SEEN WBC PRESENT,BOTH PMN AND MONONUCLEAR    Culture   Final    NO GROWTH 3 DAYS Performed at [facility], [address], [place], [place] [address]    Report Status 10/22/2016 FINAL  Final  Culture, blood (Routine X 2) w Reflex to ID Panel     Status: None   Collection Time: 10/19/16  6:41 AM  Result Value Ref Range Status   Specimen Description BLOOD RIGHT ARM  Final   Special Requests Blood Culture adequate volume  Final   Culture NO GROWTH 5 DAYS  Final   Report Status 10/24/2016 FINAL  Final  Culture, blood (Routine X 2) w Reflex to ID Panel     Status: None   Collection Time: 10/19/16  6:52 AM  Result Value Ref Range Status   Specimen Description BLOOD RIGHT HAND  Final   Special Requests Blood Culture adequate volume  Final   Culture NO GROWTH 5 DAYS  Final   Report Status 10/24/2016 FINAL  Final  Culture, sputum-assessment     Status: None   Collection Time: 10/22/16  4:00 PM  Result Value Ref Range Status   Specimen Description EXPECTORATED SPUTUM  Final   Special Requests NONE  Final   Sputum evaluation   Final    Sputum specimen not acceptable for testing.  Please recollect.   RESULT CALLED TO, READ BACK BY AND VERIFIED WITH: GARZON,S. AT 1836 ON07/21/2018 BY EVA   Report Status 10/22/2016 FINAL  Final  Culture, expectorated sputum-assessment     Status: None   Collection Time: 10/23/16  8:45 AM  Result Value Ref Range Status   Specimen Description SPUTUM  Final   Special Requests Normal  Final   Sputum evaluation THIS SPECIMEN IS ACCEPTABLE FOR SPUTUM CULTURE  Final   Report Status 10/23/2016 FINAL  Final  Culture, respiratory (NON-Expectorated)     Status: None (Preliminary result)    Collection Time: 10/23/16  8:45 AM  Result Value Ref Range Status   Specimen Description SPUTUM  Final   Special Requests Normal Reflexed  from [id number]  Final   Gram Stain   Final    NO WBC SEEN RARE SQUAMOUS EPITHELIAL CELLS PRESENT NO ORGANISMS SEEN Performed at [facility], [address] [place], [place] [address]    Culture PENDING  Incomplete   Report Status PENDING  Incomplete     Scheduled Meds: . acetylcysteine  4 mL Nebulization TID  . aspirin EC  81 mg Oral Daily  . chlorhexidine  15 mL Mouth Rinse BID  . furosemide  40 mg Intravenous Daily  . levalbuterol  1.25 mg Nebulization TID  . mouth rinse  15 mL Mouth Rinse q12n4p  . potassium chloride  40 mEq Oral Daily  . trimethoprim-polymyxin b  1 drop Right Eye Q4H  . vitamin B-12  500 mcg Oral Daily   Continuous Infusions: . sodium chloride 10 mL/hr at 10/23/16 2128  . ampicillin-sulbactam (UNASYN) IV Stopped (10/24/16 1546)  . vancomycin Stopped (10/24/16 1148)    Procedures/Studies: Dg Chest 2 View  Result Date: 10/16/2016 CLINICAL DATA:  Altered mental status and weakness after extended time outside. EXAM: CHEST  2 VIEW COMPARISON:  Chest radiograph June 24, 2014 FINDINGS: Cardiac silhouette is moderately enlarged. Calcified aortic knob. Similar mild chronic interstitial changes without pleural effusion or focal consolidation. Increased lung volumes. No pneumothorax. Accentuated kyphosis, chronic approximate T10 compression fracture. Osteopenia. IMPRESSION: Stable cardiomegaly and COPD. Electronically Signed   By: Awilda Metro M.D.   On: 10/16/2016 23:34   Ct Head Wo Contrast  Result Date: 10/16/2016 CLINICAL DATA:  83 year old male with altered mental status and weakness today. EXAM: CT HEAD WITHOUT CONTRAST TECHNIQUE: Contiguous axial images were obtained from the base of the skull through the vertex without intravenous contrast. COMPARISON:  None. FINDINGS: Brain: Mild cerebral atrophy. Patchy and  confluent areas of decreased attenuation are noted throughout the deep and periventricular white matter of the cerebral hemispheres bilaterally, compatible with chronic microvascular ischemic disease. No evidence of acute infarction, hemorrhage, hydrocephalus, extra-axial collection or mass lesion/mass effect. Vascular: No hyperdense vessel or unexpected calcification. Skull: Normal. Negative for fracture or focal lesion. Sinuses/Orbits: Mild multifocal mucosal thickening in the ethmoid sinuses. No acute finding. Other: None. IMPRESSION: 1. No acute intracranial abnormalities. 2. Mild cerebral atrophy with mild chronic microvascular ischemic changes in the cerebral white matter. Electronically Signed   By: Trudie Reed M.D.   On: 10/16/2016 23:24   Ct Chest W Contrast  Result Date: 10/24/2016 CLINICAL DATA:  Productive cough, fever.  Atelectasis. EXAM: CT CHEST WITH CONTRAST TECHNIQUE: Multidetector CT imaging of the chest was performed during intravenous contrast administration. CONTRAST:  75mL ISOVUE-300 IOPAMIDOL (ISOVUE-300) INJECTION 61% COMPARISON:  Radiograph of October 22, 2016. FINDINGS: Cardiovascular: There is no evidence of thoracic aortic dissection or aneurysm. Aortic atherosclerosis is noted. Coronary artery calcifications are noted. No pericardial effusion is noted. Mediastinum/Nodes: Mildly enlarged mediastinal adenopathy is noted with 1.5 cm lymph node seen in subcarinal region 14 mm right peritracheal lymph node is noted. 8 mm lymph node is noted in aortopulmonary window. Lungs/Pleura: No pneumothorax is noted. Airspace opacities are noted in both upper lobes concerning for pneumonia. Left lower lobe atelectasis or pneumonia is noted as well. Moderate bilateral pleural effusions are noted. Upper Abdomen: No acute abnormality. Musculoskeletal: No chest wall abnormality. No acute or significant osseous findings. IMPRESSION: Coronary artery calcifications are noted suggesting coronary artery  disease. Bilateral pulmonary airspace opacities are noted concerning for multifocal pneumonia. Moderate bilateral pleural effusions are noted. Mildly enlarged mediastinal adenopathy is noted  which most likely is inflammatory or reactive in etiology, but neoplasm cannot be excluded. Clinical correlation is recommended. Aortic Atherosclerosis (ICD10-I70.0). Electronically Signed   By: Lupita Raider, M.D.   On: 10/24/2016 09:47   Dg Chest Port 1 View  Result Date: 10/24/2016 CLINICAL DATA:  Status post PICC line placement EXAM: PORTABLE CHEST 1 VIEW COMPARISON:  10/22/2016 FINDINGS: Cardiac shadow is enlarged. Significant improved aeration on the left is noted when compare with the prior exam. Some residual effusion is noted. Patchy infiltrative changes are noted bilaterally. The PICC line is now seen with the catheter tip at the cavoatrial junction. No acute bony abnormality is noted. IMPRESSION: Multifocal pneumonia and left-sided effusion. Status post PICC line placement in satisfactory position. Electronically Signed   By: Alcide Clever M.D.   On: 10/24/2016 15:35   Dg Chest Port 1 View  Result Date: 10/22/2016 CLINICAL DATA:  Atelectasis follow-up EXAM: PORTABLE CHEST 1 VIEW COMPARISON:  10/21/2016 FINDINGS: Progressive collapse of the left lung which is now completely collapsed. Mild shift of the mediastinal structures to the left. Diffuse airspace disease in the right may represent pneumonia or edema and is unchanged. IMPRESSION: Complete collapse left lung which has progressed Diffuse right lung edema/pneumonia unchanged Electronically Signed   By: Marlan Palau M.D.   On: 10/22/2016 13:29   Dg Chest Port 1 View  Result Date: 10/21/2016 CLINICAL DATA:  Hypoxia. EXAM: PORTABLE CHEST 1 VIEW COMPARISON:  10/17/2016 . FINDINGS: Cardiomegaly is again noted. Near complete opacification of the left lung with volume loss noted. This is consistent with atelectasis. Diffuse right lung infiltrate and/or  edema. Left-sided pleural effusion cannot be excluded. No pneumothorax . IMPRESSION: 1. Near complete opacification left lung with volume loss. This consistent with left lung atelectasis. Underlying infiltrate cannot be excluded. Left-sided pleural effusion cannot be excluded. 2.  Diffuse infiltrate/edema right lung. 3. Persistent cardiomegaly. Electronically Signed   By: Maisie Fus  Register   On: 10/21/2016 07:44   Dg Chest Port 1 View  Result Date: 10/17/2016 CLINICAL DATA:  83 year old male with fever.  Subsequent encounter. EXAM: PORTABLE CHEST 1 VIEW COMPARISON:  10/16/2016. FINDINGS: Cardiomegaly. Pulmonary vascular congestion. No segmental consolidation. Slightly limited evaluation of retrocardiac region. Calcified slightly tortuous aorta. IMPRESSION: Cardiomegaly. Pulmonary vascular congestion. No segmental consolidation. Slightly limited evaluation of retrocardiac region. Electronically Signed   By: Lacy Duverney M.D.   On: 10/17/2016 14:42   Dg Swallowing Func-speech Pathology  Result Date: 10/22/2016 Objective Swallowing Evaluation: Type of Study: MBS-Modified Barium Swallow Study Patient Details Name: ANANT AGARD MRN: [id number] Date of Birth: 1933-02-01 Today's Date: 10/22/2016 Time: SLP Start Time (ACUTE ONLY): 0800-SLP Stop Time (ACUTE ONLY): 0915 SLP Time Calculation (min) (ACUTE ONLY): 75 min Past Medical History: Past Medical History: Diagnosis Date . Alzheimer disease  . Atrial fibrillation (HCC)  . Essential hypertension, benign  . Herpes zoster  . Staphylococcal skin infection  Past Surgical History: Past Surgical History: Procedure Laterality Date . INGUINAL HERNIA REPAIR   . TEE WITHOUT CARDIOVERSION N/A 10/20/2016  Procedure: TRANSESOPHAGEAL ECHOCARDIOGRAM (TEE) WITH PROPOFOL;  Surgeon: Laqueta Linden, MD;  Location: [facility];  Service: Cardiovascular;  Laterality: N/A; . TIBIA FRACTURE SURGERY    Open reduction and internal fixation of left tibia fracture . TONSILLECTOMY   .  UMBILICAL HERNIA REPAIR   HPI: Raidon Swanner Mooreis a 83 y.o.malewith history of dementia, chronic kidney disease stage II A. fib as per the charts was brought to the ER. Patient to follow very weak  and mildly confused. As per the report patient has been working in his yard today and [place] when patient was found to be weak and confused. Patient has not complained of any chest pain or shortness of breath nausea vomiting or diarrhea. Has been having persistent cough.In the ER patient is being found to have productive cough is oriented to his name only. Chest x-ray and UA are unremarkable. Patient was tachycardic and febrile with temperatures around 103F. No signs of meningitis. Patient had blood cultures drawn and since patient was having productive cough and began antibiotics for pneumonia were started. Troponin is found to be very elevated. EKG showssinus tachycardia.CT of the head was unremarkable. BSE ordered. Subjective: Pt alert and c/o right eye discomfort Assessment / Plan / Recommendation CHL IP CLINICAL IMPRESSIONS 10/22/2016 Clinical Impression Pt presents with mild oropharyngeal dysphagia and mild pharyngoesophageal dysphagia characterized by prolonged oral phase with lingual pumping and inefficient bolus prep with solids, decreased velopharyngeal closure with variable velopharyngeal regurgitation with thins, reduced tongue base retraction and pharyngeal constriction, reduced cricopharyngeal relaxation, and prominent cricopharyngeus resulting in residuals post swallow along posterior pharyngeal wall, pyriforms, and cricopharyngeus across all textures and consistencies (mild to moderate amount). Residuals were reduced with cued repeat/dry swallows (not completely eliminated). Pt also benefited from liquid wash (following puree/mech soft with sip of liquid) to reduce residuals. Pt does appear to have bony protrusion (osteophytes) near C4-5, but does not impinge greatly into  pharyngeal space, but could impact pharyngeal pressure given current weakness. Pt had a single episode of trace laryngeal penetration with straw sips thin (when co-occurring velopharyngeal backflow noted) with trace amount of thins entering interarytenoid space, but was immediately expelled. Pt assessed with both thin and nectars via cup and straw sips and self presented when able. No aspiration observed, however pt with fairly consistent throat clear/soft cough after many of his swallows. Pt with prominent cricopharyngeus with reduced relaxation resulting in tail end of bolus remaining near CP and posterior pharyngeal wall. Recommend D1/puree with thin liquids, crush meds as able in puree. Straws OK. Prognosis for advancing diet textures is good with improved endurance and strength. D/W Dr. Arbutus Leas, and pt medical status tenuous at this time. Recommend po only when alert and upright. RN to help with oral care and brush tongue. F/U SLP at SNF. SLP Visit Diagnosis Dysphagia, oropharyngeal phase (R13.12) Attention and concentration deficit following -- Frontal lobe and executive function deficit following -- Impact on safety and function Mild aspiration risk;Moderate aspiration risk   CHL IP TREATMENT RECOMMENDATION 10/22/2016 Treatment Recommendations Therapy as outlined in treatment plan below   Prognosis 10/22/2016 Prognosis for Safe Diet Advancement Fair Barriers to Reach Goals (No Data) Barriers/Prognosis Comment -- CHL IP DIET RECOMMENDATION 10/22/2016 SLP Diet Recommendations Dysphagia 1 (Puree) solids;Thin liquid Liquid Administration via Straw;Cup Medication Administration Crushed with puree Compensations Slow rate;Multiple dry swallows after each bite/sip;Follow solids with liquid;Effortful swallow Postural Changes Remain semi-upright after after feeds/meals (Comment);Seated upright at 90 degrees   CHL IP OTHER RECOMMENDATIONS 10/22/2016 Recommended Consults -- Oral Care Recommendations Oral care BID;Staff/trained  caregiver to provide oral care Other Recommendations Clarify dietary restrictions   CHL IP FOLLOW UP RECOMMENDATIONS 10/22/2016 Follow up Recommendations Skilled Nursing facility   [facility] IP FREQUENCY AND DURATION 10/22/2016 Speech Therapy Frequency (ACUTE ONLY) min 2x/week Treatment Duration 1 week      CHL IP ORAL PHASE 10/22/2016 Oral Phase Impaired Oral - Pudding Teaspoon -- Oral - Pudding Cup -- Oral - Honey  Teaspoon -- Oral - Honey Cup -- Oral - Nectar Teaspoon -- Oral - Nectar Cup -- Oral - Nectar Straw -- Oral - Thin Teaspoon -- Oral - Thin Cup -- Oral - Thin Straw Decreased velopharyngeal closure;Nasal reflux Oral - Puree Weak lingual manipulation;Lingual pumping;Delayed oral transit Oral - Mech Soft -- Oral - Regular -- Oral - Multi-Consistency -- Oral - Pill -- Oral Phase - Comment --  CHL IP PHARYNGEAL PHASE 10/22/2016 Pharyngeal Phase Impaired Pharyngeal- Pudding Teaspoon -- Pharyngeal -- Pharyngeal- Pudding Cup -- Pharyngeal -- Pharyngeal- Honey Teaspoon -- Pharyngeal -- Pharyngeal- Honey Cup -- Pharyngeal -- Pharyngeal- Nectar Teaspoon -- Pharyngeal -- Pharyngeal- Nectar Cup -- Pharyngeal -- Pharyngeal- Nectar Straw Pharyngeal residue - pyriform;Pharyngeal residue - posterior pharnyx;Pharyngeal residue - cp segment;Reduced tongue base retraction;Pharyngeal residue - valleculae Pharyngeal -- Pharyngeal- Thin Teaspoon -- Pharyngeal -- Pharyngeal- Thin Cup Reduced tongue base retraction;Pharyngeal residue - cp segment;Pharyngeal residue - pyriform Pharyngeal -- Pharyngeal- Thin Straw Reduced tongue base retraction;Penetration/Aspiration during swallow;Pharyngeal residue - pyriform;Pharyngeal residue - cp segment;Nasopharyngeal reflux Pharyngeal Material enters airway, remains ABOVE vocal cords then ejected out;Material does not enter airway Pharyngeal- Puree Delayed swallow initiation-vallecula;Reduced tongue base retraction;Pharyngeal residue - valleculae;Pharyngeal residue - posterior pharnyx;Pharyngeal  residue - cp segment;Reduced pharyngeal peristalsis Pharyngeal -- Pharyngeal- Mechanical Soft Reduced pharyngeal peristalsis;Reduced tongue base retraction;Pharyngeal residue - valleculae;Pharyngeal residue - cp segment Pharyngeal -- Pharyngeal- Regular -- Pharyngeal -- Pharyngeal- Multi-consistency -- Pharyngeal -- Pharyngeal- Pill NT Pharyngeal -- Pharyngeal Comment reduced CP relaxation with resultant CP/PPW residue post swallow  CHL IP CERVICAL ESOPHAGEAL PHASE 10/22/2016 Cervical Esophageal Phase Impaired Pudding Teaspoon -- Pudding Cup -- Honey Teaspoon -- Honey Cup -- Nectar Teaspoon -- Nectar Cup -- Nectar Straw Reduced cricopharyngeal relaxation;Prominent cricopharyngeal segment Thin Teaspoon -- Thin Cup -- Thin Straw Reduced cricopharyngeal relaxation;Prominent cricopharyngeal segment Puree Reduced cricopharyngeal relaxation;Prominent cricopharyngeal segment Mechanical Soft -- Regular -- Multi-consistency -- Pill -- Cervical Esophageal Comment tail of bolus remains near UES and PPW Thank you, Havery Moros, CCC-SLP [phone] PORTER,DABNEY 10/22/2016, 9:48 AM              Dg Fluoro Guide Lumbar Puncture  Result Date: 10/18/2016 CLINICAL DATA:  Fever EXAM: DIAGNOSTIC LUMBAR PUNCTURE UNDER FLUOROSCOPIC GUIDANCE FLUOROSCOPY TIME:  Fluoroscopy Time:  0 minutes 30 seconds Radiation Exposure Index (if provided by the fluoroscopic device): 4.7 mGy Number of Acquired Spot Images: 2 screen captures during fluoroscopy PROCEDURE: Procedure, benefits, and risks were discussed with the patient, including alternatives. Patient's questions were answered. Written informed consent was obtained by the service from the patient's son. Timeout protocol followed. Patient placed prone. L4-L5 disc space was localized under fluoroscopy. Skin prepped and draped in usual sterile fashion. Skin and soft tissues anesthetized with 2 mL of 1% lidocaine. A 22 gauge needle was advanced into the spinal canal where clear colorless CSF  was encountered. 10.5 mL of CSF was obtained in 4 tubes for requested analysis. Procedure tolerated very well by patient without immediate complication. IMPRESSION: Successful fluoroscopic guided lumbar puncture as above. Electronically Signed   By: Ulyses Southward M.D.   On: 10/18/2016 11:42    Quamel Fitzmaurice, DO  [facility] Pager [phone]  If 7PM-7AM, please contact night-coverage [email or web address] Password [email or web address] 10/24/2016, 5:02 PM   LOS: 7 days

## 2016-10-25 LAB — BASIC METABOLIC PANEL
ANION GAP: 8 (ref 5–15)
BUN: 19 mg/dL (ref 6–20)
CALCIUM: 8.3 mg/dL — AB (ref 8.9–10.3)
CO2: 35 mmol/L — AB (ref 22–32)
CREATININE: 1.31 mg/dL — AB (ref 0.61–1.24)
Chloride: 98 mmol/L — ABNORMAL LOW (ref 101–111)
GFR calc Af Amer: 57 mL/min — ABNORMAL LOW (ref >60)
GFR, EST NON AFRICAN AMERICAN: 49 mL/min — AB (ref >60)
Glucose, Bld: 85 mg/dL (ref 65–99)
Potassium: 3.2 mmol/L — ABNORMAL LOW (ref 3.5–5.1)
Sodium: 141 mmol/L (ref 135–145)

## 2016-10-25 LAB — VANCOMYCIN, TROUGH: Vancomycin Tr: 14 ug/mL — ABNORMAL LOW (ref 15–20)

## 2016-10-25 LAB — MAGNESIUM: Magnesium: 1.9 mg/dL (ref 1.7–2.4)

## 2016-10-25 MED ORDER — POLYMYXIN B-TRIMETHOPRIM 10000-0.1 UNIT/ML-% OP SOLN: 1.0000 [drp] | OPHTHALMIC | 0 refills | Status: AC

## 2016-10-25 MED ORDER — MEMANTINE HCL 10 MG PO TABS: 10.0000 mg | ORAL_TABLET | Freq: Two times a day (BID) | ORAL | 0 refills | Status: DC

## 2016-10-25 MED ORDER — MEMANTINE HCL 5 MG PO TABS: 5.0000 mg | ORAL_TABLET | Freq: Every day | ORAL | 0 refills | Status: DC

## 2016-10-25 MED ORDER — DONEPEZIL HCL 5 MG PO TABS: 5.0000 mg | ORAL_TABLET | Freq: Every day | ORAL | 0 refills | Status: DC

## 2016-10-25 MED ORDER — AMOXICILLIN-POT CLAVULANATE 875-125 MG PO TABS: 1.0000 | ORAL_TABLET | Freq: Two times a day (BID) | ORAL | 0 refills | Status: DC

## 2016-10-25 MED ORDER — VANCOMYCIN HCL IN DEXTROSE 1-5 GM/200ML-% IV SOLN: 1000.0000 mg | INTRAVENOUS | 35 refills | Status: DC

## 2016-10-25 MED ORDER — MAGNESIUM SULFATE 50 % IJ SOLN
2.0000 g | Freq: Once | INTRAVENOUS | Status: AC
  Administered 2016-10-25: 2 g via INTRAVENOUS
  Filled 2016-10-25: qty 4

## 2016-10-25 MED ORDER — CYANOCOBALAMIN 500 MCG PO TABS: 500.0000 ug | ORAL_TABLET | Freq: Every day | ORAL | 0 refills | Status: AC

## 2016-10-25 MED ORDER — DONEPEZIL HCL 5 MG PO TABS: 5.0000 mg | ORAL_TABLET | Freq: Every day | ORAL | 2 refills | Status: DC

## 2016-10-25 MED ORDER — DONEPEZIL HCL 10 MG PO TABS: 10.0000 mg | ORAL_TABLET | Freq: Every day | ORAL | 0 refills | Status: DC

## 2016-10-25 MED ORDER — VANCOMYCIN HCL 10 G IV SOLR
1250.0000 mg | INTRAVENOUS | Status: DC
  Administered 2016-10-26: 1250 mg via INTRAVENOUS
  Filled 2016-10-25 (×2): qty 1250

## 2016-10-25 MED ORDER — LEVALBUTEROL HCL 1.25 MG/0.5ML IN NEBU: 1.2500 mg | INHALATION_SOLUTION | Freq: Three times a day (TID) | RESPIRATORY_TRACT | 12 refills | Status: DC

## 2016-10-25 MED ORDER — MEMANTINE HCL 10 MG PO TABS: 10.0000 mg | ORAL_TABLET | Freq: Every day | ORAL | 0 refills | Status: AC

## 2016-10-25 MED ORDER — AMOXICILLIN-POT CLAVULANATE 875-125 MG PO TABS
1.0000 | ORAL_TABLET | Freq: Two times a day (BID) | ORAL | Status: DC
  Administered 2016-10-25 – 2016-10-26 (×3): 1 via ORAL
  Filled 2016-10-25 (×3): qty 1

## 2016-10-25 NOTE — Progress Notes (Signed)
Pharmacy Antibiotic Note  Gershon CraneGeorge W Gasca is a 83 y.o. male admitted on 10/16/2016 with bacteremia, endocarditis.   Vanc trough is OK but slightly below target.  (level = 14 on 1gm IV q24hrs)  Plan: Increase Vancomycin to 1250mg  IV q24hrs (to target trough level 15-20) Vanc trough weekly or sooner if warranted Monitor labs, progress, renal fxn, c/s Augmentin 875mg  po q12hrs per MD  Height: 5\' 6"  (167.6 cm) Weight: 130 lb 14.4 oz (59.4 kg) IBW/kg (Calculated) : 63.8  Temp (24hrs), Avg:98.5 F (36.9 C), Min:97.9 F (36.6 C), Max:98.9 F (37.2 C)   Recent Labs Lab 10/19/16 0641 10/20/16 0617 10/21/16 0723 10/22/16 1331 10/24/16 0754 10/25/16 0438 10/25/16 1044  WBC 11.5* 9.7 10.2 10.4  --   --   --   CREATININE 1.28* 1.39* 1.23 1.27* 1.13 1.31*  --   VANCOTROUGH  --   --   --   --   --   --  14*    Estimated Creatinine Clearance: 37.2 mL/min (A) (by C-G formula based on SCr of 1.31 mg/dL (H)).    Allergies  Allergen Reactions  . Dristan Cold [Chlorphen-Pe-Acetaminophen] Nausea Only  . Sulfonamide Derivatives Nausea Only   Antimicrobials this admission: Acyclovir 7/17 >> 7/20 Azithromycin 7/16 >> 7/17 Rocephin 7/16 >> 7/17 Vancomycin 7/16 >> Unasyn  7/20>>7/23 Augmentin 7/24 >>  Dose adjustments this admission: Dose increased on 10/25/16  Microbiology results: 7/16 BCx:  BCID +MRSA x 2 bottles 7/15 UCx: no growth 7/17 CSF:  Pending 7/18  BC>> ng final  Valrie HartScott Danzel Marszalek, PharmD Clinical Pharmacist Pager:  [phone] 10/25/2016     .

## 2016-10-25 NOTE — NC FL2 (Signed)
[place] MEDICAID FL2 LEVEL OF CARE SCREENING TOOL     IDENTIFICATION  Patient Name: Jon Cross Birthdate: 05-26-1932 Sex: male Admission Date (Current Location): 10/16/2016  [facility] and [place] Number:  [employer] and Address:  [facility],  [address] [place] [address]      Provider Number: [id number]  Attending Physician Name and Address:  Catarina Hartshorn, MD  Relative Name and Phone Number:       Current Level of Care: Hospital Recommended Level of Care: Skilled Nursing Facility Prior Approval Number:    Date Approved/Denied:   PASRR Number: [id number] ([id number])  Discharge Plan: SNF    Current Diagnoses: Patient Active Problem List   Diagnosis Date Noted  . Aspiration pneumonia of both upper lobes due to gastric secretions (HCC) 10/24/2016  . Acute respiratory failure with hypoxia (HCC) 10/21/2016  . Aspiration pneumonia of both lower lobes due to gastric secretions (HCC) 10/21/2016  . 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 MRSA bacteremia 10/19/2016  . Acute renal failure superimposed on stage 3 chronic kidney disease (HCC) 10/19/2016  . Dementia without behavioral disturbance 10/19/2016  . Acute metabolic encephalopathy 10/18/2016  . Palliative care encounter   . Goals of care, counseling/discussion   . DNR (do not resuscitate) discussion   . Sepsis due to methicillin resistant Staphylococcus aureus (MRSA) (HCC) 10/17/2016  . Abnormal ECG 08/23/2012  . Essential hypertension, benign 12/11/2008  . PREMATURE ATRIAL CONTRACTIONS 12/11/2008  . CARDIAC MURMUR 12/11/2008    Orientation RESPIRATION BLADDER Height & Weight     Self  Normal Continent Weight: 130 lb 14.4 oz (59.4 kg) Height:  5\' 6"  (167.6 cm)  BEHAVIORAL SYMPTOMS/MOOD NEUROLOGICAL BOWEL NUTRITION STATUS      Continent Diet (DYS 1)  AMBULATORY STATUS COMMUNICATION OF NEEDS Skin   Limited Assist Verbally Normal                       Personal Care  Assistance Level of Assistance  Bathing, Feeding, Dressing Bathing Assistance: Limited assistance Feeding assistance: Independent Dressing Assistance: Limited assistance     Functional Limitations Info  Sight, Hearing, Speech Sight Info: Adequate Hearing Info: Adequate Speech Info: Adequate    SPECIAL CARE FACTORS FREQUENCY  Speech therapy, PT (By licensed PT)     PT Frequency: PT eval pending.               Contractures Contractures Info: Not present    Additional Factors Info  Code Status, Allergies, Isolation Precautions Code Status Info: Full Code Allergies Info: Dristan Cold, Sulfonamide Derivatives      Isolation Precautions Info: MRSA + Blood cultures on 10/17/2016     Current Medications (10/25/2016):  This is the current hospital active medication list Current Facility-Administered Medications  Medication Dose Route Frequency Provider Last Rate Last Dose  . 0.9 %  sodium chloride infusion   Intravenous Continuous Eduard Clos, MD 10 mL/hr at 10/23/16 2128    . acetaminophen (TYLENOL) tablet 650 mg  650 mg Oral Q6H PRN Eduard Clos, MD   650 mg at 10/25/16 8469   Or  . acetaminophen (TYLENOL) suppository 650 mg  650 mg Rectal Q6H PRN Eduard Clos, MD      . acetylcysteine (MUCOMYST) 20 % nebulizer / oral solution 4 mL  4 mL Nebulization TID Catarina Hartshorn, MD   4 mL at 10/25/16 6295  . amoxicillin-clavulanate (AUGMENTIN) 875-125 MG per tablet 1 tablet  1 tablet Oral  Pablo LedgerQ12H Tat, David, MD      . aspirin EC tablet 81 mg  81 mg Oral Daily Eduard ClosKakrakandy, Arshad N, MD   81 mg at 10/25/16 0816  . chlorhexidine (PERIDEX) 0.12 % solution 15 mL  15 mL Mouth Rinse BID Eduard ClosKakrakandy, Arshad N, MD   15 mL at 10/24/16 2226  . levalbuterol (XOPENEX) nebulizer solution 1.25 mg  1.25 mg Nebulization TID Catarina Hartshornat, David, MD   1.25 mg at 10/25/16 [id number]  . magnesium sulfate 2 g in dextrose 5 % 100 mL IVPB  2 g Intravenous Once Tat, David, MD      . MEDLINE mouth rinse  15 mL  Mouth Rinse q12n4p Eduard ClosKakrakandy, Arshad N, MD   15 mL at 10/24/16 1200  . potassium chloride SA (K-DUR,KLOR-CON) CR tablet 40 mEq  40 mEq Oral Daily Tat, Onalee Huaavid, MD   40 mEq at 10/25/16 0816  . trimethoprim-polymyxin b (POLYTRIM) ophthalmic solution 1 drop  1 drop Right Eye Colman CaterQ4H Tat, David, MD   1 drop at 10/25/16 0815  . vancomycin (VANCOCIN) IVPB 1000 mg/200 mL premix  1,000 mg Intravenous Q24H Philip AspenHernandez Acosta, Limmie PatriciaEstela Y, MD   Stopped at 10/24/16 1148  . vitamin B-12 (CYANOCOBALAMIN) tablet 500 mcg  500 mcg Oral Daily Tat, David, MD   500 mcg at 10/25/16 0815     Discharge Medications: Please see discharge summary for a list of discharge medications.  Relevant Imaging Results:  Relevant Lab Results:   Additional Information SSN [id number]. Patient will need six weeks of IV vancomycin. Patient has a PICC line.   Deveon Kisiel, Juleen ChinaHeather D, LCSW

## 2016-10-25 NOTE — Progress Notes (Signed)
LCSW following for disposition: new placement  [facility] [place]: Still pending insurance authorization. Attempted to reach director and medical director to discuss LOG, however unable to reach and messages left. Will follow up on 7/25 regarding discharge and insurance.  Deretha EmoryHannah Marchello Rothgeb LCSW, MSW Clinical Social Work: Optician, dispensingystem Wide Float Coverage for :  [phone]

## 2016-10-25 NOTE — Clinical Social Work Placement (Signed)
   CLINICAL SOCIAL WORK PLACEMENT  NOTE  Date:  10/25/2016  Patient Details  Name: Jon Cross MRN: [id number] Date of Birth: 1932-10-31  Clinical Social Work is seeking post-discharge placement for this patient at the Skilled  Nursing Facility level of care (*CSW will initial, date and re-position this form in  chart as items are completed):  Yes   Patient/family provided with [place] Clinical Social Work Department's list of facilities offering this level of care within the geographic area requested by the patient (or if unable, by the patient's family).  Yes   Patient/family informed of their freedom to choose among providers that offer the needed level of care, that participate in Medicare, Medicaid or managed care program needed by the patient, have an available bed and are willing to accept the patient.  Yes   Patient/family informed of [facility]'s ownership interest in [facility] and [facility], as well as of the fact that they are under no obligation to receive care at these facilities.  PASRR submitted to EDS on 10/25/16     PASRR number received on 10/25/16     Existing PASRR number confirmed on       FL2 transmitted to all facilities in geographic area requested by pt/family on 10/25/16     FL2 transmitted to all facilities within larger geographic area on       Patient informed that his/her managed care company has contracts with or will negotiate with certain facilities, including the following:        Yes   Patient/family informed of bed offers received.  [facility] [place]     Physician recommends and patient chooses bed at      [facility] 10/25/16.  Patient to be transferred to facility by [facility]     Patient family notified on 10/25/16 of transfer.  Name of family member notified:  Angela Nevinarrell Dyal, son     PHYSICIAN       Additional Comment:     _______________________________________________ Annice NeedySettle, [name], LCSW 10/25/2016, 12:39 PM

## 2016-10-25 NOTE — Clinical Social Work Placement (Addendum)
   CLINICAL SOCIAL WORK PLACEMENT  NOTE  Date:  10/25/2016  Patient Details  Name: Jon CraneGeorge W Polzin MRN: [id number] Date of Birth: 03-29-1933  Clinical Social Work is seeking post-discharge placement for this patient at the Skilled  Nursing Facility level of care (*CSW will initial, date and re-position this form in  chart as items are completed):  Yes   Patient/family provided with [place] Clinical Social Work Department's list of facilities offering this level of care within the geographic area requested by the patient (or if unable, by the patient's family).  Yes   Patient/family informed of their freedom to choose among providers that offer the needed level of care, that participate in Medicare, Medicaid or managed care program needed by the patient, have an available bed and are willing to accept the patient.  Yes   Patient/family informed of [facility]'s ownership interest in [facility] and [facility], as well as of the fact that they are under no obligation to receive care at these facilities.  PASRR submitted to EDS on 10/25/16     PASRR number received on 10/25/16     Existing PASRR number confirmed on       FL2 transmitted to all facilities in geographic area requested by pt/family on 10/25/16     FL2 transmitted to all facilities within larger geographic area on       Patient informed that his/her managed care company has contracts with or will negotiate with certain facilities, including the following:            Patient/family informed of bed offers received.  [facility].     Physician recommends and patient chooses bed at      [facility] [place] [facility] 10/26/16.  Patient to be transferred to facility by [facility]     Patient family notified on 10/26/16 of transfer.  Name of family member notified:  Darell     PHYSICIAN       Additional Comment:     _______________________________________________ Annice NeedySettle, Heather D, LCSW 10/25/2016, 11:42 AM

## 2016-10-25 NOTE — Care Management Important Message (Signed)
Important Message  Patient Details  Name: Jon Cross MRN: [id number] Date of Birth: 12-30-1932   Medicare Important Message Given:  Yes    Malcolm MetroChildress, Hutton Pellicane Demske, RN 10/25/2016, 1:51 PM

## 2016-10-25 NOTE — Care Management Note (Signed)
Case Management Note  Patient Details  Name: Jon Cross MRN: [id number] Date of Birth: 05-19-1932  Subjective/Objective:                  Pt admitted with sepsis. He is from home. PT has recommended SNF. Pt agreeable.   Action/Plan: Pt discharging today. CSW has made placement arrangements. No CM needs.   Expected Discharge Date:  10/25/16               Expected Discharge Plan:  Home/Self Care  In-House Referral:  Clinical Social Work  Discharge planning Services  CM Consult  Post Acute Care Choice:  NA Choice offered to:  NA  Status of Service:  Completed, signed off  If discussed at Long Length of Stay Meetings, dates discussed:  10/25/2016   Malcolm MetroChildress, Kylin Genna Demske, RN 10/25/2016, 1:49 PM

## 2016-10-25 NOTE — Progress Notes (Signed)
Subjective: He says he feels about the same. No new complaints. He had CT done yesterday that I personally reviewed and it really does not show mucus plugging. No indication for bronchoscopy  Objective: Vital signs in last 24 hours: Temp:  [97.9 F (36.6 C)-98.9 F (37.2 C)] 97.9 F (36.6 C) (07/24 0517) Pulse Rate:  [74-78] 78 (07/24 0517) Resp:  [16] 16 (07/24 0517) BP: (125-141)/(65-72) 125/66 (07/24 0517) SpO2:  [86 %-96 %] 92 % (07/24 0816) Weight:  [59.4 kg (130 lb 14.4 oz)] 59.4 kg (130 lb 14.4 oz) (07/24 0517) Weight change: -1.315 kg (-2 lb 14.4 oz) Last BM Date: 10/24/16  Intake/Output from previous day: 07/23 0701 - 07/24 0700 In: 880 [P.O.:480; I.V.:100; IV Piggyback:300] Out: 3000 [Urine:3000]  PHYSICAL EXAM General appearance: alert, cooperative and no distress Resp: rhonchi bilaterally Cardio: regular rate and rhythm, S1, S2 normal, no murmur, click, rub or gallop GI: soft, non-tender; bowel sounds normal; no masses,  no organomegaly Extremities: extremities normal, atraumatic, no cyanosis or edema Skin warm and dry mildly confused  Lab Results:  Results for orders placed or performed during the hospital encounter of 10/16/16 (from the past 48 hour(s))  Culture, expectorated sputum-assessment     Status: None   Collection Time: 10/23/16  8:45 AM  Result Value Ref Range   Specimen Description SPUTUM    Special Requests Normal    Sputum evaluation THIS SPECIMEN IS ACCEPTABLE FOR SPUTUM CULTURE    Report Status 10/23/2016 FINAL   Culture, respiratory (NON-Expectorated)     Status: None (Preliminary result)   Collection Time: 10/23/16  8:45 AM  Result Value Ref Range   Specimen Description SPUTUM    Special Requests Normal Reflexed from [id number]    Gram Stain      NO WBC SEEN RARE SQUAMOUS EPITHELIAL CELLS PRESENT NO ORGANISMS SEEN Performed at [facility], [address] [place], [place] [address]    Culture PENDING    Report Status PENDING    Basic metabolic panel     Status: Abnormal   Collection Time: 10/24/16  7:54 AM  Result Value Ref Range   Sodium 140 135 - 145 mmol/L   Potassium 3.2 (L) 3.5 - 5.1 mmol/L   Chloride 100 (L) 101 - 111 mmol/L   CO2 30 22 - 32 mmol/L   Glucose, Bld 100 (H) 65 - 99 mg/dL   BUN 19 6 - 20 mg/dL   Creatinine, Ser 1.13 0.61 - 1.24 mg/dL   Calcium 8.3 (L) 8.9 - 10.3 mg/dL   GFR calc non Af Amer 59 (L) >60 mL/min   GFR calc Af Amer >60 >60 mL/min    Comment: (NOTE) The eGFR has been calculated using the CKD EPI equation. This calculation has not been validated in all clinical situations. eGFR's persistently <60 mL/min signify possible Chronic Kidney Disease.    Anion gap 10 5 - 15  Basic metabolic panel     Status: Abnormal   Collection Time: 10/25/16  4:38 AM  Result Value Ref Range   Sodium 141 135 - 145 mmol/L   Potassium 3.2 (L) 3.5 - 5.1 mmol/L   Chloride 98 (L) 101 - 111 mmol/L   CO2 35 (H) 22 - 32 mmol/L   Glucose, Bld 85 65 - 99 mg/dL   BUN 19 6 - 20 mg/dL   Creatinine, Ser 1.31 (H) 0.61 - 1.24 mg/dL   Calcium 8.3 (L) 8.9 - 10.3 mg/dL   GFR calc non Af Amer 49 (  L) >60 mL/min   GFR calc Af Amer 57 (L) >60 mL/min    Comment: (NOTE) The eGFR has been calculated using the CKD EPI equation. This calculation has not been validated in all clinical situations. eGFR's persistently <60 mL/min signify possible Chronic Kidney Disease.    Anion gap 8 5 - 15  Magnesium     Status: None   Collection Time: 10/25/16  4:38 AM  Result Value Ref Range   Magnesium 1.9 1.7 - 2.4 mg/dL    ABGS No results for input(s): PHART, PO2ART, TCO2, HCO3 in the last 72 hours.  Invalid input(s): PCO2 CULTURES Recent Results (from the past 240 hour(s))  Urine culture     Status: None   Collection Time: 10/16/16 11:13 PM  Result Value Ref Range Status   Specimen Description URINE, CLEAN CATCH  Final   Special Requests NONE  Final   Culture   Final    NO GROWTH Performed at [facility], [address] [place], [place] [address]    Report Status 10/18/2016 FINAL  Final  Blood Culture (routine x 2)     Status: Abnormal   Collection Time: 10/17/16 12:37 AM  Result Value Ref Range Status   Specimen Description BLOOD RIGHT ARM  Final   Special Requests   Final    BOTTLES DRAWN AEROBIC AND ANAEROBIC Blood Culture adequate volume   Culture  Setup Time   Final    GRAM POSITIVE COCCI Gram Stain Report Called to,Read Back By and Verified With: BIVENS,L @ 9166 ON 7.16.18 BY BOWMAN,L IN BOTH AEROBIC AND ANAEROBIC BOTTLES    Culture METHICILLIN RESISTANT STAPHYLOCOCCUS AUREUS (A)  Final   Report Status 10/19/2016 FINAL  Final   Organism ID, Bacteria METHICILLIN RESISTANT STAPHYLOCOCCUS AUREUS  Final      Susceptibility   Methicillin resistant staphylococcus aureus - MIC*    CIPROFLOXACIN <=0.5 SENSITIVE Sensitive     ERYTHROMYCIN >=8 RESISTANT Resistant     GENTAMICIN <=0.5 SENSITIVE Sensitive     OXACILLIN >=4 RESISTANT Resistant     TETRACYCLINE <=1 SENSITIVE Sensitive     VANCOMYCIN 1 SENSITIVE Sensitive     TRIMETH/SULFA <=10 SENSITIVE Sensitive     CLINDAMYCIN <=0.25 SENSITIVE Sensitive     RIFAMPIN <=0.5 SENSITIVE Sensitive     Inducible Clindamycin NEGATIVE Sensitive     * METHICILLIN RESISTANT STAPHYLOCOCCUS AUREUS  Blood Culture ID Panel (Reflexed)     Status: Abnormal   Collection Time: 10/17/16 12:37 AM  Result Value Ref Range Status   Enterococcus species NOT DETECTED NOT DETECTED Final   Listeria monocytogenes NOT DETECTED NOT DETECTED Final   Staphylococcus species DETECTED (A) NOT DETECTED Final    Comment: CRITICAL RESULT CALLED TO, READ BACK BY AND VERIFIED WITH: Lovena Neighbours RN, AT 1844 10/17/16 BY D. VANHOOK    Staphylococcus aureus DETECTED (A) NOT DETECTED Final    Comment: Methicillin (oxacillin)-resistant Staphylococcus aureus (MRSA). MRSA is predictably resistant to beta-lactam antibiotics (except ceftaroline). Preferred therapy is vancomycin unless  clinically contraindicated. Patient requires contact precautions if  hospitalized. CRITICAL RESULT CALLED TO, READ BACK BY AND VERIFIED WITH: L. BIVENS RN, AT 0600 10/17/16 BY D. VANHOOK    Methicillin resistance DETECTED (A) NOT DETECTED Final    Comment: CRITICAL RESULT CALLED TO, READ BACK BY AND VERIFIED WITH: Lovena Neighbours RN, AT 4599 10/17/16 BY D. VANHOOK    Streptococcus species NOT DETECTED NOT DETECTED Final   Streptococcus agalactiae NOT DETECTED NOT DETECTED Final  Streptococcus pneumoniae NOT DETECTED NOT DETECTED Final   Streptococcus pyogenes NOT DETECTED NOT DETECTED Final   Acinetobacter baumannii NOT DETECTED NOT DETECTED Final   Enterobacteriaceae species NOT DETECTED NOT DETECTED Final   Enterobacter cloacae complex NOT DETECTED NOT DETECTED Final   Escherichia coli NOT DETECTED NOT DETECTED Final   Klebsiella oxytoca NOT DETECTED NOT DETECTED Final   Klebsiella pneumoniae NOT DETECTED NOT DETECTED Final   Proteus species NOT DETECTED NOT DETECTED Final   Serratia marcescens NOT DETECTED NOT DETECTED Final   Haemophilus influenzae NOT DETECTED NOT DETECTED Final   Neisseria meningitidis NOT DETECTED NOT DETECTED Final   Pseudomonas aeruginosa NOT DETECTED NOT DETECTED Final   Candida albicans NOT DETECTED NOT DETECTED Final   Candida glabrata NOT DETECTED NOT DETECTED Final   Candida krusei NOT DETECTED NOT DETECTED Final   Candida parapsilosis NOT DETECTED NOT DETECTED Final   Candida tropicalis NOT DETECTED NOT DETECTED Final    Comment: Performed at [facility], [place] [address], [place], [place] [address]  Blood Culture (routine x 2)     Status: Abnormal   Collection Time: 10/17/16 12:46 AM  Result Value Ref Range Status   Specimen Description BLOOD LEFT ARM  Final   Special Requests   Final    BOTTLES DRAWN AEROBIC ONLY Blood Culture results may not be optimal due to an inadequate volume of blood received in culture bottles   Culture  Setup Time   Final     GRAM POSITIVE COCCI Gram Stain Report Called to,Read Back By and Verified With: BIVENS,L @ 2979 ON 7.16.18 BY BOWMAN,L AEROBIC BOTTLE ONLY    Culture (A)  Final    STAPHYLOCOCCUS AUREUS SUSCEPTIBILITIES PERFORMED ON PREVIOUS CULTURE WITHIN THE LAST 5 DAYS. Performed at [facility], [place] [address], [place], [place] [address]    Report Status 10/19/2016 FINAL  Final  Gram stain     Status: None   Collection Time: 10/18/16 10:34 AM  Result Value Ref Range Status   Specimen Description CSF  Final   Special Requests NONE  Final   Gram Stain   Final    CYTOSPIN SMEAR WBC SEEN WBC PRESENT,BOTH PMN AND MONONUCLEAR NO ORGANISMS SEEN   Report Status 10/18/2016 FINAL  Final  CSF culture     Status: None   Collection Time: 10/18/16 10:34 AM  Result Value Ref Range Status   Specimen Description CSF  Final   Special Requests NONE  Final   Gram Stain   Final    CYTOSPIN SMEAR NO ORGANISMS SEEN WBC SEEN WBC PRESENT,BOTH PMN AND MONONUCLEAR    Culture   Final    NO GROWTH 3 DAYS Performed at [facility], [place] [address] [place], [place] [address]    Report Status 10/22/2016 FINAL  Final  Culture, blood (Routine X 2) w Reflex to ID Panel     Status: None   Collection Time: 10/19/16  6:41 AM  Result Value Ref Range Status   Specimen Description BLOOD RIGHT ARM  Final   Special Requests Blood Culture adequate volume  Final   Culture NO GROWTH 5 DAYS  Final   Report Status 10/24/2016 FINAL  Final  Culture, blood (Routine X 2) w Reflex to ID Panel     Status: None   Collection Time: 10/19/16  6:52 AM  Result Value Ref Range Status   Specimen Description BLOOD RIGHT HAND  Final   Special Requests Blood Culture adequate volume  Final   Culture NO GROWTH 5 DAYS  Final   Report Status 10/24/2016 FINAL  Final  Culture, sputum-assessment     Status: None   Collection Time: 10/22/16  4:00 PM  Result Value Ref Range Status   Specimen Description EXPECTORATED SPUTUM  Final    Special Requests NONE  Final   Sputum evaluation   Final    Sputum specimen not acceptable for testing.  Please recollect.   RESULT CALLED TO, READ BACK BY AND VERIFIED WITH: GARZON,S. AT 1836 ON07/21/2018 BY EVA   Report Status 10/22/2016 FINAL  Final  Culture, expectorated sputum-assessment     Status: None   Collection Time: 10/23/16  8:45 AM  Result Value Ref Range Status   Specimen Description SPUTUM  Final   Special Requests Normal  Final   Sputum evaluation THIS SPECIMEN IS ACCEPTABLE FOR SPUTUM CULTURE  Final   Report Status 10/23/2016 FINAL  Final  Culture, respiratory (NON-Expectorated)     Status: None (Preliminary result)   Collection Time: 10/23/16  8:45 AM  Result Value Ref Range Status   Specimen Description SPUTUM  Final   Special Requests Normal Reflexed from [id number]  Final   Gram Stain   Final    NO WBC SEEN RARE SQUAMOUS EPITHELIAL CELLS PRESENT NO ORGANISMS SEEN Performed at [facility], [address], [place], [place] [address]    Culture PENDING  Incomplete   Report Status PENDING  Incomplete   Studies/Results: Ct Chest W Contrast  Result Date: 10/24/2016 CLINICAL DATA:  Productive cough, fever.  Atelectasis. EXAM: CT CHEST WITH CONTRAST TECHNIQUE: Multidetector CT imaging of the chest was performed during intravenous contrast administration. CONTRAST:  25m ISOVUE-300 IOPAMIDOL (ISOVUE-300) INJECTION 61% COMPARISON:  Radiograph of October 22, 2016. FINDINGS: Cardiovascular: There is no evidence of thoracic aortic dissection or aneurysm. Aortic atherosclerosis is noted. Coronary artery calcifications are noted. No pericardial effusion is noted. Mediastinum/Nodes: Mildly enlarged mediastinal adenopathy is noted with 1.5 cm lymph node seen in subcarinal region 14 mm right peritracheal lymph node is noted. 8 mm lymph node is noted in aortopulmonary window. Lungs/Pleura: No pneumothorax is noted. Airspace opacities are noted in both upper lobes concerning for  pneumonia. Left lower lobe atelectasis or pneumonia is noted as well. Moderate bilateral pleural effusions are noted. Upper Abdomen: No acute abnormality. Musculoskeletal: No chest wall abnormality. No acute or significant osseous findings. IMPRESSION: Coronary artery calcifications are noted suggesting coronary artery disease. Bilateral pulmonary airspace opacities are noted concerning for multifocal pneumonia. Moderate bilateral pleural effusions are noted. Mildly enlarged mediastinal adenopathy is noted which most likely is inflammatory or reactive in etiology, but neoplasm cannot be excluded. Clinical correlation is recommended. Aortic Atherosclerosis (ICD10-I70.0). Electronically Signed   By: JMarijo Conception M.D.   On: 10/24/2016 09:47   Dg Chest Port 1 View  Result Date: 10/24/2016 CLINICAL DATA:  Status post PICC line placement EXAM: PORTABLE CHEST 1 VIEW COMPARISON:  10/22/2016 FINDINGS: Cardiac shadow is enlarged. Significant improved aeration on the left is noted when compare with the prior exam. Some residual effusion is noted. Patchy infiltrative changes are noted bilaterally. The PICC line is now seen with the catheter tip at the cavoatrial junction. No acute bony abnormality is noted. IMPRESSION: Multifocal pneumonia and left-sided effusion. Status post PICC line placement in satisfactory position. Electronically Signed   By: MInez CatalinaM.D.   On: 10/24/2016 15:35    Medications:  Prior to Admission:  Prescriptions Prior to Admission  Medication Sig  Dispense Refill Last Dose  . memantine (NAMENDA) 5 MG tablet Take 5 mg by mouth daily.     Marland Kitchen acetaminophen (TYLENOL) 500 MG tablet Take 500 mg by mouth every 6 (six) hours as needed.     Marland Kitchen aspirin EC 81 MG tablet Take 81 mg by mouth daily.   Taking   Scheduled: . acetylcysteine  4 mL Nebulization TID  . aspirin EC  81 mg Oral Daily  . chlorhexidine  15 mL Mouth Rinse BID  . levalbuterol  1.25 mg Nebulization TID  . mouth rinse  15 mL  Mouth Rinse q12n4p  . potassium chloride  40 mEq Oral Daily  . trimethoprim-polymyxin b  1 drop Right Eye Q4H  . vitamin B-12  500 mcg Oral Daily   Continuous: . sodium chloride 10 mL/hr at 10/23/16 2128  . ampicillin-sulbactam (UNASYN) IV Stopped (10/25/16 0711)  . vancomycin Stopped (10/24/16 1148)   YHT:[id number] **OR** acetaminophen  Assesment:He came in with sepsis due to methicillin resistant staph. He had MRSA bacteremia and he has endocarditis. He aspirated and has bilateral infiltrates and there was concern that he might have atelectasis from mucous plugging but that did not show on CT which I as mentioned personally reviewed. He does not need a bronchoscopy. He looks better than would be expected from his chest x-ray looks comfortable requiring only fairly lowAmount of oxygen and he is coughing up copious amounts of sputum now so I think this will clear without invasive procedure Principal Problem:   Sepsis due to methicillin resistant Staphylococcus aureus (MRSA) ([place]) Active Problems:   Essential hypertension, benign   Palliative care encounter   Goals of care, counseling/discussion   DNR (do not resuscitate) discussion   Acute metabolic encephalopathy   MRSA bacteremia   Acute renal failure superimposed on stage 3 chronic kidney disease ([employer])   Dementia without behavioral disturbance   Endocarditis of native valve   Acute respiratory failure with hypoxia ([place])   Aspiration pneumonia of both lower lobes due to gastric secretions ([facility])   Aspiration pneumonia of both upper lobes due to gastric secretions ([place])    Plan: Continue current treatments    LOS: 8 days   Trey Gulbranson L 10/25/2016, 8:36 AM

## 2016-10-25 NOTE — Clinical Social Work Note (Signed)
Clinical Social Work Assessment  Patient Details  Name: Jon Cross MRN: [id number] Date of Birth: [birth year]  Date of referral:  10/25/16               Reason for consult:  Discharge Planning                Permission sought to share information with:    Permission granted to share information::     Name::        Agency::     Relationship::     Contact Information:     Housing/Transportation Living arrangements for the past 2 months:  Single Family Home Source of Information:  Adult Children Patient Interpreter Needed:  None Criminal Activity/Legal Involvement Pertinent to Current Situation/Hospitalization:  No - Comment as needed Significant Relationships:  Adult Children Lives with:  Other (Comment) (lives with sister in law) Do you feel safe going back to the place where you live?  Yes Need for family participation in patient care:  Yes (Comment)  Care giving concerns: None identified at baseline.    Social Worker assessment / plan:  Patient resides with sister in Social workerlaw. At baseline patient is ambulatory and independent in ADLs. Family member check on him and his sister in law frequently as they live near by. Patient will need six week of IV antibiotics and patient's son is agreeable for [facility] to be [facility].   Employment status:  Retired Database administratornsurance information:  Managed Medicare PT Recommendations:    Information / Referral to community resources:     Patient/Family's Response to care:  Son is agreeable to SNF.  Patient/Family's Understanding of and Emotional Response to Diagnosis, Current Treatment, and Prognosis:  Family understands patient's diagnosis, treatment and prognosis.   Emotional Assessment Appearance:    Attitude/Demeanor/Rapport:    Affect (typically observed):    Orientation:  Oriented to Self Alcohol / Substance use:  Not Applicable Psych involvement (Current and /or in the community):      Discharge Needs  Concerns to be addressed:  Discharge Planning Concerns Readmission within the last 30 days:  No Current discharge risk:  None Barriers to Discharge:  Insurance Authorization   Annice NeedySettle, Amyah Clawson D, LCSW 10/25/2016, 11:39 AM

## 2016-10-25 NOTE — Evaluation (Signed)
Physical Therapy Evaluation Patient Details Name: Jon Cross MRN: [id number] DOB: 28-Sep-1932 Today's Date: 10/25/2016   History of Present Illness  Jon Cross is an 83yo white male who comes to [facility] on 7/16 with weakness and AMS. He is febrile, eventually found to have sepsis from bactremia/MRSA, with endocarditis involvement, and later in his stay asp PNA. Pt is now prepared for DC to SNF for IV ABX. At baseline patient was living with his significant other, still driving, modified indep limited community ambulation with SPC , indep in ADL. Family was involved in advanced ADL/IADL but to what extent is not clear at this time as no family is available at time of eval.   Clinical Impression  Pt admitted with above diagnosis. Pt currently with functional limitations due to the deficits listed below (see "PT Problem List"). Significant balance impairment with high falls risk AEB Berg Balance Test: 25/56; significant weakness AEB need for physical assistance for STS transfers and minA to recover LOB during amb. Amb tolerance is <34ft at this time, whereas the patient was amb limited community distances c SPC PTA. VSS during visit on room air throughout. Pt will benefit from skilled PT intervention to increase independence and safety with basic mobility in preparation for discharge to the venue listed below.       Follow Up Recommendations SNF;Supervision/Assistance - 24 hour    Equipment Recommendations  None recommended by PT (TBD by receiving facility, will need RW level of stability inititally )    Recommendations for Other Services       Precautions / Restrictions Precautions Precautions: Fall      Mobility  Bed Mobility Overal bed mobility: Needs Assistance Bed Mobility: Supine to Sit     Supine to sit: Supervision     General bed mobility comments: heavy effort required and additional time.   Transfers Overall transfer level: Needs assistance Equipment used:  None Transfers: Sit to/from Stand Sit to Stand: Min assist         General transfer comment: double hand held assist provided, able to perform 5xSTS over 60 seconds c breaks as needed.   Ambulation/Gait Ambulation/Gait assistance: Min assist     Gait Pattern/deviations: Decreased step length - left;Decreased step length - right;Step-to pattern;Decreased weight shift to left;Decreased weight shift to right     General Gait Details: very weak, heavily dependent on BUE support, using IV pole and other hand intermittently on wall/counter/etc. Dorene Grebe to recover LOB x3  Stairs            Wheelchair Mobility    Modified Rankin (Stroke Patients Only)       Balance                                 Standardized Balance Assessment Standardized Balance Assessment : Berg Balance Test Berg Balance Test Sit to Stand: Able to stand  independently using hands Standing Unsupported: Able to stand 2 minutes with supervision Sitting with Back Unsupported but Feet Supported on Floor or Stool: Able to sit safely and securely 2 minutes Stand to Sit: Controls descent by using hands Transfers: Able to transfer with verbal cueing and /or supervision Standing Unsupported with Eyes Closed: Able to stand 3 seconds Standing Ubsupported with Feet Together: Able to place feet together independently and stand 1 minute safely From Standing, Reach Forward with Outstretched Arm: Can reach forward >5 cm safely (2") From Standing Position, Pick up Object  from Floor: Unable to pick up and needs supervision From Standing Position, Turn to Look Behind Over each Shoulder: Needs supervision when turning Turn 360 Degrees: Needs assistance while turning Standing Unsupported, Alternately Place Feet on Step/Stool: Needs assistance to keep from falling or unable to try Standing Unsupported, One Foot in Front: Loses balance while stepping or standing Standing on One Leg: Unable to try or needs assist to  prevent fall Total Score: 25         Pertinent Vitals/Pain Pain Assessment: No/denies pain    Home Living Family/patient expects to be discharged to:: Private residence Living Arrangements: Spouse/significant other Available Help at Discharge: Family Type of Home: House Home Access: Level entry     Home Layout: One level Home Equipment: Cane - single point      Prior Function Level of Independence: Needs assistance;Independent with assistive device(s)   Gait / Transfers Assistance Needed: limited community amb with SPC, reportedly very active  ADL's / Homemaking Assistance Needed: independent in ADL; niece and her husband were helping with groceries and house work         Higher education careers adviserHand Dominance        Extremity/Trunk Assessment   Upper Extremity Assessment Upper Extremity Assessment: Generalized weakness;[facility] tasks assessed    Lower Extremity Assessment Lower Extremity Assessment: Generalized weakness;Overall WFL for tasks assessed       Communication   Communication: HOH  Cognition Arousal/Alertness: Awake/alert Behavior During Therapy: WFL for tasks assessed/performed Overall Cognitive Status: Impaired/Different from baseline Area of Impairment: Memory;Orientation                 Orientation Level: Person   Memory: Decreased short-term memory;Decreased recall of precautions                General Comments      Exercises     Assessment/Plan    PT Assessment Patient needs continued PT services  PT Problem List Decreased strength;Decreased cognition;Decreased activity tolerance;Decreased safety awareness;Decreased knowledge of use of DME;Decreased balance;Decreased mobility       PT Treatment Interventions DME instruction;Gait training;Stair training;Functional mobility training;Therapeutic activities;Therapeutic exercise;Balance training;Cognitive remediation;Patient/family education    PT Goals (Current goals can be found in the  Care Plan section)  Acute Rehab PT Goals Patient Stated Goal: regain strength and mobility  PT Goal Formulation: With patient Time For Goal Achievement: 11/08/16 Potential to Achieve Goals: Good    Frequency Min 2X/week   Barriers to discharge        Co-evaluation               AM-PAC PT "6 Clicks" Daily Activity  Outcome Measure Difficulty turning over in bed (including adjusting bedclothes, sheets and blankets)?: A Lot Difficulty moving from lying on back to sitting on the side of the bed? : A Lot Difficulty sitting down on and standing up from a chair with arms (e.g., wheelchair, bedside commode, etc,.)?: Total Help needed moving to and from a bed to chair (including a wheelchair)?: Total Help needed walking in hospital room?: Total Help needed climbing 3-5 steps with a railing? : Total 6 Click Score: 8    End of Session Equipment Utilized During Treatment: Gait belt Activity Tolerance: Patient tolerated treatment well;Patient limited by fatigue Patient left: in bed;with bed alarm set;with call bell/phone within reach (chair position) Nurse Communication: Mobility status PT Visit Diagnosis: Unsteadiness on feet (R26.81);Muscle weakness (generalized) (M62.81)    Time: [id number] PT Time Calculation (min) (ACUTE ONLY): 27 min  Charges:   PT Evaluation $PT Eval Moderate Complexity: 1 Procedure PT Treatments $Therapeutic Activity: 8-22 mins   PT G Codes:        4:50 PM, 10/26/16 Rosamaria Lints, PT, DPT Physical Therapist - [place] [phone] (Office)   Neno Hohensee C 10-26-16, 4:48 PM

## 2016-10-25 NOTE — Progress Notes (Signed)
Daily Progress Note   Patient Name: Jon Cross       Date: 10/25/2016 DOB: 12/26/1932  Age: 83 y.o. MRN#: [id number] Attending Physician: Jon Cross, David, MD Primary Care Physician: Jon Cross, Leonard, MD Admit Date: 10/16/2016  Reason for Consultation/Follow-up: Establishing goals of care and Psychosocial/spiritual support  Subjective: Jon Cross is resting quietly in bed. He greets me making but not keeping eye contact as I enter. He is calm and cooperative, but pleasantly confused. There is no family of bedside. Son/HCP away, Jon Cross is in [place] visiting his daughter, Jon Cross wife is also struggling with brain cancer.  Jon Cross looks improved, he is more clear in his thinking and communication, and has less of a cough today. He takes a sip of soda without overt signs and symptoms of aspiration. He has no questions or concerns.  No call to [name] today.   Length of Stay: 8  Current Medications: Scheduled Meds:  . acetylcysteine  4 mL Nebulization TID  . amoxicillin-clavulanate  1 tablet Oral Q12H  . aspirin EC  81 mg Oral Daily  . chlorhexidine  15 mL Mouth Rinse BID  . levalbuterol  1.25 mg Nebulization TID  . mouth rinse  15 mL Mouth Rinse q12n4p  . potassium chloride  40 mEq Oral Daily  . trimethoprim-polymyxin b  1 drop Right Eye Q4H  . vitamin B-12  500 mcg Oral Daily    Continuous Infusions: . sodium chloride Stopped (10/25/16 1303)  . magnesium sulfate 1 - 4 g bolus IVPB 2 g (10/25/16 1303)  . [START ON 10/26/2016] vancomycin    . vancomycin 1,000 mg (10/25/16 1303)    PRN Meds: acetaminophen **OR** acetaminophen  Physical Exam  Constitutional: No distress.  HENT:  Head: Atraumatic.  Right eye continues red but not as swollen  Cardiovascular:  Normal rate and regular rhythm.   Pulmonary/Chest: Effort normal. No respiratory distress.  Decreased cough today  Abdominal: Soft. He exhibits no distension.  Musculoskeletal: He exhibits no edema.  Neurological: He is alert.  Skin: Skin is warm and dry.  Nursing note and vitals reviewed.           Vital Signs: BP 125/63 (BP Location: Left Arm)   Pulse 72   Temp 97.7 F (36.5 C) (Oral)   Resp 18   Ht  5\' 6"  (1.676 m)   Wt 59.4 kg (130 lb 14.4 oz)   SpO2 98%   BMI 21.13 kg/m  SpO2: SpO2: 98 % O2 Device: O2 Device: Not Delivered O2 Flow Rate: O2 Flow Rate (L/min): 3 L/min  Intake/output summary:  Intake/Output Summary (Last 24 hours) at 10/25/16 1500 Last data filed at 10/25/16 1303  Gross per 24 hour  Intake            644.5 ml  Output             2050 ml  Net          -1405.5 ml   LBM: Last BM Date: 10/24/16 Baseline Weight: Weight: 65.8 kg (145 lb) Most recent weight: Weight: 59.4 kg (130 lb 14.4 oz)       Palliative Assessment/Data:    Flowsheet Rows     Most Recent Value  Intake Tab  Referral Department  Hospitalist  Unit at Time of Referral  Med/Surg Unit  Palliative Care Primary Diagnosis  Sepsis/Infectious Disease  Date Notified  10/17/16  Palliative Care Type  New Palliative care  Reason for referral  Clarify Goals of Care  Date of Admission  10/16/16  Date first seen by Palliative Care  10/18/16  # of days Palliative referral response time  1 Day(s)  # of days IP prior to Palliative referral  1  Clinical Assessment  Palliative Performance Scale Score  20%  Pain Max last 24 hours  Not able to report  Pain Min Last 24 hours  Not able to report  Dyspnea Max Last 24 Hours  Not able to report  Dyspnea Min Last 24 hours  Not able to report  Psychosocial & Spiritual Assessment  Palliative Care Outcomes  Patient/Family meeting held?  No [Patient is confused, no family at bedside]  Palliative Care Outcomes  Provided psychosocial or spiritual support    Palliative Care follow-up planned  Yes, Facility      Patient Active Problem List   Diagnosis Date Noted  . Aspiration pneumonia of both upper lobes due to gastric secretions (HCC) 10/24/2016  . Acute respiratory failure with hypoxia (HCC) 10/21/2016  . Aspiration pneumonia of both lower lobes due to gastric secretions (HCC) 10/21/2016  . 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 MRSA bacteremia 10/19/2016  . Acute renal failure superimposed on stage 3 chronic kidney disease (HCC) 10/19/2016  . Dementia without behavioral disturbance 10/19/2016  . Acute metabolic encephalopathy 10/18/2016  . Palliative care encounter   . Goals of care, counseling/discussion   . DNR (do not resuscitate) discussion   . Sepsis due to methicillin resistant Staphylococcus aureus (MRSA) (HCC) 10/17/2016  . Abnormal ECG 08/23/2012  . Essential hypertension, benign 12/11/2008  . PREMATURE ATRIAL CONTRACTIONS 12/11/2008  . CARDIAC MURMUR 12/11/2008    Palliative Care Assessment & Plan   Patient Profile: 83 y.o.malewith past medical history of Alzheimer's disease, atrial fibrillation, hypertension, staphylococcal skin infection history, herpes zoster infection historyadmitted on 7/15/2018with SI RSS, found to have bacteremia  Assessment: bacteremia;Jon Cross is being treated with IV antibiotics. TE showed vegetation and he has also been diagnosed with endocarditis. He was being treated with IV acyclovir empirically status post spinal tap. CSF cultures were negative and acyclovir ear discontinued.. Chest CT shows consolidation, possible aspiration   Recommendations/Plan: at this point continue to treat the treatable. Son Jon Emperor gives his consent for any and all procedures including life-saving measures of CPR and intubation for his father.   Goals of Care  and Additional Recommendations:  Limitations on Scope of Treatment: Full Scope Treatment  Code Status:    Code Status Orders         Start     Ordered   10/17/16 0410  Full code  Continuous     10/17/16 0410    Code Status History    Date Active Date Inactive Code Status Order ID Comments User Context   This patient has a current code status but no historical code status.       Prognosis:   Unable to determine based on outcomes. 6 months or less would not be surprising if Jon Cross does not have resolution of his encephalopathy. This is based on bacteremia  Discharge Planning:  [facility] for rehab, possible residential placement.  Care plan was discussed with Nursing staff, case manager, social worker, and Dr. Arbutus Leas.   Thank you for allowing the Palliative Medicine Team to assist in the care of this patient.   Time In: 1345  Time Out: 1400  Total Time 15 minutes  Prolonged Time Billed  no       Greater than 50%  of this time was spent counseling and coordinating care related to the above assessment and plan.  Katheran Awe, NP  Please contact Palliative Medicine Team phone at [phone] for questions and concerns.

## 2016-10-25 NOTE — Discharge Summary (Addendum)
Physician Discharge Summary  Jon Cross ZOX:[id number] DOB: [birth year] DOA: 10/16/2016  PCP: Joette Catching, MD  Admit date: 10/16/2016 Discharge date: 10/25/2016  Admitted From: Home Disposition:  SNF  Recommendations for Outpatient Follow-up:  1. Follow up with PCP in 1-2 weeks 2. Please obtain BMP/CBC every 7 days x 5 weeks next draw on 10/31/16 3. Please check vancomycin trough on 10/28/16 and then per PharmD protocol--adjust vancomycin dosing for goal trough 15-20 4. Continue vancomycin through 11/29/16 5. Discontinue PICC line on 11/30/16 6. Speech therapy to re-evaluate patient for safest diet in one week 7. Wean oxygen for oxygen saturation greater than 92%   Discharge Condition: Stable CODE STATUS:FULL Diet recommendation: dysphagia 1 within thin liquids   Brief/Interim Summary: 83 year old male with a history of Alzheimer's dementia, CKD stage III, atrial fibrillation presented with fevers and confusion. Apparently, the patient was working outside in his yard. Later in the day, the patient was found to have altered mental status with generalized weakness. The patient was found to have temperature 100.104F with WBC 20.5 at the time of admission. Blood cultures are growing MRSA. The patient was started on vancomycin. Lumbar culture was performed on 10/18/2016 and showed WBC14 with 80% neutrophils. The patient was started on empiric acyclovir pending results of his HSV PCR. Acyclovir was discontinued a 1 stage is any PCR was negative. TEE revealed a mitral valve vegetation. Repeat blood cultures were obtained and remained negative. Unfortunately, the patient developed respiratory failure secondary to aspiration pneumonia.He was started on Unasyn. Repeat chest x-ray on 10/21/2016 showed atelectasis involving almost the entire left lung with diffuse infiltrates of the right lung. Pulmonary was consulted to assist. The patient was started on Mucomyst and Xopenex nebulizers. As his  medical condition improved, his cough was stronger, and he was able to cough up copious amounts of mucus. CT of the chest was negative for significant mucus plugging and repeat chest x-ray showed improvement of his atelectasis. Continue on Xopenex 3 times daily for another week.  Encourage incentive spirometry regularly.  Discharge Diagnoses:  Sepsis -Secondary to bacteremia -Lactic acid peaked 1.7 -Continue vancomycin -Fluid saline locked secondary to volume overload -leukocytosis improved -remains hemodynamically stable -sepsis physiology resolved  MRSA bacteremia -Source unclear -10/17/16 TTE--EF 50-55%, grade 1 DD, mild AI/TR, trivial pericardial effusion -Surveillance blood cultures--neg to date -Appreciatecardiology for TEE, discussed with Dr. Purvis Sheffield -placed PICC line 7/23  Native valve endocarditis -10/20/16 TEE--1.5 cm x 1.9 cm vegetation on posterior mitral leaflet, trivial MR -likely a poor candidate for any surgical intervention--case discussed with ID, Dr. Luciana Axe 7/19 -continue vancomycin through 11/29/2016 which will mark 42 days after the last negative culture -Check vancomycin trough on 10/28/2016 and thereafter per Pharm.D. protocol to maintain a vancomycin trough 15-20 -Check BMP and CBC every 7 days 5 weeks, first draw 10/31/2016  Acute respiratory failure with hypoxia -Secondary to aspiration pneumonia, pleural effusions and atelectasis of left lung -ContinueUnasyn -Stable on 2 L nasal cannula -10/24/2016--CT chest--mild enlarged mediastinal lymph nodes, bilateral upper lobe opacities, moderate bilateral pleural effusions -Wean oxygen for saturation greater than 92%  Aspiration pneumonia -continueunasyn>>>amox/clav x 3 more days after d/c to complete on week of tx  Atelectasis of left lung -appreciatepulmonary consult -started mucomyst and xopenex -10/22/16--personally reviewed CXR--complete collapse of L-lung, R-lung infiltrates -10/24/2016 CT  chest bilateral upper lobe opacities, atelectasis LLL; mild bilateral pleural effusions   Acute metabolic encephalopathy -Secondary to infectious process -Patient has returned back to  baseline -10/18/2016 lumbar puncture cultures negative -HSV PCR  negative--discontinue acyclovir  Elevated troponin -due to demand ischemia -no chest pain -personally reviewed EKG--sinus with T wave inversion V4-V6 without change  Acute on chronic renal failure--CKD stage III -Secondary to sepsis and volume depletion -Baseline creatinine 1.2-1.4 -Serum creatinine peaked at 1.78 -Serum creatinine 1.31 on the day of discharge   Dementia without behavioral disturbance -According to the patient's primary care note on 08/17/2016, he wasonNamenda and Aricept -Unclear if the patient was taking is properly -Restart Namenda 5 mg daily and Aricept 5 mg daily  - increase Namenda to 10 mg daily on 10/31/2016  - increase Aricept to 10 mg daily on 11/24/2016   B12 deficiency -Restart supplementation  Thrombocytopenia -due to sepsis -check fibrinogen--569 -stable-->trending back up with treatment of infection -am CBC  Hypokalemia -repleted -check mag--1.9  Goals of Care -palliative medicine following -full scope of treatment    Discharge Instructions  Discharge Instructions    Increase activity slowly    Complete by:  As directed      Allergies as of 10/25/2016      Reactions   Dristan Cold [chlorphen-pe-acetaminophen] Nausea Only   Sulfonamide Derivatives Nausea Only      Medication List    TAKE these medications   acetaminophen 500 MG tablet Commonly known as:  TYLENOL Take 500 mg by mouth every 6 (six) hours as needed.   amoxicillin-clavulanate 875-125 MG tablet Commonly known as:  AUGMENTIN Take 1 tablet by mouth every 12 (twelve) hours.   aspirin EC 81 MG tablet Take 81 mg by mouth daily.   cyanocobalamin 500 MCG tablet Take 1 tablet (500 mcg total) by mouth  daily.   donepezil 5 MG tablet Commonly known as:  ARICEPT Take 1 tablet (5 mg total) by mouth at bedtime. X 29 days   donepezil 10 MG tablet Commonly known as:  ARICEPT Take 1 tablet (10 mg total) by mouth at bedtime. Start taking on:  11/24/2016   levalbuterol 1.25 MG/0.5ML nebulizer solution Commonly known as:  XOPENEX Take 1.25 mg by nebulization 3 (three) times daily. X 1 week   memantine 5 MG tablet Commonly known as:  NAMENDA Take 1 tablet (5 mg total) by mouth daily. X 6 days What changed:  additional instructions   memantine 10 MG tablet Commonly known as:  NAMENDA Take 1 tablet (10 mg total) by mouth daily. Start taking on:  11/01/2016 What changed:  You were already taking a medication with the same name, and this prescription was added. Make sure you understand how and when to take each.   trimethoprim-polymyxin b ophthalmic solution Commonly known as:  POLYTRIM Place 1 drop into the right eye every 4 (four) hours. X 3 days   vancomycin 1-5 GM/200ML-% Soln Commonly known as:  VANCOCIN Inject 200 mLs (1,000 mg total) into the vein daily. With last day on 11/29/16       Allergies  Allergen Reactions  . Dristan Cold [Chlorphen-Pe-Acetaminophen] Nausea Only  . Sulfonamide Derivatives Nausea Only    Consultations:  cardiology   Procedures/Studies: Dg Chest 2 View  Result Date: 10/16/2016 CLINICAL DATA:  Altered mental status and weakness after extended time outside. EXAM: CHEST  2 VIEW COMPARISON:  Chest radiograph June 24, 2014 FINDINGS: Cardiac silhouette is moderately enlarged. Calcified aortic knob. Similar mild chronic interstitial changes without pleural effusion or focal consolidation. Increased lung volumes. No pneumothorax. Accentuated kyphosis, chronic approximate T10 compression fracture. Osteopenia. IMPRESSION: Stable cardiomegaly and COPD. Electronically Signed   By: Michel Santee.D.  On: 10/16/2016 23:34   Ct Head Wo Contrast  Result  Date: 10/16/2016 CLINICAL DATA:  83 year old male with altered mental status and weakness today. EXAM: CT HEAD WITHOUT CONTRAST TECHNIQUE: Contiguous axial images were obtained from the base of the skull through the vertex without intravenous contrast. COMPARISON:  None. FINDINGS: Brain: Mild cerebral atrophy. Patchy and confluent areas of decreased attenuation are noted throughout the deep and periventricular white matter of the cerebral hemispheres bilaterally, compatible with chronic microvascular ischemic disease. No evidence of acute infarction, hemorrhage, hydrocephalus, extra-axial collection or mass lesion/mass effect. Vascular: No hyperdense vessel or unexpected calcification. Skull: Normal. Negative for fracture or focal lesion. Sinuses/Orbits: Mild multifocal mucosal thickening in the ethmoid sinuses. No acute finding. Other: None. IMPRESSION: 1. No acute intracranial abnormalities. 2. Mild cerebral atrophy with mild chronic microvascular ischemic changes in the cerebral white matter. Electronically Signed   By: Trudie Reed M.D.   On: 10/16/2016 23:24   Ct Chest W Contrast  Result Date: 10/24/2016 CLINICAL DATA:  Productive cough, fever.  Atelectasis. EXAM: CT CHEST WITH CONTRAST TECHNIQUE: Multidetector CT imaging of the chest was performed during intravenous contrast administration. CONTRAST:  75mL ISOVUE-300 IOPAMIDOL (ISOVUE-300) INJECTION 61% COMPARISON:  Radiograph of October 22, 2016. FINDINGS: Cardiovascular: There is no evidence of thoracic aortic dissection or aneurysm. Aortic atherosclerosis is noted. Coronary artery calcifications are noted. No pericardial effusion is noted. Mediastinum/Nodes: Mildly enlarged mediastinal adenopathy is noted with 1.5 cm lymph node seen in subcarinal region 14 mm right peritracheal lymph node is noted. 8 mm lymph node is noted in aortopulmonary window. Lungs/Pleura: No pneumothorax is noted. Airspace opacities are noted in both upper lobes concerning for  pneumonia. Left lower lobe atelectasis or pneumonia is noted as well. Moderate bilateral pleural effusions are noted. Upper Abdomen: No acute abnormality. Musculoskeletal: No chest wall abnormality. No acute or significant osseous findings. IMPRESSION: Coronary artery calcifications are noted suggesting coronary artery disease. Bilateral pulmonary airspace opacities are noted concerning for multifocal pneumonia. Moderate bilateral pleural effusions are noted. Mildly enlarged mediastinal adenopathy is noted which most likely is inflammatory or reactive in etiology, but neoplasm cannot be excluded. Clinical correlation is recommended. Aortic Atherosclerosis (ICD10-I70.0). Electronically Signed   By: Lupita Raider, M.D.   On: 10/24/2016 09:47   Dg Chest Port 1 View  Result Date: 10/24/2016 CLINICAL DATA:  Status post PICC line placement EXAM: PORTABLE CHEST 1 VIEW COMPARISON:  10/22/2016 FINDINGS: Cardiac shadow is enlarged. Significant improved aeration on the left is noted when compare with the prior exam. Some residual effusion is noted. Patchy infiltrative changes are noted bilaterally. The PICC line is now seen with the catheter tip at the cavoatrial junction. No acute bony abnormality is noted. IMPRESSION: Multifocal pneumonia and left-sided effusion. Status post PICC line placement in satisfactory position. Electronically Signed   By: Alcide Clever M.D.   On: 10/24/2016 15:35   Dg Chest Port 1 View  Result Date: 10/22/2016 CLINICAL DATA:  Atelectasis follow-up EXAM: PORTABLE CHEST 1 VIEW COMPARISON:  10/21/2016 FINDINGS: Progressive collapse of the left lung which is now completely collapsed. Mild shift of the mediastinal structures to the left. Diffuse airspace disease in the right may represent pneumonia or edema and is unchanged. IMPRESSION: Complete collapse left lung which has progressed Diffuse right lung edema/pneumonia unchanged Electronically Signed   By: Marlan Palau M.D.   On: 10/22/2016  13:29   Dg Chest Port 1 View  Result Date: 10/21/2016 CLINICAL DATA:  Hypoxia. EXAM: PORTABLE CHEST 1 VIEW  COMPARISON:  10/17/2016 . FINDINGS: Cardiomegaly is again noted. Near complete opacification of the left lung with volume loss noted. This is consistent with atelectasis. Diffuse right lung infiltrate and/or edema. Left-sided pleural effusion cannot be excluded. No pneumothorax . IMPRESSION: 1. Near complete opacification left lung with volume loss. This consistent with left lung atelectasis. Underlying infiltrate cannot be excluded. Left-sided pleural effusion cannot be excluded. 2.  Diffuse infiltrate/edema right lung. 3. Persistent cardiomegaly. Electronically Signed   By: Maisie Fus  Register   On: 10/21/2016 07:44   Dg Chest Port 1 View  Result Date: 10/17/2016 CLINICAL DATA:  83 year old male with fever.  Subsequent encounter. EXAM: PORTABLE CHEST 1 VIEW COMPARISON:  10/16/2016. FINDINGS: Cardiomegaly. Pulmonary vascular congestion. No segmental consolidation. Slightly limited evaluation of retrocardiac region. Calcified slightly tortuous aorta. IMPRESSION: Cardiomegaly. Pulmonary vascular congestion. No segmental consolidation. Slightly limited evaluation of retrocardiac region. Electronically Signed   By: Lacy Duverney M.D.   On: 10/17/2016 14:42   Dg Swallowing Func-speech Pathology  Result Date: 10/22/2016 Objective Swallowing Evaluation: Type of Study: MBS-Modified Barium Swallow Study Patient Details Name: Jon Cross MRN: [id number] Date of Birth: 1932-07-19 Today's Date: 10/22/2016 Time: SLP Start Time (ACUTE ONLY): 0800-SLP Stop Time (ACUTE ONLY): 0915 SLP Time Calculation (min) (ACUTE ONLY): 75 min Past Medical History: Past Medical History: Diagnosis Date . Alzheimer disease  . Atrial fibrillation (HCC)  . Essential hypertension, benign  . Herpes zoster  . Staphylococcal skin infection  Past Surgical History: Past Surgical History: Procedure Laterality Date . INGUINAL HERNIA REPAIR   .  TEE WITHOUT CARDIOVERSION N/A 10/20/2016  Procedure: TRANSESOPHAGEAL ECHOCARDIOGRAM (TEE) WITH PROPOFOL;  Surgeon: Laqueta Linden, MD;  Location: [facility];  Service: Cardiovascular;  Laterality: N/A; . TIBIA FRACTURE SURGERY    Open reduction and internal fixation of left tibia fracture . TONSILLECTOMY   . UMBILICAL HERNIA REPAIR   HPI: Dereck Agerton Mooreis a 83 y.o.malewith history of dementia, chronic kidney disease stage II A. fib as per the charts was brought to the ER. Patient to follow very weak and mildly confused. As per the report patient has been working in his yard today and [place] when patient was found to be weak and confused. Patient has not complained of any chest pain or shortness of breath nausea vomiting or diarrhea. Has been having persistent cough.In the ER patient is being found to have productive cough is oriented to his name only. Chest x-ray and UA are unremarkable. Patient was tachycardic and febrile with temperatures around 103F. No signs of meningitis. Patient had blood cultures drawn and since patient was having productive cough and began antibiotics for pneumonia were started. Troponin is found to be very elevated. EKG showssinus tachycardia.CT of the head was unremarkable. BSE ordered. Subjective: Pt alert and c/o right eye discomfort Assessment / Plan / Recommendation CHL IP CLINICAL IMPRESSIONS 10/22/2016 Clinical Impression Pt presents with mild oropharyngeal dysphagia and mild pharyngoesophageal dysphagia characterized by prolonged oral phase with lingual pumping and inefficient bolus prep with solids, decreased velopharyngeal closure with variable velopharyngeal regurgitation with thins, reduced tongue base retraction and pharyngeal constriction, reduced cricopharyngeal relaxation, and prominent cricopharyngeus resulting in residuals post swallow along posterior pharyngeal wall, pyriforms, and cricopharyngeus across all textures and consistencies (mild to  moderate amount). Residuals were reduced with cued repeat/dry swallows (not completely eliminated). Pt also benefited from liquid wash (following puree/mech soft with sip of liquid) to reduce residuals. Pt does appear to have bony protrusion (osteophytes) near C4-5, but  does not impinge greatly into pharyngeal space, but could impact pharyngeal pressure given current weakness. Pt had a single episode of trace laryngeal penetration with straw sips thin (when co-occurring velopharyngeal backflow noted) with trace amount of thins entering interarytenoid space, but was immediately expelled. Pt assessed with both thin and nectars via cup and straw sips and self presented when able. No aspiration observed, however pt with fairly consistent throat clear/soft cough after many of his swallows. Pt with prominent cricopharyngeus with reduced relaxation resulting in tail end of bolus remaining near CP and posterior pharyngeal wall. Recommend D1/puree with thin liquids, crush meds as able in puree. Straws OK. Prognosis for advancing diet textures is good with improved endurance and strength. D/W Dr. Arbutus Leas, and pt medical status tenuous at this time. Recommend po only when alert and upright. RN to help with oral care and brush tongue. F/U SLP at SNF. SLP Visit Diagnosis Dysphagia, oropharyngeal phase (R13.12) Attention and concentration deficit following -- Frontal lobe and executive function deficit following -- Impact on safety and function Mild aspiration risk;Moderate aspiration risk   CHL IP TREATMENT RECOMMENDATION 10/22/2016 Treatment Recommendations Therapy as outlined in treatment plan below   Prognosis 10/22/2016 Prognosis for Safe Diet Advancement Fair Barriers to Reach Goals (No Data) Barriers/Prognosis Comment -- CHL IP DIET RECOMMENDATION 10/22/2016 SLP Diet Recommendations Dysphagia 1 (Puree) solids;Thin liquid Liquid Administration via Straw;Cup Medication Administration Crushed with puree Compensations Slow  rate;Multiple dry swallows after each bite/sip;Follow solids with liquid;Effortful swallow Postural Changes Remain semi-upright after after feeds/meals (Comment);Seated upright at 90 degrees   CHL IP OTHER RECOMMENDATIONS 10/22/2016 Recommended Consults -- Oral Care Recommendations Oral care BID;Staff/trained caregiver to provide oral care Other Recommendations Clarify dietary restrictions   CHL IP FOLLOW UP RECOMMENDATIONS 10/22/2016 Follow up Recommendations Skilled Nursing facility   [facility] IP FREQUENCY AND DURATION 10/22/2016 Speech Therapy Frequency (ACUTE ONLY) min 2x/week Treatment Duration 1 week      CHL IP ORAL PHASE 10/22/2016 Oral Phase Impaired Oral - Pudding Teaspoon -- Oral - Pudding Cup -- Oral - Honey Teaspoon -- Oral - Honey Cup -- Oral - Nectar Teaspoon -- Oral - Nectar Cup -- Oral - Nectar Straw -- Oral - Thin Teaspoon -- Oral - Thin Cup -- Oral - Thin Straw Decreased velopharyngeal closure;Nasal reflux Oral - Puree Weak lingual manipulation;Lingual pumping;Delayed oral transit Oral - Mech Soft -- Oral - Regular -- Oral - Multi-Consistency -- Oral - Pill -- Oral Phase - Comment --  CHL IP PHARYNGEAL PHASE 10/22/2016 Pharyngeal Phase Impaired Pharyngeal- Pudding Teaspoon -- Pharyngeal -- Pharyngeal- Pudding Cup -- Pharyngeal -- Pharyngeal- Honey Teaspoon -- Pharyngeal -- Pharyngeal- Honey Cup -- Pharyngeal -- Pharyngeal- Nectar Teaspoon -- Pharyngeal -- Pharyngeal- Nectar Cup -- Pharyngeal -- Pharyngeal- Nectar Straw Pharyngeal residue - pyriform;Pharyngeal residue - posterior pharnyx;Pharyngeal residue - cp segment;Reduced tongue base retraction;Pharyngeal residue - valleculae Pharyngeal -- Pharyngeal- Thin Teaspoon -- Pharyngeal -- Pharyngeal- Thin Cup Reduced tongue base retraction;Pharyngeal residue - cp segment;Pharyngeal residue - pyriform Pharyngeal -- Pharyngeal- Thin Straw Reduced tongue base retraction;Penetration/Aspiration during swallow;Pharyngeal residue - pyriform;Pharyngeal residue - cp  segment;Nasopharyngeal reflux Pharyngeal Material enters airway, remains ABOVE vocal cords then ejected out;Material does not enter airway Pharyngeal- Puree Delayed swallow initiation-vallecula;Reduced tongue base retraction;Pharyngeal residue - valleculae;Pharyngeal residue - posterior pharnyx;Pharyngeal residue - cp segment;Reduced pharyngeal peristalsis Pharyngeal -- Pharyngeal- Mechanical Soft Reduced pharyngeal peristalsis;Reduced tongue base retraction;Pharyngeal residue - valleculae;Pharyngeal residue - cp segment Pharyngeal -- Pharyngeal- Regular -- Pharyngeal -- Pharyngeal- Multi-consistency -- Pharyngeal -- Pharyngeal- Pill NT  Pharyngeal -- Pharyngeal Comment reduced CP relaxation with resultant CP/PPW residue post swallow  CHL IP CERVICAL ESOPHAGEAL PHASE 10/22/2016 Cervical Esophageal Phase Impaired Pudding Teaspoon -- Pudding Cup -- Honey Teaspoon -- Honey Cup -- Nectar Teaspoon -- Nectar Cup -- Nectar Straw Reduced cricopharyngeal relaxation;Prominent cricopharyngeal segment Thin Teaspoon -- Thin Cup -- Thin Straw Reduced cricopharyngeal relaxation;Prominent cricopharyngeal segment Puree Reduced cricopharyngeal relaxation;Prominent cricopharyngeal segment Mechanical Soft -- Regular -- Multi-consistency -- Pill -- Cervical Esophageal Comment tail of bolus remains near UES and PPW Thank you, Havery MorosDabney Porter, CCC-SLP [phone] PORTER,DABNEY 10/22/2016, 9:48 AM              Dg Fluoro Guide Lumbar Puncture  Result Date: 10/18/2016 CLINICAL DATA:  Fever EXAM: DIAGNOSTIC LUMBAR PUNCTURE UNDER FLUOROSCOPIC GUIDANCE FLUOROSCOPY TIME:  Fluoroscopy Time:  0 minutes 30 seconds Radiation Exposure Index (if provided by the fluoroscopic device): 4.7 mGy Number of Acquired Spot Images: 2 screen captures during fluoroscopy PROCEDURE: Procedure, benefits, and risks were discussed with the patient, including alternatives. Patient's questions were answered. Written informed consent was obtained by the service from the  patient's son. Timeout protocol followed. Patient placed prone. L4-L5 disc space was localized under fluoroscopy. Skin prepped and draped in usual sterile fashion. Skin and soft tissues anesthetized with 2 mL of 1% lidocaine. A 22 gauge needle was advanced into the spinal canal where clear colorless CSF was encountered. 10.5 mL of CSF was obtained in 4 tubes for requested analysis. Procedure tolerated very well by patient without immediate complication. IMPRESSION: Successful fluoroscopic guided lumbar puncture as above. Electronically Signed   By: Ulyses SouthwardMark  Boles M.D.   On: 10/18/2016 11:42        Discharge Exam: Vitals:   10/24/16 2107 10/25/16 0517  BP: 139/72 125/66  Pulse: 75 78  Resp: 16 16  Temp: 98.7 F (37.1 C) 97.9 F (36.6 C)   Vitals:   10/24/16 2107 10/25/16 0517 10/25/16 0816 10/25/16 0833  BP: 139/72 125/66    Pulse: 75 78    Resp: 16 16    Temp: 98.7 F (37.1 C) 97.9 F (36.6 C)    TempSrc: Oral Oral    SpO2: (!) 86% 96% 92% 94%  Weight:  59.4 kg (130 lb 14.4 oz)    Height:        General: Pt is alert, awake, not in acute distress Cardiovascular: RRR, S1/S2 +, no rubs, no gallops Respiratory: bibasilar crackles, no wheeze Abdominal: Soft, NT, ND, bowel sounds + Extremities: no edema, no cyanosis   The results of significant diagnostics from this hospitalization (including imaging, microbiology, ancillary and laboratory) are listed below for reference.    Significant Diagnostic Studies: Dg Chest 2 View  Result Date: 10/16/2016 CLINICAL DATA:  Altered mental status and weakness after extended time outside. EXAM: CHEST  2 VIEW COMPARISON:  Chest radiograph June 24, 2014 FINDINGS: Cardiac silhouette is moderately enlarged. Calcified aortic knob. Similar mild chronic interstitial changes without pleural effusion or focal consolidation. Increased lung volumes. No pneumothorax. Accentuated kyphosis, chronic approximate T10 compression fracture. Osteopenia. IMPRESSION:  Stable cardiomegaly and COPD. Electronically Signed   By: Awilda Metroourtnay  Bloomer M.D.   On: 10/16/2016 23:34   Ct Head Wo Contrast  Result Date: 10/16/2016 CLINICAL DATA:  83 year old male with altered mental status and weakness today. EXAM: CT HEAD WITHOUT CONTRAST TECHNIQUE: Contiguous axial images were obtained from the base of the skull through the vertex without intravenous contrast. COMPARISON:  None. FINDINGS: Brain: Mild cerebral atrophy. Patchy and confluent areas of  decreased attenuation are noted throughout the deep and periventricular white matter of the cerebral hemispheres bilaterally, compatible with chronic microvascular ischemic disease. No evidence of acute infarction, hemorrhage, hydrocephalus, extra-axial collection or mass lesion/mass effect. Vascular: No hyperdense vessel or unexpected calcification. Skull: Normal. Negative for fracture or focal lesion. Sinuses/Orbits: Mild multifocal mucosal thickening in the ethmoid sinuses. No acute finding. Other: None. IMPRESSION: 1. No acute intracranial abnormalities. 2. Mild cerebral atrophy with mild chronic microvascular ischemic changes in the cerebral white matter. Electronically Signed   By: Trudie Reed M.D.   On: 10/16/2016 23:24   Ct Chest W Contrast  Result Date: 10/24/2016 CLINICAL DATA:  Productive cough, fever.  Atelectasis. EXAM: CT CHEST WITH CONTRAST TECHNIQUE: Multidetector CT imaging of the chest was performed during intravenous contrast administration. CONTRAST:  75mL ISOVUE-300 IOPAMIDOL (ISOVUE-300) INJECTION 61% COMPARISON:  Radiograph of October 22, 2016. FINDINGS: Cardiovascular: There is no evidence of thoracic aortic dissection or aneurysm. Aortic atherosclerosis is noted. Coronary artery calcifications are noted. No pericardial effusion is noted. Mediastinum/Nodes: Mildly enlarged mediastinal adenopathy is noted with 1.5 cm lymph node seen in subcarinal region 14 mm right peritracheal lymph node is noted. 8 mm lymph node is  noted in aortopulmonary window. Lungs/Pleura: No pneumothorax is noted. Airspace opacities are noted in both upper lobes concerning for pneumonia. Left lower lobe atelectasis or pneumonia is noted as well. Moderate bilateral pleural effusions are noted. Upper Abdomen: No acute abnormality. Musculoskeletal: No chest wall abnormality. No acute or significant osseous findings. IMPRESSION: Coronary artery calcifications are noted suggesting coronary artery disease. Bilateral pulmonary airspace opacities are noted concerning for multifocal pneumonia. Moderate bilateral pleural effusions are noted. Mildly enlarged mediastinal adenopathy is noted which most likely is inflammatory or reactive in etiology, but neoplasm cannot be excluded. Clinical correlation is recommended. Aortic Atherosclerosis (ICD10-I70.0). Electronically Signed   By: Lupita Raider, M.D.   On: 10/24/2016 09:47   Dg Chest Port 1 View  Result Date: 10/24/2016 CLINICAL DATA:  Status post PICC line placement EXAM: PORTABLE CHEST 1 VIEW COMPARISON:  10/22/2016 FINDINGS: Cardiac shadow is enlarged. Significant improved aeration on the left is noted when compare with the prior exam. Some residual effusion is noted. Patchy infiltrative changes are noted bilaterally. The PICC line is now seen with the catheter tip at the cavoatrial junction. No acute bony abnormality is noted. IMPRESSION: Multifocal pneumonia and left-sided effusion. Status post PICC line placement in satisfactory position. Electronically Signed   By: Alcide Clever M.D.   On: 10/24/2016 15:35   Dg Chest Port 1 View  Result Date: 10/22/2016 CLINICAL DATA:  Atelectasis follow-up EXAM: PORTABLE CHEST 1 VIEW COMPARISON:  10/21/2016 FINDINGS: Progressive collapse of the left lung which is now completely collapsed. Mild shift of the mediastinal structures to the left. Diffuse airspace disease in the right may represent pneumonia or edema and is unchanged. IMPRESSION: Complete collapse left  lung which has progressed Diffuse right lung edema/pneumonia unchanged Electronically Signed   By: Marlan Palau M.D.   On: 10/22/2016 13:29   Dg Chest Port 1 View  Result Date: 10/21/2016 CLINICAL DATA:  Hypoxia. EXAM: PORTABLE CHEST 1 VIEW COMPARISON:  10/17/2016 . FINDINGS: Cardiomegaly is again noted. Near complete opacification of the left lung with volume loss noted. This is consistent with atelectasis. Diffuse right lung infiltrate and/or edema. Left-sided pleural effusion cannot be excluded. No pneumothorax . IMPRESSION: 1. Near complete opacification left lung with volume loss. This consistent with left lung atelectasis. Underlying infiltrate cannot be excluded. Left-sided  pleural effusion cannot be excluded. 2.  Diffuse infiltrate/edema right lung. 3. Persistent cardiomegaly. Electronically Signed   By: Maisie Fus  Register   On: 10/21/2016 07:44   Dg Chest Port 1 View  Result Date: 10/17/2016 CLINICAL DATA:  83 year old male with fever.  Subsequent encounter. EXAM: PORTABLE CHEST 1 VIEW COMPARISON:  10/16/2016. FINDINGS: Cardiomegaly. Pulmonary vascular congestion. No segmental consolidation. Slightly limited evaluation of retrocardiac region. Calcified slightly tortuous aorta. IMPRESSION: Cardiomegaly. Pulmonary vascular congestion. No segmental consolidation. Slightly limited evaluation of retrocardiac region. Electronically Signed   By: Lacy Duverney M.D.   On: 10/17/2016 14:42   Dg Swallowing Func-speech Pathology  Result Date: 10/22/2016 Objective Swallowing Evaluation: Type of Study: MBS-Modified Barium Swallow Study Patient Details Name: Jon Cross MRN: [id number] Date of Birth: [birth year] Today's Date: 10/22/2016 Time: SLP Start Time (ACUTE ONLY): 0800-SLP Stop Time (ACUTE ONLY): 0915 SLP Time Calculation (min) (ACUTE ONLY): 75 min Past Medical History: Past Medical History: Diagnosis Date . Alzheimer disease  . Atrial fibrillation (HCC)  . Essential hypertension, benign  . Herpes zoster   . Staphylococcal skin infection  Past Surgical History: Past Surgical History: Procedure Laterality Date . INGUINAL HERNIA REPAIR   . TEE WITHOUT CARDIOVERSION N/A 10/20/2016  Procedure: TRANSESOPHAGEAL ECHOCARDIOGRAM (TEE) WITH PROPOFOL;  Surgeon: Laqueta Linden, MD;  Location: [facility];  Service: Cardiovascular;  Laterality: N/A; . TIBIA FRACTURE SURGERY    Open reduction and internal fixation of left tibia fracture . TONSILLECTOMY   . UMBILICAL HERNIA REPAIR   HPI: Gabryel Files Mooreis a 83 y.o.malewith history of dementia, chronic kidney disease stage II A. fib as per the charts was brought to the ER. Patient to follow very weak and mildly confused. As per the report patient has been working in his yard today and [place] when patient was found to be weak and confused. Patient has not complained of any chest pain or shortness of breath nausea vomiting or diarrhea. Has been having persistent cough.In the ER patient is being found to have productive cough is oriented to his name only. Chest x-ray and UA are unremarkable. Patient was tachycardic and febrile with temperatures around 103F. No signs of meningitis. Patient had blood cultures drawn and since patient was having productive cough and began antibiotics for pneumonia were started. Troponin is found to be very elevated. EKG showssinus tachycardia.CT of the head was unremarkable. BSE ordered. Subjective: Pt alert and c/o right eye discomfort Assessment / Plan / Recommendation CHL IP CLINICAL IMPRESSIONS 10/22/2016 Clinical Impression Pt presents with mild oropharyngeal dysphagia and mild pharyngoesophageal dysphagia characterized by prolonged oral phase with lingual pumping and inefficient bolus prep with solids, decreased velopharyngeal closure with variable velopharyngeal regurgitation with thins, reduced tongue base retraction and pharyngeal constriction, reduced cricopharyngeal relaxation, and prominent cricopharyngeus resulting  in residuals post swallow along posterior pharyngeal wall, pyriforms, and cricopharyngeus across all textures and consistencies (mild to moderate amount). Residuals were reduced with cued repeat/dry swallows (not completely eliminated). Pt also benefited from liquid wash (following puree/mech soft with sip of liquid) to reduce residuals. Pt does appear to have bony protrusion (osteophytes) near C4-5, but does not impinge greatly into pharyngeal space, but could impact pharyngeal pressure given current weakness. Pt had a single episode of trace laryngeal penetration with straw sips thin (when co-occurring velopharyngeal backflow noted) with trace amount of thins entering interarytenoid space, but was immediately expelled. Pt assessed with both thin and nectars via cup and straw sips and self presented when able.  No aspiration observed, however pt with fairly consistent throat clear/soft cough after many of his swallows. Pt with prominent cricopharyngeus with reduced relaxation resulting in tail end of bolus remaining near CP and posterior pharyngeal wall. Recommend D1/puree with thin liquids, crush meds as able in puree. Straws OK. Prognosis for advancing diet textures is good with improved endurance and strength. D/W Dr. Arbutus Leas, and pt medical status tenuous at this time. Recommend po only when alert and upright. RN to help with oral care and brush tongue. F/U SLP at SNF. SLP Visit Diagnosis Dysphagia, oropharyngeal phase (R13.12) Attention and concentration deficit following -- Frontal lobe and executive function deficit following -- Impact on safety and function Mild aspiration risk;Moderate aspiration risk   CHL IP TREATMENT RECOMMENDATION 10/22/2016 Treatment Recommendations Therapy as outlined in treatment plan below   Prognosis 10/22/2016 Prognosis for Safe Diet Advancement Fair Barriers to Reach Goals (No Data) Barriers/Prognosis Comment -- [facility]L IP DIET RECOMMENDATION 10/22/2016 SLP Diet Recommendations Dysphagia 1  (Puree) solids;Thin liquid Liquid Administration via Straw;Cup Medication Administration Crushed with puree Compensations Slow rate;Multiple dry swallows after each bite/sip;Follow solids with liquid;Effortful swallow Postural Changes Remain semi-upright after after feeds/meals (Comment);Seated upright at 90 degrees   CHL IP OTHER RECOMMENDATIONS 10/22/2016 Recommended Consults -- Oral Care Recommendations Oral care BID;Staff/trained caregiver to provide oral care Other Recommendations Clarify dietary restrictions   [facility] IP FOLLOW UP RECOMMENDATIONS 10/22/2016 Follow up Recommendations Skilled Nursing facility   [facility] IP FREQUENCY AND DURATION 10/22/2016 Speech Therapy Frequency (ACUTE ONLY) min 2x/week Treatment Duration 1 week      CHL IP ORAL PHASE 10/22/2016 Oral Phase Impaired Oral - Pudding Teaspoon -- Oral - Pudding Cup -- Oral - Honey Teaspoon -- Oral - Honey Cup -- Oral - Nectar Teaspoon -- Oral - Nectar Cup -- Oral - Nectar Straw -- Oral - Thin Teaspoon -- Oral - Thin Cup -- Oral - Thin Straw Decreased velopharyngeal closure;Nasal reflux Oral - Puree Weak lingual manipulation;Lingual pumping;Delayed oral transit Oral - Mech Soft -- Oral - Regular -- Oral - Multi-Consistency -- Oral - Pill -- Oral Phase - Comment --  CHL IP PHARYNGEAL PHASE 10/22/2016 Pharyngeal Phase Impaired Pharyngeal- Pudding Teaspoon -- Pharyngeal -- Pharyngeal- Pudding Cup -- Pharyngeal -- Pharyngeal- Honey Teaspoon -- Pharyngeal -- Pharyngeal- Honey Cup -- Pharyngeal -- Pharyngeal- Nectar Teaspoon -- Pharyngeal -- Pharyngeal- Nectar Cup -- Pharyngeal -- Pharyngeal- Nectar Straw Pharyngeal residue - pyriform;Pharyngeal residue - posterior pharnyx;Pharyngeal residue - cp segment;Reduced tongue base retraction;Pharyngeal residue - valleculae Pharyngeal -- Pharyngeal- Thin Teaspoon -- Pharyngeal -- Pharyngeal- Thin Cup Reduced tongue base retraction;Pharyngeal residue - cp segment;Pharyngeal residue - pyriform Pharyngeal -- Pharyngeal- Thin  Straw Reduced tongue base retraction;Penetration/Aspiration during swallow;Pharyngeal residue - pyriform;Pharyngeal residue - cp segment;Nasopharyngeal reflux Pharyngeal Material enters airway, remains ABOVE vocal cords then ejected out;Material does not enter airway Pharyngeal- Puree Delayed swallow initiation-vallecula;Reduced tongue base retraction;Pharyngeal residue - valleculae;Pharyngeal residue - posterior pharnyx;Pharyngeal residue - cp segment;Reduced pharyngeal peristalsis Pharyngeal -- Pharyngeal- Mechanical Soft Reduced pharyngeal peristalsis;Reduced tongue base retraction;Pharyngeal residue - valleculae;Pharyngeal residue - cp segment Pharyngeal -- Pharyngeal- Regular -- Pharyngeal -- Pharyngeal- Multi-consistency -- Pharyngeal -- Pharyngeal- Pill NT Pharyngeal -- Pharyngeal Comment reduced CP relaxation with resultant CP/PPW residue post swallow  CHL IP CERVICAL ESOPHAGEAL PHASE 10/22/2016 Cervical Esophageal Phase Impaired Pudding Teaspoon -- Pudding Cup -- Honey Teaspoon -- Honey Cup -- Nectar Teaspoon -- Nectar Cup -- Nectar Straw Reduced cricopharyngeal relaxation;Prominent cricopharyngeal segment Thin Teaspoon -- Thin Cup -- Thin Straw Reduced cricopharyngeal relaxation;Prominent cricopharyngeal segment  Puree Reduced cricopharyngeal relaxation;Prominent cricopharyngeal segment Mechanical Soft -- Regular -- Multi-consistency -- Pill -- Cervical Esophageal Comment tail of bolus remains near UES and PPW Thank you, Havery Moros, CCC-SLP [phone] PORTER,DABNEY 10/22/2016, 9:48 AM              Dg Fluoro Guide Lumbar Puncture  Result Date: 10/18/2016 CLINICAL DATA:  Fever EXAM: DIAGNOSTIC LUMBAR PUNCTURE UNDER FLUOROSCOPIC GUIDANCE FLUOROSCOPY TIME:  Fluoroscopy Time:  0 minutes 30 seconds Radiation Exposure Index (if provided by the fluoroscopic device): 4.7 mGy Number of Acquired Spot Images: 2 screen captures during fluoroscopy PROCEDURE: Procedure, benefits, and risks were discussed with the  patient, including alternatives. Patient's questions were answered. Written informed consent was obtained by the service from the patient's son. Timeout protocol followed. Patient placed prone. L4-L5 disc space was localized under fluoroscopy. Skin prepped and draped in usual sterile fashion. Skin and soft tissues anesthetized with 2 mL of 1% lidocaine. A 22 gauge needle was advanced into the spinal canal where clear colorless CSF was encountered. 10.5 mL of CSF was obtained in 4 tubes for requested analysis. Procedure tolerated very well by patient without immediate complication. IMPRESSION: Successful fluoroscopic guided lumbar puncture as above. Electronically Signed   By: Ulyses Southward M.D.   On: 10/18/2016 11:42     Microbiology: Recent Results (from the past 240 hour(s))  Urine culture     Status: None   Collection Time: 10/16/16 11:13 PM  Result Value Ref Range Status   Specimen Description URINE, CLEAN CATCH  Final   Special Requests NONE  Final   Culture   Final    NO GROWTH Performed at [facility], [address], [place], [place] [address]    Report Status 10/18/2016 FINAL  Final  Blood Culture (routine x 2)     Status: Abnormal   Collection Time: 10/17/16 12:37 AM  Result Value Ref Range Status   Specimen Description BLOOD RIGHT ARM  Final   Special Requests   Final    BOTTLES DRAWN AEROBIC AND ANAEROBIC Blood Culture adequate volume   Culture  Setup Time   Final    GRAM POSITIVE COCCI Gram Stain Report Called to,Read Back By and Verified With: BIVENS,L @ 1243 ON 7.16.18 BY BOWMAN,L IN BOTH AEROBIC AND ANAEROBIC BOTTLES    Culture METHICILLIN RESISTANT STAPHYLOCOCCUS AUREUS (A)  Final   Report Status 10/19/2016 FINAL  Final   Organism ID, Bacteria METHICILLIN RESISTANT STAPHYLOCOCCUS AUREUS  Final      Susceptibility   Methicillin resistant staphylococcus aureus - MIC*    CIPROFLOXACIN <=0.5 SENSITIVE Sensitive     ERYTHROMYCIN >=8 RESISTANT Resistant      GENTAMICIN <=0.5 SENSITIVE Sensitive     OXACILLIN >=4 RESISTANT Resistant     TETRACYCLINE <=1 SENSITIVE Sensitive     VANCOMYCIN 1 SENSITIVE Sensitive     TRIMETH/SULFA <=10 SENSITIVE Sensitive     CLINDAMYCIN <=0.25 SENSITIVE Sensitive     RIFAMPIN <=0.5 SENSITIVE Sensitive     Inducible Clindamycin NEGATIVE Sensitive     * METHICILLIN RESISTANT STAPHYLOCOCCUS AUREUS  Blood Culture ID Panel (Reflexed)     Status: Abnormal   Collection Time: 10/17/16 12:37 AM  Result Value Ref Range Status   Enterococcus species NOT DETECTED NOT DETECTED Final   Listeria monocytogenes NOT DETECTED NOT DETECTED Final   Staphylococcus species DETECTED (A) NOT DETECTED Final    Comment: CRITICAL RESULT CALLED TO, READ BACK BY AND VERIFIED WITH: Hector Brunswick RN, AT 1844 10/17/16 BY  D. VANHOOK    Staphylococcus aureus DETECTED (A) NOT DETECTED Final    Comment: Methicillin (oxacillin)-resistant Staphylococcus aureus (MRSA). MRSA is predictably resistant to beta-lactam antibiotics (except ceftaroline). Preferred therapy is vancomycin unless clinically contraindicated. Patient requires contact precautions if  hospitalized. CRITICAL RESULT CALLED TO, READ BACK BY AND VERIFIED WITH: L. BIVENS RN, AT 1844 10/17/16 BY D. VANHOOK    Methicillin resistance DETECTED (A) NOT DETECTED Final    Comment: CRITICAL RESULT CALLED TO, READ BACK BY AND VERIFIED WITH: L. BIVENS RN, AT 1844 10/17/16 BY D. VANHOOK    Streptococcus species NOT DETECTED NOT DETECTED Final   Streptococcus agalactiae NOT DETECTED NOT DETECTED Final   Streptococcus pneumoniae NOT DETECTED NOT DETECTED Final   Streptococcus pyogenes NOT DETECTED NOT DETECTED Final   Acinetobacter baumannii NOT DETECTED NOT DETECTED Final   Enterobacteriaceae species NOT DETECTED NOT DETECTED Final   Enterobacter cloacae complex NOT DETECTED NOT DETECTED Final   Escherichia coli NOT DETECTED NOT DETECTED Final   Klebsiella oxytoca NOT DETECTED NOT DETECTED Final    Klebsiella pneumoniae NOT DETECTED NOT DETECTED Final   Proteus species NOT DETECTED NOT DETECTED Final   Serratia marcescens NOT DETECTED NOT DETECTED Final   Haemophilus influenzae NOT DETECTED NOT DETECTED Final   Neisseria meningitidis NOT DETECTED NOT DETECTED Final   Pseudomonas aeruginosa NOT DETECTED NOT DETECTED Final   Candida albicans NOT DETECTED NOT DETECTED Final   Candida glabrata NOT DETECTED NOT DETECTED Final   Candida krusei NOT DETECTED NOT DETECTED Final   Candida parapsilosis NOT DETECTED NOT DETECTED Final   Candida tropicalis NOT DETECTED NOT DETECTED Final    Comment: Performed at [facility], [address] [place], [place] [address]  Blood Culture (routine x 2)     Status: Abnormal   Collection Time: 10/17/16 12:46 AM  Result Value Ref Range Status   Specimen Description BLOOD LEFT ARM  Final   Special Requests   Final    BOTTLES DRAWN AEROBIC ONLY Blood Culture results may not be optimal due to an inadequate volume of blood received in culture bottles   Culture  Setup Time   Final    GRAM POSITIVE COCCI Gram Stain Report Called to,Read Back By and Verified With: BIVENS,L @ 1243 ON 7.16.18 BY BOWMAN,L AEROBIC BOTTLE ONLY    Culture (A)  Final    STAPHYLOCOCCUS AUREUS SUSCEPTIBILITIES PERFORMED ON PREVIOUS CULTURE WITHIN THE LAST 5 DAYS. Performed at [facility], [address], [place], [place] [address]    Report Status 10/19/2016 FINAL  Final  Gram stain     Status: None   Collection Time: 10/18/16 10:34 AM  Result Value Ref Range Status   Specimen Description CSF  Final   Special Requests NONE  Final   Gram Stain   Final    CYTOSPIN SMEAR WBC SEEN WBC PRESENT,BOTH PMN AND MONONUCLEAR NO ORGANISMS SEEN   Report Status 10/18/2016 FINAL  Final  CSF culture     Status: None   Collection Time: 10/18/16 10:34 AM  Result Value Ref Range Status   Specimen Description CSF  Final   Special Requests NONE  Final   Gram Stain   Final     CYTOSPIN SMEAR NO ORGANISMS SEEN WBC SEEN WBC PRESENT,BOTH PMN AND MONONUCLEAR    Culture   Final    NO GROWTH 3 DAYS Performed at [facility], [address], [place], [place] [address]    Report Status 10/22/2016 FINAL  Final  Culture, blood (Routine X 2) w Reflex to ID Panel     Status: None   Collection Time: 10/19/16  6:41 AM  Result Value Ref Range Status   Specimen Description BLOOD RIGHT ARM  Final   Special Requests Blood Culture adequate volume  Final   Culture NO GROWTH 5 DAYS  Final   Report Status 10/24/2016 FINAL  Final  Culture, blood (Routine X 2) w Reflex to ID Panel     Status: None   Collection Time: 10/19/16  6:52 AM  Result Value Ref Range Status   Specimen Description BLOOD RIGHT HAND  Final   Special Requests Blood Culture adequate volume  Final   Culture NO GROWTH 5 DAYS  Final   Report Status 10/24/2016 FINAL  Final  Culture, sputum-assessment     Status: None   Collection Time: 10/22/16  4:00 PM  Result Value Ref Range Status   Specimen Description EXPECTORATED SPUTUM  Final   Special Requests NONE  Final   Sputum evaluation   Final    Sputum specimen not acceptable for testing.  Please recollect.   RESULT CALLED TO, READ BACK BY AND VERIFIED WITH: GARZON,S. AT 1836 ON07/21/2018 BY EVA   Report Status 10/22/2016 FINAL  Final  Culture, expectorated sputum-assessment     Status: None   Collection Time: 10/23/16  8:45 AM  Result Value Ref Range Status   Specimen Description SPUTUM  Final   Special Requests Normal  Final   Sputum evaluation THIS SPECIMEN IS ACCEPTABLE FOR SPUTUM CULTURE  Final   Report Status 10/23/2016 FINAL  Final  Culture, respiratory (NON-Expectorated)     Status: None (Preliminary result)   Collection Time: 10/23/16  8:45 AM  Result Value Ref Range Status   Specimen Description SPUTUM  Final   Special Requests Normal Reflexed from [id number]  Final   Gram Stain   Final    NO WBC SEEN RARE SQUAMOUS EPITHELIAL CELLS PRESENT NO  ORGANISMS SEEN    Culture   Final    CULTURE REINCUBATED FOR BETTER GROWTH Performed at [facility], [address] [place], [place] [address]    Report Status PENDING  Incomplete     Labs: Basic Metabolic Panel:  Recent Labs Lab 10/20/16 0617 10/21/16 0723 10/22/16 1331 10/24/16 0754 10/25/16 0438  NA 138 138 144 140 141  K 3.4* 3.6 3.2* 3.2* 3.2*  CL 101 101 104 100* 98*  CO2 26 28 31 30  35*  GLUCOSE 106* 113* 127* 100* 85  BUN 28* 29* 30* 19 19  CREATININE 1.39* 1.23 1.27* 1.13 1.31*  CALCIUM 8.2* 8.2* 8.4* 8.3* 8.3*  MG 1.7 2.0 1.9  --  1.9   Liver Function Tests: No results for input(s): AST, ALT, ALKPHOS, BILITOT, PROT, ALBUMIN in the last 168 hours. No results for input(s): LIPASE, AMYLASE in the last 168 hours. No results for input(s): AMMONIA in the last 168 hours. CBC:  Recent Labs Lab 10/19/16 0641 10/20/16 0617 10/21/16 0723 10/22/16 1331  WBC 11.5* 9.7 10.2 10.4  HGB 12.9* 13.1 13.2 13.1  HCT 38.0* 38.4* 39.3 38.5*  MCV 94.3 93.7 94.7 94.1  PLT 60* 64* 78* 121*   Cardiac Enzymes: No results for input(s): CKTOTAL, CKMB, CKMBINDEX, TROPONINI in the last 168 hours. BNP: Invalid input(s): POCBNP CBG: No results for input(s): GLUCAP in the last 168 hours.  Time coordinating discharge:  Greater than 30 minutes  Signed:  Teeghan Hammer, DO [facility] Pager: [phone] 10/25/2016,  10:23 AM

## 2016-10-26 LAB — BASIC METABOLIC PANEL
Anion gap: 10 (ref 5–15)
BUN: 21 mg/dL — AB (ref 6–20)
CALCIUM: 8.3 mg/dL — AB (ref 8.9–10.3)
CO2: 30 mmol/L (ref 22–32)
CREATININE: 1.24 mg/dL (ref 0.61–1.24)
Chloride: 99 mmol/L — ABNORMAL LOW (ref 101–111)
GFR calc Af Amer: >60 mL/min (ref >60)
GFR, EST NON AFRICAN AMERICAN: 53 mL/min — AB (ref >60)
GLUCOSE: 96 mg/dL (ref 65–99)
Potassium: 3.3 mmol/L — ABNORMAL LOW (ref 3.5–5.1)
Sodium: 139 mmol/L (ref 135–145)

## 2016-10-26 LAB — CULTURE, RESPIRATORY: SPECIAL REQUESTS: NORMAL

## 2016-10-26 LAB — CULTURE, RESPIRATORY W GRAM STAIN: Gram Stain: NONE SEEN

## 2016-10-26 NOTE — Clinical Social Work Note (Signed)
Clinical Social Worker facilitated patient discharge including contacting patient family and facility to confirm patient discharge plans.  Clinical information faxed to facility and family agreeable with plan.  CSW [facility] [place] .  RN to call [phone] ([address] RN) for report prior to discharge.  Clinical Social Worker will sign off for now as social work intervention is no longer needed. Please consult us again if new need arises.  [place]ridget Jareli Cross, [place] [phone]

## 2016-10-26 NOTE — Discharge Summary (Signed)
Physician Discharge Summary  Jon Cross ZOX:[id number] DOB: 1933-02-21 DOA: 10/16/2016  PCP: Joette Catching, MD  Admit date: 10/16/2016 Discharge date: 10/26/2016  Admitted From: Home Disposition:  SNF  Recommendations for Outpatient Follow-up:  1. Follow up with PCP in 1-2 weeks 2. Please obtain BMP/CBC every 7 days x 5 weeks next draw on 10/31/16 3. Please check vancomycin trough on 10/28/16 and then per PharmD protocol--adjust vancomycin dosing for goal trough 15-20 4. Continue vancomycin through 11/29/16 5. Discontinue PICC line on 11/30/16 6. Speech therapy to re-evaluate patient for safest diet in one week 7. Wean oxygen for oxygen saturation greater than 92%   Discharge Condition: Stable CODE STATUS:FULL Diet recommendation: dysphagia 1 within thin liquids   Brief/Interim Summary: 83 year old male with a history of Alzheimer's dementia, CKD stage III, atrial fibrillation presented with fevers and confusion. Apparently, the patient was working outside in his yard. Later in the day, the patient was found to have altered mental status with generalized weakness. The patient was found to have temperature 100.46F with WBC 20.5 at the time of admission. Blood cultures are growing MRSA. The patient was started on vancomycin. Lumbar culture was performed on 10/18/2016 and showed WBC14 with 80% neutrophils. The patient was started on empiric acyclovir pending results of his HSV PCR. Acyclovir was discontinued a 1 stage is any PCR was negative. TEE revealed a mitral valve vegetation. Repeat blood cultures were obtained and remained negative. Unfortunately, the patient developed respiratory failure secondary to aspiration pneumonia.He was started on Unasyn. Repeat chest x-ray on 10/21/2016 showed atelectasis involving almost the entire left lung with diffuse infiltrates of the right lung. Pulmonary was consulted to assist. The patient was started on Mucomyst and Xopenex nebulizers. As his  medical condition improved, his cough was stronger, and he was able to cough up copious amounts of mucus. CT of the chest was negative for significant mucus plugging and repeat chest x-ray showed improvement of his atelectasis. Continue on Xopenex 3 times daily for another week.  Encourage incentive spirometry regularly.  Patient did not have any change in condition since discharge summary done by Dr. Arbutus Leas on 7/24. Discharge medications are unchanged. Please refer to Dr. Don Perking summary for further details.  Discharge Diagnoses:  Sepsis -Secondary to bacteremia -Lactic acid peaked 1.7 -Continue vancomycin -Fluid saline locked secondary to volume overload -leukocytosis improved -remains hemodynamically stable -sepsis physiology resolved  MRSA bacteremia -Source unclear -10/17/16 TTE--EF 50-55%, grade 1 DD, mild AI/TR, trivial pericardial effusion -Surveillance blood cultures--neg to date -Appreciatecardiology for TEE, discussed with Dr. Purvis Sheffield -placed PICC line 7/23  Native valve endocarditis -10/20/16 TEE--1.5 cm x 1.9 cm vegetation on posterior mitral leaflet, trivial MR -likely a poor candidate for any surgical intervention--case discussed with ID, Dr. Luciana Axe 7/19 -continue vancomycin through 11/29/2016 which will mark 42 days after the last negative culture -Check vancomycin trough on 10/28/2016 and thereafter per Pharm.D. protocol to maintain a vancomycin trough 15-20 -Check BMP and CBC every 7 days 5 weeks, first draw 10/31/2016  Acute respiratory failure with hypoxia -Secondary to aspiration pneumonia, pleural effusions and atelectasis of left lung -ContinueUnasyn -Stable on 2 L nasal cannula -10/24/2016--CT chest--mild enlarged mediastinal lymph nodes, bilateral upper lobe opacities, moderate bilateral pleural effusions -Wean oxygen for saturation greater than 92%  Aspiration pneumonia -continueunasyn>>>amox/clav x 3 more days after d/c to complete on week of  tx  Atelectasis of left lung -appreciatepulmonary consult -started mucomyst and xopenex -10/22/16--personally reviewed CXR--complete collapse of L-lung, R-lung infiltrates -10/24/2016 CT chest bilateral upper  lobe opacities, atelectasis LLL; mild bilateral pleural effusions   Acute metabolic encephalopathy -Secondary to infectious process -Patient has returned back to  baseline -10/18/2016 lumbar puncture cultures negative -HSV PCR negative--discontinue acyclovir  Elevated troponin -due to demand ischemia -no chest pain -personally reviewed EKG--sinus with T wave inversion V4-V6 without change  Acute on chronic renal failure--CKD stage III -Secondary to sepsis and volume depletion -Baseline creatinine 1.2-1.4 -Serum creatinine peaked at 1.78 -Serum creatinine 1.31 on the day of discharge   Dementia without behavioral disturbance -According to the patient's primary care note on 08/17/2016, he wasonNamenda and Aricept -Unclear if the patient was taking is properly -Restart Namenda 5 mg daily and Aricept 5 mg daily  - increase Namenda to 10 mg daily on 10/31/2016  - increase Aricept to 10 mg daily on 11/24/2016   B12 deficiency -Restart supplementation  Thrombocytopenia -due to sepsis -check fibrinogen--569 -stable-->trending back up with treatment of infection -am CBC  Hypokalemia -repleted -check mag--1.9  Goals of Care -palliative medicine following -full scope of treatment    Discharge Instructions  Discharge Instructions    Diet - low sodium heart healthy    Complete by:  As directed    Increase activity slowly    Complete by:  As directed    Increase activity slowly    Complete by:  As directed      Allergies as of 10/26/2016      Reactions   Dristan Cold [chlorphen-pe-acetaminophen] Nausea Only   Sulfonamide Derivatives Nausea Only      Medication List    TAKE these medications   acetaminophen 500 MG tablet Commonly known as:   TYLENOL Take 500 mg by mouth every 6 (six) hours as needed.   amoxicillin-clavulanate 875-125 MG tablet Commonly known as:  AUGMENTIN Take 1 tablet by mouth every 12 (twelve) hours.   aspirin EC 81 MG tablet Take 81 mg by mouth daily.   cyanocobalamin 500 MCG tablet Take 1 tablet (500 mcg total) by mouth daily.   donepezil 5 MG tablet Commonly known as:  ARICEPT Take 1 tablet (5 mg total) by mouth at bedtime. X 29 days   donepezil 10 MG tablet Commonly known as:  ARICEPT Take 1 tablet (10 mg total) by mouth at bedtime. Start taking on:  11/24/2016   levalbuterol 1.25 MG/0.5ML nebulizer solution Commonly known as:  XOPENEX Take 1.25 mg by nebulization 3 (three) times daily. X 1 week   memantine 5 MG tablet Commonly known as:  NAMENDA Take 1 tablet (5 mg total) by mouth daily. X 6 days What changed:  additional instructions   memantine 10 MG tablet Commonly known as:  NAMENDA Take 1 tablet (10 mg total) by mouth daily. Start taking on:  11/01/2016 What changed:  You were already taking a medication with the same name, and this prescription was added. Make sure you understand how and when to take each.   trimethoprim-polymyxin b ophthalmic solution Commonly known as:  POLYTRIM Place 1 drop into the right eye every 4 (four) hours. X 3 days   vancomycin 1-5 GM/200ML-% Soln Commonly known as:  VANCOCIN Inject 200 mLs (1,000 mg total) into the vein daily. With last day on 11/29/16      Contact information for after-discharge care    Destination    [facility] .   Specialty:  Skilled Nursing Facility Contact information: [address] [place] [address] [phone]  Allergies  Allergen Reactions  . Dristan Cold [Chlorphen-Pe-Acetaminophen] Nausea Only  . Sulfonamide Derivatives Nausea Only    Consultations:  cardiology   Procedures/Studies: Dg Chest 2 View  Result Date: 10/16/2016 CLINICAL DATA:  Altered mental  status and weakness after extended time outside. EXAM: CHEST  2 VIEW COMPARISON:  Chest radiograph June 24, 2014 FINDINGS: Cardiac silhouette is moderately enlarged. Calcified aortic knob. Similar mild chronic interstitial changes without pleural effusion or focal consolidation. Increased lung volumes. No pneumothorax. Accentuated kyphosis, chronic approximate T10 compression fracture. Osteopenia. IMPRESSION: Stable cardiomegaly and COPD. Electronically Signed   By: Awilda Metro M.D.   On: 10/16/2016 23:34   Ct Head Wo Contrast  Result Date: 10/16/2016 CLINICAL DATA:  83 year old male with altered mental status and weakness today. EXAM: CT HEAD WITHOUT CONTRAST TECHNIQUE: Contiguous axial images were obtained from the base of the skull through the vertex without intravenous contrast. COMPARISON:  None. FINDINGS: Brain: Mild cerebral atrophy. Patchy and confluent areas of decreased attenuation are noted throughout the deep and periventricular white matter of the cerebral hemispheres bilaterally, compatible with chronic microvascular ischemic disease. No evidence of acute infarction, hemorrhage, hydrocephalus, extra-axial collection or mass lesion/mass effect. Vascular: No hyperdense vessel or unexpected calcification. Skull: Normal. Negative for fracture or focal lesion. Sinuses/Orbits: Mild multifocal mucosal thickening in the ethmoid sinuses. No acute finding. Other: None. IMPRESSION: 1. No acute intracranial abnormalities. 2. Mild cerebral atrophy with mild chronic microvascular ischemic changes in the cerebral white matter. Electronically Signed   By: Trudie Reed M.D.   On: 10/16/2016 23:24   Ct Chest W Contrast  Result Date: 10/24/2016 CLINICAL DATA:  Productive cough, fever.  Atelectasis. EXAM: CT CHEST WITH CONTRAST TECHNIQUE: Multidetector CT imaging of the chest was performed during intravenous contrast administration. CONTRAST:  75mL ISOVUE-300 IOPAMIDOL (ISOVUE-300) INJECTION 61%  COMPARISON:  Radiograph of October 22, 2016. FINDINGS: Cardiovascular: There is no evidence of thoracic aortic dissection or aneurysm. Aortic atherosclerosis is noted. Coronary artery calcifications are noted. No pericardial effusion is noted. Mediastinum/Nodes: Mildly enlarged mediastinal adenopathy is noted with 1.5 cm lymph node seen in subcarinal region 14 mm right peritracheal lymph node is noted. 8 mm lymph node is noted in aortopulmonary window. Lungs/Pleura: No pneumothorax is noted. Airspace opacities are noted in both upper lobes concerning for pneumonia. Left lower lobe atelectasis or pneumonia is noted as well. Moderate bilateral pleural effusions are noted. Upper Abdomen: No acute abnormality. Musculoskeletal: No chest wall abnormality. No acute or significant osseous findings. IMPRESSION: Coronary artery calcifications are noted suggesting coronary artery disease. Bilateral pulmonary airspace opacities are noted concerning for multifocal pneumonia. Moderate bilateral pleural effusions are noted. Mildly enlarged mediastinal adenopathy is noted which most likely is inflammatory or reactive in etiology, but neoplasm cannot be excluded. Clinical correlation is recommended. Aortic Atherosclerosis (ICD10-I70.0). Electronically Signed   By: Lupita Raider, M.D.   On: 10/24/2016 09:47   Dg Chest Port 1 View  Result Date: 10/24/2016 CLINICAL DATA:  Status post PICC line placement EXAM: PORTABLE CHEST 1 VIEW COMPARISON:  10/22/2016 FINDINGS: Cardiac shadow is enlarged. Significant improved aeration on the left is noted when compare with the prior exam. Some residual effusion is noted. Patchy infiltrative changes are noted bilaterally. The PICC line is now seen with the catheter tip at the cavoatrial junction. No acute bony abnormality is noted. IMPRESSION: Multifocal pneumonia and left-sided effusion. Status post PICC line placement in satisfactory position. Electronically Signed   By: Alcide Clever M.D.   On:  10/24/2016  15:35   Dg Chest Port 1 View  Result Date: 10/22/2016 CLINICAL DATA:  Atelectasis follow-up EXAM: PORTABLE CHEST 1 VIEW COMPARISON:  10/21/2016 FINDINGS: Progressive collapse of the left lung which is now completely collapsed. Mild shift of the mediastinal structures to the left. Diffuse airspace disease in the right may represent pneumonia or edema and is unchanged. IMPRESSION: Complete collapse left lung which has progressed Diffuse right lung edema/pneumonia unchanged Electronically Signed   By: Marlan Palau M.D.   On: 10/22/2016 13:29   Dg Chest Port 1 View  Result Date: 10/21/2016 CLINICAL DATA:  Hypoxia. EXAM: PORTABLE CHEST 1 VIEW COMPARISON:  10/17/2016 . FINDINGS: Cardiomegaly is again noted. Near complete opacification of the left lung with volume loss noted. This is consistent with atelectasis. Diffuse right lung infiltrate and/or edema. Left-sided pleural effusion cannot be excluded. No pneumothorax . IMPRESSION: 1. Near complete opacification left lung with volume loss. This consistent with left lung atelectasis. Underlying infiltrate cannot be excluded. Left-sided pleural effusion cannot be excluded. 2.  Diffuse infiltrate/edema right lung. 3. Persistent cardiomegaly. Electronically Signed   By: Maisie Fus  Register   On: 10/21/2016 07:44   Dg Chest Port 1 View  Result Date: 10/17/2016 CLINICAL DATA:  83 year old male with fever.  Subsequent encounter. EXAM: PORTABLE CHEST 1 VIEW COMPARISON:  10/16/2016. FINDINGS: Cardiomegaly. Pulmonary vascular congestion. No segmental consolidation. Slightly limited evaluation of retrocardiac region. Calcified slightly tortuous aorta. IMPRESSION: Cardiomegaly. Pulmonary vascular congestion. No segmental consolidation. Slightly limited evaluation of retrocardiac region. Electronically Signed   By: Lacy Duverney M.D.   On: 10/17/2016 14:42   Dg Swallowing Func-speech Pathology  Result Date: 10/22/2016 Objective Swallowing Evaluation: Type of  Study: MBS-Modified Barium Swallow Study Patient Details Name: JOENATHAN SAKUMA MRN: [id number] Date of Birth: January 14, 1933 Today's Date: 10/22/2016 Time: SLP Start Time (ACUTE ONLY): 0800-SLP Stop Time (ACUTE ONLY): 0915 SLP Time Calculation (min) (ACUTE ONLY): 75 min Past Medical History: Past Medical History: Diagnosis Date . Alzheimer disease  . Atrial fibrillation (HCC)  . Essential hypertension, benign  . Herpes zoster  . Staphylococcal skin infection  Past Surgical History: Past Surgical History: Procedure Laterality Date . INGUINAL HERNIA REPAIR   . TEE WITHOUT CARDIOVERSION N/A 10/20/2016  Procedure: TRANSESOPHAGEAL ECHOCARDIOGRAM (TEE) WITH PROPOFOL;  Surgeon: Laqueta Linden, MD;  Location: [facility];  Service: Cardiovascular;  Laterality: N/A; . TIBIA FRACTURE SURGERY    Open reduction and internal fixation of left tibia fracture . TONSILLECTOMY   . UMBILICAL HERNIA REPAIR   HPI: Kamal Jurgens Mooreis a 83 y.o.malewith history of dementia, chronic kidney disease stage II A. fib as per the charts was brought to the ER. Patient to follow very weak and mildly confused. As per the report patient has been working in his yard today and [place] when patient was found to be weak and confused. Patient has not complained of any chest pain or shortness of breath nausea vomiting or diarrhea. Has been having persistent cough.In the ER patient is being found to have productive cough is oriented to his name only. Chest x-ray and UA are unremarkable. Patient was tachycardic and febrile with temperatures around 103F. No signs of meningitis. Patient had blood cultures drawn and since patient was having productive cough and began antibiotics for pneumonia were started. Troponin is found to be very elevated. EKG showssinus tachycardia.CT of the head was unremarkable. BSE ordered. Subjective: Pt alert and c/o right eye discomfort Assessment / Plan / Recommendation CHL IP CLINICAL IMPRESSIONS 10/22/2016  Clinical Impression Pt presents with mild oropharyngeal dysphagia and mild pharyngoesophageal dysphagia characterized by prolonged oral phase with lingual pumping and inefficient bolus prep with solids, decreased velopharyngeal closure with variable velopharyngeal regurgitation with thins, reduced tongue base retraction and pharyngeal constriction, reduced cricopharyngeal relaxation, and prominent cricopharyngeus resulting in residuals post swallow along posterior pharyngeal wall, pyriforms, and cricopharyngeus across all textures and consistencies (mild to moderate amount). Residuals were reduced with cued repeat/dry swallows (not completely eliminated). Pt also benefited from liquid wash (following puree/mech soft with sip of liquid) to reduce residuals. Pt does appear to have bony protrusion (osteophytes) near C4-5, but does not impinge greatly into pharyngeal space, but could impact pharyngeal pressure given current weakness. Pt had a single episode of trace laryngeal penetration with straw sips thin (when co-occurring velopharyngeal backflow noted) with trace amount of thins entering interarytenoid space, but was immediately expelled. Pt assessed with both thin and nectars via cup and straw sips and self presented when able. No aspiration observed, however pt with fairly consistent throat clear/soft cough after many of his swallows. Pt with prominent cricopharyngeus with reduced relaxation resulting in tail end of bolus remaining near CP and posterior pharyngeal wall. Recommend D1/puree with thin liquids, crush meds as able in puree. Straws OK. Prognosis for advancing diet textures is good with improved endurance and strength. D/W Dr. Arbutus Leasat, and pt medical status tenuous at this time. Recommend po only when alert and upright. RN to help with oral care and brush tongue. F/U SLP at SNF. SLP Visit Diagnosis Dysphagia, oropharyngeal phase (R13.12) Attention and concentration deficit following -- Frontal lobe and  executive function deficit following -- Impact on safety and function Mild aspiration risk;Moderate aspiration risk   CHL IP TREATMENT RECOMMENDATION 10/22/2016 Treatment Recommendations Therapy as outlined in treatment plan below   Prognosis 10/22/2016 Prognosis for Safe Diet Advancement Fair Barriers to Reach Goals (No Data) Barriers/Prognosis Comment -- CHL IP DIET RECOMMENDATION 10/22/2016 SLP Diet Recommendations Dysphagia 1 (Puree) solids;Thin liquid Liquid Administration via Straw;Cup Medication Administration Crushed with puree Compensations Slow rate;Multiple dry swallows after each bite/sip;Follow solids with liquid;Effortful swallow Postural Changes Remain semi-upright after after feeds/meals (Comment);Seated upright at 90 degrees   CHL IP OTHER RECOMMENDATIONS 10/22/2016 Recommended Consults -- Oral Care Recommendations Oral care BID;Staff/trained caregiver to provide oral care Other Recommendations Clarify dietary restrictions   CHL IP FOLLOW UP RECOMMENDATIONS 10/22/2016 Follow up Recommendations [facility] D[facility] 10/22/2016 Speech Therapy Frequency (ACUTE ONLY) min 2x/week Treatment Duration 1 week      CHL IP ORAL PHASE 10/22/2016 Oral Phase Impaired Oral - Pudding Teaspoon -- Oral - Pudding Cup -- Oral - Honey Teaspoon -- Oral - Honey Cup -- Oral - Nectar Teaspoon -- Oral - Nectar Cup -- Oral - Nectar Straw -- Oral - Thin Teaspoon -- Oral - Thin Cup -- Oral - Thin Straw Decreased velopharyngeal closure;Nasal reflux Oral - Puree Weak lingual manipulation;Lingual pumping;Delayed oral transit Oral - Mech Soft -- Oral - Regular -- Oral - Multi-Consistency -- Oral - Pill -- Oral Phase - Comment --  CHL IP PHARYNGEAL PHASE 10/22/2016 Pharyngeal Phase Impaired Pharyngeal- Pudding Teaspoon -- Pharyngeal -- Pharyngeal- Pudding Cup -- Pharyngeal -- Pharyngeal- Honey Teaspoon -- Pharyngeal -- Pharyngeal- Honey Cup -- Pharyngeal -- Pharyngeal- Nectar Teaspoon -- Pharyngeal --  Pharyngeal- Nectar Cup -- Pharyngeal -- Pharyngeal- Nectar Straw Pharyngeal residue - pyriform;Pharyngeal residue - posterior pharnyx;Pharyngeal residue - cp segment;Reduced tongue base retraction;Pharyngeal residue - valleculae Pharyngeal -- Pharyngeal- Thin  Teaspoon -- Pharyngeal -- Pharyngeal- Thin Cup Reduced tongue base retraction;Pharyngeal residue - cp segment;Pharyngeal residue - pyriform Pharyngeal -- Pharyngeal- Thin Straw Reduced tongue base retraction;Penetration/Aspiration during swallow;Pharyngeal residue - pyriform;Pharyngeal residue - cp segment;Nasopharyngeal reflux Pharyngeal Material enters airway, remains ABOVE vocal cords then ejected out;Material does not enter airway Pharyngeal- Puree Delayed swallow initiation-vallecula;Reduced tongue base retraction;Pharyngeal residue - valleculae;Pharyngeal residue - posterior pharnyx;Pharyngeal residue - cp segment;Reduced pharyngeal peristalsis Pharyngeal -- Pharyngeal- Mechanical Soft Reduced pharyngeal peristalsis;Reduced tongue base retraction;Pharyngeal residue - valleculae;Pharyngeal residue - cp segment Pharyngeal -- Pharyngeal- Regular -- Pharyngeal -- Pharyngeal- Multi-consistency -- Pharyngeal -- Pharyngeal- Pill NT Pharyngeal -- Pharyngeal Comment reduced CP relaxation with resultant CP/PPW residue post swallow  CHL IP CERVICAL ESOPHAGEAL PHASE 10/22/2016 Cervical Esophageal Phase Impaired Pudding Teaspoon -- Pudding Cup -- Honey Teaspoon -- Honey Cup -- Nectar Teaspoon -- Nectar Cup -- Nectar Straw Reduced cricopharyngeal relaxation;Prominent cricopharyngeal segment Thin Teaspoon -- Thin Cup -- Thin Straw Reduced cricopharyngeal relaxation;Prominent cricopharyngeal segment Puree Reduced cricopharyngeal relaxation;Prominent cricopharyngeal segment Mechanical Soft -- Regular -- Multi-consistency -- Pill -- Cervical Esophageal Comment tail of bolus remains near UES and PPW Thank you, Havery MorosDabney Porter, CCC-SLP [phone] PORTER,DABNEY 10/22/2016, 9:48  AM              Dg Fluoro Guide Lumbar Puncture  Result Date: 10/18/2016 CLINICAL DATA:  Fever EXAM: DIAGNOSTIC LUMBAR PUNCTURE UNDER FLUOROSCOPIC GUIDANCE FLUOROSCOPY TIME:  Fluoroscopy Time:  0 minutes 30 seconds Radiation Exposure Index (if provided by the fluoroscopic device): 4.7 mGy Number of Acquired Spot Images: 2 screen captures during fluoroscopy PROCEDURE: Procedure, benefits, and risks were discussed with the patient, including alternatives. Patient's questions were answered. Written informed consent was obtained by the service from the patient's son. Timeout protocol followed. Patient placed prone. L4-L5 disc space was localized under fluoroscopy. Skin prepped and draped in usual sterile fashion. Skin and soft tissues anesthetized with 2 mL of 1% lidocaine. A 22 gauge needle was advanced into the spinal canal where clear colorless CSF was encountered. 10.5 mL of CSF was obtained in 4 tubes for requested analysis. Procedure tolerated very well by patient without immediate complication. IMPRESSION: Successful fluoroscopic guided lumbar puncture as above. Electronically Signed   By: Ulyses SouthwardMark  Boles M.D.   On: 10/18/2016 11:42        Discharge Exam: Vitals:   10/26/16 0500 10/26/16 1411  BP: 134/68 (!) 142/61  Pulse: 98 85  Resp: 18 16  Temp: 98.6 F (37 C) 97.9 F (36.6 C)   Vitals:   10/25/16 2040 10/25/16 2119 10/26/16 0500 10/26/16 1411  BP:  (!) 162/68 134/68 (!) 142/61  Pulse:  75 98 85  Resp:  20 18 16   Temp:  98 F (36.7 C) 98.6 F (37 C) 97.9 F (36.6 C)  TempSrc:  Oral Oral Oral  SpO2: 98% 92% 93% 96%  Weight:      Height:        General: Pt is alert, awake, not in acute distress Cardiovascular: RRR, S1/S2 +, no rubs, no gallops Respiratory: bibasilar crackles, no wheeze Abdominal: Soft, NT, ND, bowel sounds + Extremities: no edema, no cyanosis   The results of significant diagnostics from this hospitalization (including imaging, microbiology, ancillary and  laboratory) are listed below for reference.    Significant Diagnostic Studies: Dg Chest 2 View  Result Date: 10/16/2016 CLINICAL DATA:  Altered mental status and weakness after extended time outside. EXAM: CHEST  2 VIEW COMPARISON:  Chest radiograph June 24, 2014 FINDINGS: Cardiac silhouette is  moderately enlarged. Calcified aortic knob. Similar mild chronic interstitial changes without pleural effusion or focal consolidation. Increased lung volumes. No pneumothorax. Accentuated kyphosis, chronic approximate T10 compression fracture. Osteopenia. IMPRESSION: Stable cardiomegaly and COPD. Electronically Signed   By: Awilda Metro M.D.   On: 10/16/2016 23:34   Ct Head Wo Contrast  Result Date: 10/16/2016 CLINICAL DATA:  83 year old male with altered mental status and weakness today. EXAM: CT HEAD WITHOUT CONTRAST TECHNIQUE: Contiguous axial images were obtained from the base of the skull through the vertex without intravenous contrast. COMPARISON:  None. FINDINGS: Brain: Mild cerebral atrophy. Patchy and confluent areas of decreased attenuation are noted throughout the deep and periventricular white matter of the cerebral hemispheres bilaterally, compatible with chronic microvascular ischemic disease. No evidence of acute infarction, hemorrhage, hydrocephalus, extra-axial collection or mass lesion/mass effect. Vascular: No hyperdense vessel or unexpected calcification. Skull: Normal. Negative for fracture or focal lesion. Sinuses/Orbits: Mild multifocal mucosal thickening in the ethmoid sinuses. No acute finding. Other: None. IMPRESSION: 1. No acute intracranial abnormalities. 2. Mild cerebral atrophy with mild chronic microvascular ischemic changes in the cerebral white matter. Electronically Signed   By: Trudie Reed M.D.   On: 10/16/2016 23:24   Ct Chest W Contrast  Result Date: 10/24/2016 CLINICAL DATA:  Productive cough, fever.  Atelectasis. EXAM: CT CHEST WITH CONTRAST TECHNIQUE:  Multidetector CT imaging of the chest was performed during intravenous contrast administration. CONTRAST:  75mL ISOVUE-300 IOPAMIDOL (ISOVUE-300) INJECTION 61% COMPARISON:  Radiograph of October 22, 2016. FINDINGS: Cardiovascular: There is no evidence of thoracic aortic dissection or aneurysm. Aortic atherosclerosis is noted. Coronary artery calcifications are noted. No pericardial effusion is noted. Mediastinum/Nodes: Mildly enlarged mediastinal adenopathy is noted with 1.5 cm lymph node seen in subcarinal region 14 mm right peritracheal lymph node is noted. 8 mm lymph node is noted in aortopulmonary window. Lungs/Pleura: No pneumothorax is noted. Airspace opacities are noted in both upper lobes concerning for pneumonia. Left lower lobe atelectasis or pneumonia is noted as well. Moderate bilateral pleural effusions are noted. Upper Abdomen: No acute abnormality. Musculoskeletal: No chest wall abnormality. No acute or significant osseous findings. IMPRESSION: Coronary artery calcifications are noted suggesting coronary artery disease. Bilateral pulmonary airspace opacities are noted concerning for multifocal pneumonia. Moderate bilateral pleural effusions are noted. Mildly enlarged mediastinal adenopathy is noted which most likely is inflammatory or reactive in etiology, but neoplasm cannot be excluded. Clinical correlation is recommended. Aortic Atherosclerosis (ICD10-I70.0). Electronically Signed   By: Lupita Raider, M.D.   On: 10/24/2016 09:47   Dg Chest Port 1 View  Result Date: 10/24/2016 CLINICAL DATA:  Status post PICC line placement EXAM: PORTABLE CHEST 1 VIEW COMPARISON:  10/22/2016 FINDINGS: Cardiac shadow is enlarged. Significant improved aeration on the left is noted when compare with the prior exam. Some residual effusion is noted. Patchy infiltrative changes are noted bilaterally. The PICC line is now seen with the catheter tip at the cavoatrial junction. No acute bony abnormality is noted.  IMPRESSION: Multifocal pneumonia and left-sided effusion. Status post PICC line placement in satisfactory position. Electronically Signed   By: Alcide Clever M.D.   On: 10/24/2016 15:35   Dg Chest Port 1 View  Result Date: 10/22/2016 CLINICAL DATA:  Atelectasis follow-up EXAM: PORTABLE CHEST 1 VIEW COMPARISON:  10/21/2016 FINDINGS: Progressive collapse of the left lung which is now completely collapsed. Mild shift of the mediastinal structures to the left. Diffuse airspace disease in the right may represent pneumonia or edema and is unchanged. IMPRESSION: Complete collapse  left lung which has progressed Diffuse right lung edema/pneumonia unchanged Electronically Signed   By: Marlan Palau M.D.   On: 10/22/2016 13:29   Dg Chest Port 1 View  Result Date: 10/21/2016 CLINICAL DATA:  Hypoxia. EXAM: PORTABLE CHEST 1 VIEW COMPARISON:  10/17/2016 . FINDINGS: Cardiomegaly is again noted. Near complete opacification of the left lung with volume loss noted. This is consistent with atelectasis. Diffuse right lung infiltrate and/or edema. Left-sided pleural effusion cannot be excluded. No pneumothorax . IMPRESSION: 1. Near complete opacification left lung with volume loss. This consistent with left lung atelectasis. Underlying infiltrate cannot be excluded. Left-sided pleural effusion cannot be excluded. 2.  Diffuse infiltrate/edema right lung. 3. Persistent cardiomegaly. Electronically Signed   By: Maisie Fus  Register   On: 10/21/2016 07:44   Dg Chest Port 1 View  Result Date: 10/17/2016 CLINICAL DATA:  83 year old male with fever.  Subsequent encounter. EXAM: PORTABLE CHEST 1 VIEW COMPARISON:  10/16/2016. FINDINGS: Cardiomegaly. Pulmonary vascular congestion. No segmental consolidation. Slightly limited evaluation of retrocardiac region. Calcified slightly tortuous aorta. IMPRESSION: Cardiomegaly. Pulmonary vascular congestion. No segmental consolidation. Slightly limited evaluation of retrocardiac region.  Electronically Signed   By: Lacy Duverney M.D.   On: 10/17/2016 14:42   Dg Swallowing Func-speech Pathology  Result Date: 10/22/2016 Objective Swallowing Evaluation: Type of Study: MBS-Modified Barium Swallow Study Patient Details Name: SHERMON BOZZI MRN: [id number] Date of Birth: June 12, 1932 Today's Date: 10/22/2016 Time: SLP Start Time (ACUTE ONLY): 0800-SLP Stop Time (ACUTE ONLY): 0915 SLP Time Calculation (min) (ACUTE ONLY): 75 min Past Medical History: Past Medical History: Diagnosis Date . Alzheimer disease  . Atrial fibrillation (HCC)  . Essential hypertension, benign  . Herpes zoster  . Staphylococcal skin infection  Past Surgical History: Past Surgical History: Procedure Laterality Date . INGUINAL HERNIA REPAIR   . TEE WITHOUT CARDIOVERSION N/A 10/20/2016  Procedure: TRANSESOPHAGEAL ECHOCARDIOGRAM (TEE) WITH PROPOFOL;  Surgeon: Laqueta Linden, MD;  Location: [facility];  Service: Cardiovascular;  Laterality: N/A; . TIBIA FRACTURE SURGERY    Open reduction and internal fixation of left tibia fracture . TONSILLECTOMY   . UMBILICAL HERNIA REPAIR   HPI: Keoki Mchargue Mooreis a 83 y.o.malewith history of dementia, chronic kidney disease stage II A. fib as per the charts was brought to the ER. Patient to follow very weak and mildly confused. As per the report patient has been working in his yard today and [place] when patient was found to be weak and confused. Patient has not complained of any chest pain or shortness of breath nausea vomiting or diarrhea. Has been having persistent cough.In the ER patient is being found to have productive cough is oriented to his name only. Chest x-ray and UA are unremarkable. Patient was tachycardic and febrile with temperatures around 103F. No signs of meningitis. Patient had blood cultures drawn and since patient was having productive cough and began antibiotics for pneumonia were started. Troponin is found to be very elevated. EKG showssinus  tachycardia.CT of the head was unremarkable. BSE ordered. Subjective: Pt alert and c/o right eye discomfort Assessment / Plan / Recommendation CHL IP CLINICAL IMPRESSIONS 10/22/2016 Clinical Impression Pt presents with mild oropharyngeal dysphagia and mild pharyngoesophageal dysphagia characterized by prolonged oral phase with lingual pumping and inefficient bolus prep with solids, decreased velopharyngeal closure with variable velopharyngeal regurgitation with thins, reduced tongue base retraction and pharyngeal constriction, reduced cricopharyngeal relaxation, and prominent cricopharyngeus resulting in residuals post swallow along posterior pharyngeal wall, pyriforms, and cricopharyngeus across all  textures and consistencies (mild to moderate amount). Residuals were reduced with cued repeat/dry swallows (not completely eliminated). Pt also benefited from liquid wash (following puree/mech soft with sip of liquid) to reduce residuals. Pt does appear to have bony protrusion (osteophytes) near C4-5, but does not impinge greatly into pharyngeal space, but could impact pharyngeal pressure given current weakness. Pt had a single episode of trace laryngeal penetration with straw sips thin (when co-occurring velopharyngeal backflow noted) with trace amount of thins entering interarytenoid space, but was immediately expelled. Pt assessed with both thin and nectars via cup and straw sips and self presented when able. No aspiration observed, however pt with fairly consistent throat clear/soft cough after many of his swallows. Pt with prominent cricopharyngeus with reduced relaxation resulting in tail end of bolus remaining near CP and posterior pharyngeal wall. Recommend D1/puree with thin liquids, crush meds as able in puree. Straws OK. Prognosis for advancing diet textures is good with improved endurance and strength. D/W Dr. Arbutus Leas, and pt medical status tenuous at this time. Recommend po only when alert and upright. RN to  help with oral care and brush tongue. F/U SLP at SNF. SLP Visit Diagnosis Dysphagia, oropharyngeal phase (R13.12) Attention and concentration deficit following -- Frontal lobe and executive function deficit following -- Impact on safety and function Mild aspiration risk;Moderate aspiration risk   CHL IP TREATMENT RECOMMENDATION 10/22/2016 Treatment Recommendations Therapy as outlined in treatment plan below   Prognosis 10/22/2016 Prognosis for Safe Diet Advancement Fair Barriers to Reach Goals (No Data) Barriers/Prognosis Comment -- [facility] IP DIET RECOMMENDATION 10/22/2016 SLP Diet Recommendations Dysphagia 1 (Puree) solids;Thin liquid Liquid Administration via Straw;Cup Medication Administration Crushed with puree Compensations Slow rate;Multiple dry swallows after each bite/sip;Follow solids with liquid;Effortful swallow Postural Changes Remain semi-upright after after feeds/meals (Comment);Seated upright at 90 degrees   CHL IP OTHER RECOMMENDATIONS 10/22/2016 Recommended Consults -- Oral Care Recommendations Oral care BID;Staff/trained caregiver to provide oral care Other Recommendations Clarify dietary restrictions   [facility] IP FOLLOW UP RECOMMENDATIONS 10/22/2016 Follow up Recommendations Skilled Nursing facility   [facility] IP FREQUENCY AND DURATION 10/22/2016 Speech Therapy Frequency (ACUTE ONLY) min 2x/week Treatment Duration 1 week      CHL IP ORAL PHASE 10/22/2016 Oral Phase Impaired Oral - Pudding Teaspoon -- Oral - Pudding Cup -- Oral - Honey Teaspoon -- Oral - Honey Cup -- Oral - Nectar Teaspoon -- Oral - Nectar Cup -- Oral - Nectar Straw -- Oral - Thin Teaspoon -- Oral - Thin Cup -- Oral - Thin Straw Decreased velopharyngeal closure;Nasal reflux Oral - Puree Weak lingual manipulation;Lingual pumping;Delayed oral transit Oral - Mech Soft -- Oral - Regular -- Oral - Multi-Consistency -- Oral - Pill -- Oral Phase - Comment --  CH[facility] IP PHARYNGEAL PHASE 10/22/2016 Pharyngeal Phase Impaired Pharyngeal- Pudding Teaspoon --  Pharyngeal -- Pharyngeal- Pudding Cup -- Pharyngeal -- Pharyngeal- Honey Teaspoon -- Pharyngeal -- Pharyngeal- Honey Cup -- Pharyngeal -- Pharyngeal- Nectar Teaspoon -- Pharyngeal -- Pharyngeal- Nectar Cup -- Pharyngeal -- Pharyngeal- Nectar Straw Pharyngeal residue - pyriform;Pharyngeal residue - posterior pharnyx;Pharyngeal residue - cp segment;Reduced tongue base retraction;Pharyngeal residue - valleculae Pharyngeal -- Pharyngeal- Thin Teaspoon -- Pharyngeal -- Pharyngeal- Thin Cup Reduced tongue base retraction;Pharyngeal residue - cp segment;Pharyngeal residue - pyriform Pharyngeal -- Pharyngeal- Thin Straw Reduced tongue base retraction;Penetration/Aspiration during swallow;Pharyngeal residue - pyriform;Pharyngeal residue - cp segment;Nasopharyngeal reflux Pharyngeal Material enters airway, remains ABOVE vocal cords then ejected out;Material does not enter airway Pharyngeal- Puree Delayed swallow initiation-vallecula;Reduced tongue base retraction;Pharyngeal residue -  valleculae;Pharyngeal residue - posterior pharnyx;Pharyngeal residue - cp segment;Reduced pharyngeal peristalsis Pharyngeal -- Pharyngeal- Mechanical Soft Reduced pharyngeal peristalsis;Reduced tongue base retraction;Pharyngeal residue - valleculae;Pharyngeal residue - cp segment Pharyngeal -- Pharyngeal- Regular -- Pharyngeal -- Pharyngeal- Multi-consistency -- Pharyngeal -- Pharyngeal- Pill NT Pharyngeal -- Pharyngeal Comment reduced CP relaxation with resultant CP/PPW residue post swallow  CHL IP CERVICAL ESOPHAGEAL PHASE 10/22/2016 Cervical Esophageal Phase Impaired Pudding Teaspoon -- Pudding Cup -- Honey Teaspoon -- Honey Cup -- Nectar Teaspoon -- Nectar Cup -- Nectar Straw Reduced cricopharyngeal relaxation;Prominent cricopharyngeal segment Thin Teaspoon -- Thin Cup -- Thin Straw Reduced cricopharyngeal relaxation;Prominent cricopharyngeal segment Puree Reduced cricopharyngeal relaxation;Prominent cricopharyngeal segment Mechanical Soft --  Regular -- Multi-consistency -- Pill -- Cervical Esophageal Comment tail of bolus remains near UES and PPW Thank you, Havery Moros, CCC-SLP [phone] PORTER,DABNEY 10/22/2016, 9:48 AM              Dg Fluoro Guide Lumbar Puncture  Result Date: 10/18/2016 CLINICAL DATA:  Fever EXAM: DIAGNOSTIC LUMBAR PUNCTURE UNDER FLUOROSCOPIC GUIDANCE FLUOROSCOPY TIME:  Fluoroscopy Time:  0 minutes 30 seconds Radiation Exposure Index (if provided by the fluoroscopic device): 4.7 mGy Number of Acquired Spot Images: 2 screen captures during fluoroscopy PROCEDURE: Procedure, benefits, and risks were discussed with the patient, including alternatives. Patient's questions were answered. Written informed consent was obtained by the service from the patient's son. Timeout protocol followed. Patient placed prone. L4-L5 disc space was localized under fluoroscopy. Skin prepped and draped in usual sterile fashion. Skin and soft tissues anesthetized with 2 mL of 1% lidocaine. A 22 gauge needle was advanced into the spinal canal where clear colorless CSF was encountered. 10.5 mL of CSF was obtained in 4 tubes for requested analysis. Procedure tolerated very well by patient without immediate complication. IMPRESSION: Successful fluoroscopic guided lumbar puncture as above. Electronically Signed   By: Ulyses Southward M.D.   On: 10/18/2016 11:42     Microbiology: Recent Results (from the past 240 hour(s))  Urine culture     Status: None   Collection Time: 10/16/16 11:13 PM  Result Value Ref Range Status   Specimen Description URINE, CLEAN CATCH  Final   Special Requests NONE  Final   Culture   Final    NO GROWTH Performed at [facility], [address] [place], [place] [address]    Report Status 10/18/2016 FINAL  Final  Blood Culture (routine x 2)     Status: Abnormal   Collection Time: 10/17/16 12:37 AM  Result Value Ref Range Status   Specimen Description BLOOD RIGHT ARM  Final   Special Requests   Final    BOTTLES  DRAWN AEROBIC AND ANAEROBIC Blood Culture adequate volume   Culture  Setup Time   Final    GRAM POSITIVE COCCI Gram Stain Report Called to,Read Back By and Verified With: BIVENS,L @ 1243 ON 7.16.18 BY BOWMAN,L IN BOTH AEROBIC AND ANAEROBIC BOTTLES    Culture METHICILLIN RESISTANT STAPHYLOCOCCUS AUREUS (A)  Final   Report Status 10/19/2016 FINAL  Final   Organism ID, Bacteria METHICILLIN RESISTANT STAPHYLOCOCCUS AUREUS  Final      Susceptibility   Methicillin resistant staphylococcus aureus - MIC*    CIPROFLOXACIN <=0.5 SENSITIVE Sensitive     ERYTHROMYCIN >=8 RESISTANT Resistant     GENTAMICIN <=0.5 SENSITIVE Sensitive     OXACILLIN >=4 RESISTANT Resistant     TETRACYCLINE <=1 SENSITIVE Sensitive     VANCOMYCIN 1 SENSITIVE Sensitive     TRIMETH/SULFA <=10 SENSITIVE Sensitive  CLINDAMYCIN <=0.25 SENSITIVE Sensitive     RIFAMPIN <=0.5 SENSITIVE Sensitive     Inducible Clindamycin NEGATIVE Sensitive     * METHICILLIN RESISTANT STAPHYLOCOCCUS AUREUS  Blood Culture ID Panel (Reflexed)     Status: Abnormal   Collection Time: 10/17/16 12:37 AM  Result Value Ref Range Status   Enterococcus species NOT DETECTED NOT DETECTED Final   Listeria monocytogenes NOT DETECTED NOT DETECTED Final   Staphylococcus species DETECTED (A) NOT DETECTED Final    Comment: CRITICAL RESULT CALLED TO, READ BACK BY AND VERIFIED WITH: Hector Brunswick RN, AT 1844 10/17/16 BY D. VANHOOK    Staphylococcus aureus DETECTED (A) NOT DETECTED Final    Comment: Methicillin (oxacillin)-resistant Staphylococcus aureus (MRSA). MRSA is predictably resistant to beta-lactam antibiotics (except ceftaroline). Preferred therapy is vancomycin unless clinically contraindicated. Patient requires contact precautions if  hospitalized. CRITICAL RESULT CALLED TO, READ BACK BY AND VERIFIED WITH: L. BIVENS RN, AT 1844 10/17/16 BY D. VANHOOK    Methicillin resistance DETECTED (A) NOT DETECTED Final    Comment: CRITICAL RESULT CALLED TO, READ  BACK BY AND VERIFIED WITH: L. BIVENS RN, AT 1844 10/17/16 BY D. VANHOOK    Streptococcus species NOT DETECTED NOT DETECTED Final   Streptococcus agalactiae NOT DETECTED NOT DETECTED Final   Streptococcus pneumoniae NOT DETECTED NOT DETECTED Final   Streptococcus pyogenes NOT DETECTED NOT DETECTED Final   Acinetobacter baumannii NOT DETECTED NOT DETECTED Final   Enterobacteriaceae species NOT DETECTED NOT DETECTED Final   Enterobacter cloacae complex NOT DETECTED NOT DETECTED Final   Escherichia coli NOT DETECTED NOT DETECTED Final   Klebsiella oxytoca NOT DETECTED NOT DETECTED Final   Klebsiella pneumoniae NOT DETECTED NOT DETECTED Final   Proteus species NOT DETECTED NOT DETECTED Final   Serratia marcescens NOT DETECTED NOT DETECTED Final   Haemophilus influenzae NOT DETECTED NOT DETECTED Final   Neisseria meningitidis NOT DETECTED NOT DETECTED Final   Pseudomonas aeruginosa NOT DETECTED NOT DETECTED Final   Candida albicans NOT DETECTED NOT DETECTED Final   Candida glabrata NOT DETECTED NOT DETECTED Final   Candida krusei NOT DETECTED NOT DETECTED Final   Candida parapsilosis NOT DETECTED NOT DETECTED Final   Candida tropicalis NOT DETECTED NOT DETECTED Final    Comment: Performed at [facility], [address], [place], [place] [address]  Blood Culture (routine x 2)     Status: Abnormal   Collection Time: 10/17/16 12:46 AM  Result Value Ref Range Status   Specimen Description BLOOD LEFT ARM  Final   Special Requests   Final    BOTTLES DRAWN AEROBIC ONLY Blood Culture results may not be optimal due to an inadequate volume of blood received in culture bottles   Culture  Setup Time   Final    GRAM POSITIVE COCCI Gram Stain Report Called to,Read Back By and Verified With: BIVENS,L @ 1243 ON 7.16.18 BY BOWMAN,L AEROBIC BOTTLE ONLY    Culture (A)  Final    STAPHYLOCOCCUS AUREUS SUSCEPTIBILITIES PERFORMED ON PREVIOUS CULTURE WITHIN THE LAST 5 DAYS. Performed at [facility], [address], [place], [place] [address]    Report Status 10/19/2016 FINAL  Final  Gram stain     Status: None   Collection Time: 10/18/16 10:34 AM  Result Value Ref Range Status   Specimen Description CSF  Final   Special Requests NONE  Final   Gram Stain   Final    CYTOSPIN SMEAR WBC SEEN WBC PRESENT,BOTH PMN AND MONONUCLEAR NO  ORGANISMS SEEN   Report Status 10/18/2016 FINAL  Final  CSF culture     Status: None   Collection Time: 10/18/16 10:34 AM  Result Value Ref Range Status   Specimen Description CSF  Final   Special Requests NONE  Final   Gram Stain   Final    CYTOSPIN SMEAR NO ORGANISMS SEEN WBC SEEN WBC PRESENT,BOTH PMN AND MONONUCLEAR    Culture   Final    NO GROWTH 3 DAYS Performed at [facility], [address] [place], [place] [address]    Report Status 10/22/2016 FINAL  Final  Culture, blood (Routine X 2) w Reflex to ID Panel     Status: None   Collection Time: 10/19/16  6:41 AM  Result Value Ref Range Status   Specimen Description BLOOD RIGHT ARM  Final   Special Requests Blood Culture adequate volume  Final   Culture NO GROWTH 5 DAYS  Final   Report Status 10/24/2016 FINAL  Final  Culture, blood (Routine X 2) w Reflex to ID Panel     Status: None   Collection Time: 10/19/16  6:52 AM  Result Value Ref Range Status   Specimen Description BLOOD RIGHT HAND  Final   Special Requests Blood Culture adequate volume  Final   Culture NO GROWTH 5 DAYS  Final   Report Status 10/24/2016 FINAL  Final  Culture, sputum-assessment     Status: None   Collection Time: 10/22/16  4:00 PM  Result Value Ref Range Status   Specimen Description EXPECTORATED SPUTUM  Final   Special Requests NONE  Final   Sputum evaluation   Final    Sputum specimen not acceptable for testing.  Please recollect.   RESULT CALLED TO, READ BACK BY AND VERIFIED WITH: GARZON,S. AT 1836 ON07/21/2018 BY EVA   Report Status 10/22/2016 FINAL  Final  Culture, expectorated  sputum-assessment     Status: None   Collection Time: 10/23/16  8:45 AM  Result Value Ref Range Status   Specimen Description SPUTUM  Final   Special Requests Normal  Final   Sputum evaluation THIS SPECIMEN IS ACCEPTABLE FOR SPUTUM CULTURE  Final   Report Status 10/23/2016 FINAL  Final  Culture, respiratory (NON-Expectorated)     Status: Abnormal   Collection Time: 10/23/16  8:45 AM  Result Value Ref Range Status   Specimen Description SPUTUM  Final   Special Requests Normal Reflexed from [id number]  Final   Gram Stain   Final    NO WBC SEEN RARE SQUAMOUS EPITHELIAL CELLS PRESENT NO ORGANISMS SEEN Performed at [facility], [address] [place], [place] [address]    Culture MULTIPLE ORGANISMS PRESENT, NONE PREDOMINANT (A)  Final   Report Status 10/26/2016 FINAL  Final     Labs: Basic Metabolic Panel:  Recent Labs Lab 10/20/16 0617 10/21/16 0723 10/22/16 1331 10/24/16 0754 10/25/16 0438 10/26/16 0520  NA 138 138 144 140 141 139  K 3.4* 3.6 3.2* 3.2* 3.2* 3.3*  CL 101 101 104 100* 98* 99*  CO2 26 28 31 30  35* 30  GLUCOSE 106* 113* 127* 100* 85 96  BUN 28* 29* 30* 19 19 21*  CREATININE 1.39* 1.23 1.27* 1.13 1.31* 1.24  CALCIUM 8.2* 8.2* 8.4* 8.3* 8.3* 8.3*  MG 1.7 2.0 1.9  --  1.9  --    Liver Function Tests: No results for input(s): AST, ALT, ALKPHOS, BILITOT, PROT, ALBUMIN in the last 168 hours. No results for input(s): LIPASE,  AMYLASE in the last 168 hours. No results for input(s): AMMONIA in the last 168 hours. CBC:  Recent Labs Lab 10/20/16 0617 10/21/16 0723 10/22/16 1331  WBC 9.7 10.2 10.4  HGB 13.1 13.2 13.1  HCT 38.4* 39.3 38.5*  MCV 93.7 94.7 94.1  PLT 64* 78* 121*   Cardiac Enzymes: No results for input(s): CKTOTAL, CKMB, CKMBINDEX, TROPONINI in the last 168 hours. BNP: Invalid input(s): POCBNP CBG: No results for input(s): GLUCAP in the last 168 hours.  Time coordinating discharge:  Greater than 30 minutes  Signed:  Erick Blinks,  MD [facility] Pager: [phone] 10/26/2016, 2:58 PM

## 2016-10-26 NOTE — ACP (Advance Care Planning) (Signed)
[name] [phone] lives in [place]. He tells me that he has his father's healthcare power of attorney and advanced directives/living will in a safe at his home. [name] is in [place] at this time. [name] states that when he returns home Thursday 7/26 he will review his father's paperwork, and contact the hospital regarding advanced directives.

## 2016-10-26 NOTE — Progress Notes (Signed)
Daily Progress Note   Patient Name: Jon Cross       Date: 10/26/2016 DOB: 1932-11-25  Age: 83 y.o. MRN#: [id number] Attending Physician: Erick Blinks, MD Primary Care Physician: Joette Catching, MD Admit Date: 10/16/2016  Reason for Consultation/Follow-up: Establishing goals of care and Psychosocial/spiritual support  Subjective: Jon Cross is resting quietly in bed. He greets me making but not keeping eye contact as I enter. He is calm and cooperative, but pleasantly confused. He continues to ask for his sister-in-law with him he lives, Jon Cross.  There is no family of bedside at this time. Jon Cross son is in [place], plans to return to his home in [place] Cross Thursday. Jon Cross continues to improve physically and mentally daily, but does have middle stages of dementia. No additional needs at this time, PMT signoff.  Length of Stay: 9  Current Medications: Scheduled Meds:  . amoxicillin-clavulanate  1 tablet Oral Q12H  . aspirin EC  81 mg Oral Daily  . chlorhexidine  15 mL Mouth Rinse BID  . levalbuterol  1.25 mg Nebulization TID  . mouth rinse  15 mL Mouth Rinse q12n4p  . potassium chloride  40 mEq Oral Daily  . trimethoprim-polymyxin b  1 drop Right Eye Q4H  . vitamin B-12  500 mcg Oral Daily    Continuous Infusions: . sodium chloride Stopped (10/25/16 1303)  . vancomycin Stopped (10/26/16 0736)    PRN Meds: acetaminophen **OR** acetaminophen  Physical Exam  Constitutional: No distress.  HENT:  Head: Atraumatic.  Eyes:  Right eye continues to be inflamed  Cardiovascular: Normal rate and regular rhythm.   Pulmonary/Chest: Effort normal. No respiratory distress.  Abdominal: Soft. He exhibits no distension.  Musculoskeletal: He exhibits no edema.  Neurological:  He is alert.  Oriented to self only  Skin: Skin is warm and dry.  Nursing note and vitals reviewed.           Vital Signs: BP 134/68 (BP Location: Left Arm)   Pulse 98   Temp 98.6 F (37 C) (Oral)   Resp 18   Ht 5\' 6"  (1.676 m)   Wt 59.4 kg (130 lb 14.4 oz)   SpO2 93%   BMI 21.13 kg/m  SpO2: SpO2: 93 % O2 Device: O2 Device: Not Delivered O2 Flow Rate: O2 Flow Rate (L/min):  3 L/min  Intake/output summary:  Intake/Output Summary (Last 24 hours) at 10/26/16 1337 Last data filed at 10/26/16 1200  Gross per 24 hour  Intake             1040 ml  Output              700 ml  Net              340 ml   LBM: Last BM Date: 10/25/16 Baseline Weight: Weight: 65.8 kg (145 lb) Most recent weight: Weight: 59.4 kg (130 lb 14.4 oz)       Palliative Assessment/Data:    Flowsheet Rows     Most Recent Value  Intake Tab  Referral Department  Hospitalist  Unit at Time of Referral  Med/Surg Unit  Palliative Care Primary Diagnosis  Sepsis/Infectious Disease  Date Notified  10/17/16  Palliative Care Type  New Palliative care  Reason for referral  Clarify Goals of Care  Date of Admission  10/16/16  Date first seen by Palliative Care  10/18/16  # of days Palliative referral response time  1 Day(s)  # of days IP prior to Palliative referral  1  Clinical Assessment  Palliative Performance Scale Score  20%  Pain Max last 24 hours  Not able to report  Pain Min Last 24 hours  Not able to report  Dyspnea Max Last 24 Hours  Not able to report  Dyspnea Min Last 24 hours  Not able to report  Psychosocial & Spiritual Assessment  Palliative Care Outcomes  Patient/Family meeting held?  No [Patient is confused, no family at bedside]  Palliative Care Outcomes  Provided psychosocial or spiritual support  Palliative Care follow-up planned  Yes, Facility      Patient Active Problem List   Diagnosis Date Noted  . Aspiration pneumonia of both upper lobes due to gastric secretions (HCC) 10/24/2016    . Acute respiratory failure with hypoxia (HCC) 10/21/2016  . Aspiration pneumonia of both lower lobes due to gastric secretions (HCC) 10/21/2016  . 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 MRSA bacteremia 10/19/2016  . Acute renal failure superimposed Cross stage 3 chronic kidney disease (HCC) 10/19/2016  . Dementia without behavioral disturbance 10/19/2016  . Acute metabolic encephalopathy 10/18/2016  . Palliative care encounter   . Goals of care, counseling/discussion   . DNR (do not resuscitate) discussion   . Sepsis due to methicillin resistant Staphylococcus aureus (MRSA) (HCC) 10/17/2016  . Abnormal ECG 08/23/2012  . Essential hypertension, benign 12/11/2008  . PREMATURE ATRIAL CONTRACTIONS 12/11/2008  . CARDIAC MURMUR 12/11/2008    Palliative Care Assessment & Plan   Patient Profile: 83 y.o.malewith past medical history of Alzheimer's disease, atrial fibrillation, hypertension, staphylococcal skin infection history, herpes zoster infection historyadmitted Cross 7/15/2018with SI RSS, found to have bacteremia  Assessment: bacteremia;Jon Cross is being treated with IV antibiotics. TE showed vegetation and he has also been diagnosed with endocarditis. He was being treated with IV acyclovir empiricallystatus post spinal tap.CSF cultures were negative and acyclovir discontinued.. Chest CT shows consolidation, possible aspiration. Improvement and respiratory status daily.  Recommendations/Plan: at this point continue to treat the treatable. Son [name] gives his consent for any and all procedures including life-saving measures of CPR and intubation for his father.  [facility] [place] pending insurance approval for rehab. [name] states that he is considering residential placement.  Goals of Care and Additional Recommendations:  Limitations Cross Scope of Treatment: Full Scope Treatment  Code Status:  Code Status Orders        Start     Ordered   10/17/16 0410  Full code   Continuous     10/17/16 0410    Code Status History    Date Active Date Inactive Code Status Order ID Comments User Context   This patient has a current code status but no historical code status.    Advance Directive Documentation     Most Recent Value  Type of Advance Directive  Healthcare Power of Attorney, Living will  Pre-existing out of facility DNR order (yellow form or pink MOST form)  -  "MOST" Form in Place?  -       Prognosis:   Unable to determine based Cross outcomes. 6months or less would not be surprising if Jon Cross does not have resolution of his encephalopathy. This is based Cross bacteremia, and middle stages of dementia.  [facility] Planning:  [facility] [place] [facility], possible residential placement.  Care plan was discussed with Nursing staff, case manager, social worker, and Dr. Trecia RogersMomon Cross next rounds.  Thank you for allowing the Palliative Medicine Team to assist in the care of this patient.   Time In: 1120 Time Out: 1135 Total Time 15 minutes Prolonged Time Billed  no       Greater than 50%  of this time was spent counseling and coordinating care related to the above assessment and plan.  Katheran Aweasha A Chenoa Luddy, NP  Please contact Palliative Medicine Team phone at [phone] for questions and concerns.

## 2016-10-26 NOTE — Progress Notes (Signed)
[facility] [place]. Patient discharged with PICC line via orders. All paperwork and prescriptions sent via transport [facility].  Genelle Balameron D Alecxander Mainwaring, RN

## 2016-12-22 ENCOUNTER — Inpatient Hospital Stay (HOSPITAL_COMMUNITY): Payer: Medicare HMO

## 2016-12-22 ENCOUNTER — Inpatient Hospital Stay (HOSPITAL_COMMUNITY)
Admission: AD | Admit: 2016-12-22 | Discharge: 2017-01-02 | DRG: 288 | Disposition: E | Payer: Medicare HMO | Source: Other Acute Inpatient Hospital | Attending: Family Medicine | Admitting: Family Medicine

## 2016-12-22 DIAGNOSIS — I330 Acute and subacute infective endocarditis: Secondary | ICD-10-CM | POA: Diagnosis present

## 2016-12-22 DIAGNOSIS — Z66 Do not resuscitate: Secondary | ICD-10-CM | POA: Diagnosis not present

## 2016-12-22 DIAGNOSIS — I10 Essential (primary) hypertension: Secondary | ICD-10-CM | POA: Diagnosis present

## 2016-12-22 DIAGNOSIS — J9601 Acute respiratory failure with hypoxia: Secondary | ICD-10-CM | POA: Diagnosis present

## 2016-12-22 DIAGNOSIS — R0902 Hypoxemia: Secondary | ICD-10-CM

## 2016-12-22 DIAGNOSIS — R7881 Bacteremia: Secondary | ICD-10-CM | POA: Diagnosis not present

## 2016-12-22 DIAGNOSIS — D631 Anemia in chronic kidney disease: Secondary | ICD-10-CM | POA: Diagnosis present

## 2016-12-22 DIAGNOSIS — K219 Gastro-esophageal reflux disease without esophagitis: Secondary | ICD-10-CM

## 2016-12-22 DIAGNOSIS — F0151 Vascular dementia with behavioral disturbance: Secondary | ICD-10-CM

## 2016-12-22 DIAGNOSIS — Z7189 Other specified counseling: Secondary | ICD-10-CM | POA: Diagnosis not present

## 2016-12-22 DIAGNOSIS — G309 Alzheimer's disease, unspecified: Secondary | ICD-10-CM | POA: Diagnosis present

## 2016-12-22 DIAGNOSIS — Z882 Allergy status to sulfonamides status: Secondary | ICD-10-CM

## 2016-12-22 DIAGNOSIS — R0603 Acute respiratory distress: Secondary | ICD-10-CM

## 2016-12-22 DIAGNOSIS — E860 Dehydration: Secondary | ICD-10-CM | POA: Diagnosis present

## 2016-12-22 DIAGNOSIS — G9341 Metabolic encephalopathy: Secondary | ICD-10-CM | POA: Diagnosis not present

## 2016-12-22 DIAGNOSIS — I319 Disease of pericardium, unspecified: Secondary | ICD-10-CM | POA: Diagnosis present

## 2016-12-22 DIAGNOSIS — F17210 Nicotine dependence, cigarettes, uncomplicated: Secondary | ICD-10-CM | POA: Diagnosis present

## 2016-12-22 DIAGNOSIS — F0150 Vascular dementia without behavioral disturbance: Secondary | ICD-10-CM | POA: Diagnosis not present

## 2016-12-22 DIAGNOSIS — A4902 Methicillin resistant Staphylococcus aureus infection, unspecified site: Secondary | ICD-10-CM | POA: Diagnosis not present

## 2016-12-22 DIAGNOSIS — Z7982 Long term (current) use of aspirin: Secondary | ICD-10-CM

## 2016-12-22 DIAGNOSIS — Z888 Allergy status to other drugs, medicaments and biological substances status: Secondary | ICD-10-CM | POA: Diagnosis not present

## 2016-12-22 DIAGNOSIS — I058 Other rheumatic mitral valve diseases: Secondary | ICD-10-CM

## 2016-12-22 DIAGNOSIS — J189 Pneumonia, unspecified organism: Secondary | ICD-10-CM

## 2016-12-22 DIAGNOSIS — I38 Endocarditis, valve unspecified: Secondary | ICD-10-CM | POA: Diagnosis not present

## 2016-12-22 DIAGNOSIS — J9602 Acute respiratory failure with hypercapnia: Secondary | ICD-10-CM | POA: Diagnosis present

## 2016-12-22 DIAGNOSIS — E43 Unspecified severe protein-calorie malnutrition: Secondary | ICD-10-CM | POA: Diagnosis present

## 2016-12-22 DIAGNOSIS — F0280 Dementia in other diseases classified elsewhere without behavioral disturbance: Secondary | ICD-10-CM | POA: Diagnosis present

## 2016-12-22 DIAGNOSIS — Z681 Body mass index (BMI) 19 or less, adult: Secondary | ICD-10-CM

## 2016-12-22 DIAGNOSIS — N183 Chronic kidney disease, stage 3 (moderate): Secondary | ICD-10-CM

## 2016-12-22 DIAGNOSIS — E872 Acidosis: Secondary | ICD-10-CM | POA: Diagnosis present

## 2016-12-22 DIAGNOSIS — Y95 Nosocomial condition: Secondary | ICD-10-CM | POA: Diagnosis present

## 2016-12-22 DIAGNOSIS — F0390 Unspecified dementia without behavioral disturbance: Secondary | ICD-10-CM | POA: Diagnosis not present

## 2016-12-22 DIAGNOSIS — I4891 Unspecified atrial fibrillation: Secondary | ICD-10-CM | POA: Diagnosis present

## 2016-12-22 DIAGNOSIS — I129 Hypertensive chronic kidney disease with stage 1 through stage 4 chronic kidney disease, or unspecified chronic kidney disease: Secondary | ICD-10-CM | POA: Diagnosis present

## 2016-12-22 DIAGNOSIS — I479 Paroxysmal tachycardia, unspecified: Secondary | ICD-10-CM | POA: Diagnosis not present

## 2016-12-22 DIAGNOSIS — Z515 Encounter for palliative care: Secondary | ICD-10-CM | POA: Diagnosis not present

## 2016-12-22 DIAGNOSIS — G308 Other Alzheimer's disease: Secondary | ICD-10-CM | POA: Diagnosis not present

## 2016-12-22 DIAGNOSIS — B9562 Methicillin resistant Staphylococcus aureus infection as the cause of diseases classified elsewhere: Secondary | ICD-10-CM | POA: Diagnosis present

## 2016-12-22 DIAGNOSIS — R5381 Other malaise: Secondary | ICD-10-CM

## 2016-12-22 DIAGNOSIS — I471 Supraventricular tachycardia: Secondary | ICD-10-CM | POA: Diagnosis present

## 2016-12-22 LAB — BASIC METABOLIC PANEL
Anion gap: 10 (ref 5–15)
BUN: 41 mg/dL — AB (ref 6–20)
CHLORIDE: 110 mmol/L (ref 101–111)
CO2: 26 mmol/L (ref 22–32)
CREATININE: 1.9 mg/dL — AB (ref 0.61–1.24)
Calcium: 8.8 mg/dL — ABNORMAL LOW (ref 8.9–10.3)
GFR calc Af Amer: 36 mL/min — ABNORMAL LOW (ref >60)
GFR calc non Af Amer: 31 mL/min — ABNORMAL LOW (ref >60)
GLUCOSE: 106 mg/dL — AB (ref 65–99)
POTASSIUM: 3.7 mmol/L (ref 3.5–5.1)
SODIUM: 146 mmol/L — AB (ref 135–145)

## 2016-12-22 LAB — URINALYSIS, ROUTINE W REFLEX MICROSCOPIC
Bilirubin Urine: NEGATIVE
Glucose, UA: NEGATIVE mg/dL
Ketones, ur: NEGATIVE mg/dL
Nitrite: NEGATIVE
PH: 5 (ref 5.0–8.0)
PROTEIN: 100 mg/dL — AB
SPECIFIC GRAVITY, URINE: 1.019 (ref 1.005–1.030)
SQUAMOUS EPITHELIAL / LPF: NONE SEEN

## 2016-12-22 LAB — CBC WITH DIFFERENTIAL/PLATELET
Basophils Absolute: 0 10*3/uL (ref 0.0–0.1)
Basophils Relative: 0 %
EOS ABS: 0 10*3/uL (ref 0.0–0.7)
EOS PCT: 0 %
HCT: 35 % — ABNORMAL LOW (ref 39.0–52.0)
Hemoglobin: 11 g/dL — ABNORMAL LOW (ref 13.0–17.0)
LYMPHS ABS: 1 10*3/uL (ref 0.7–4.0)
LYMPHS PCT: 10 %
MCH: 31.4 pg (ref 26.0–34.0)
MCHC: 31.4 g/dL (ref 30.0–36.0)
MCV: 100 fL (ref 78.0–100.0)
MONO ABS: 0.9 10*3/uL (ref 0.1–1.0)
Monocytes Relative: 9 %
Neutro Abs: 8.3 10*3/uL — ABNORMAL HIGH (ref 1.7–7.7)
Neutrophils Relative %: 81 %
PLATELETS: 122 10*3/uL — AB (ref 150–400)
RBC: 3.5 MIL/uL — AB (ref 4.22–5.81)
RDW: 14.1 % (ref 11.5–15.5)
WBC: 10.2 10*3/uL (ref 4.0–10.5)

## 2016-12-22 LAB — PHOSPHORUS: PHOSPHORUS: 3.2 mg/dL (ref 2.5–4.6)

## 2016-12-22 LAB — PREALBUMIN: Prealbumin: 12.1 mg/dL — ABNORMAL LOW (ref 18–38)

## 2016-12-22 LAB — MAGNESIUM: Magnesium: 2.1 mg/dL (ref 1.7–2.4)

## 2016-12-22 MED ORDER — LEVALBUTEROL HCL 1.25 MG/0.5ML IN NEBU
1.2500 mg | INHALATION_SOLUTION | Freq: Three times a day (TID) | RESPIRATORY_TRACT | Status: DC
  Administered 2016-12-22 – 2016-12-26 (×13): 1.25 mg via RESPIRATORY_TRACT
  Filled 2016-12-22 (×16): qty 0.5

## 2016-12-22 MED ORDER — SODIUM CHLORIDE 0.9 % IV BOLUS (SEPSIS)
500.0000 mL | Freq: Once | INTRAVENOUS | Status: AC
  Administered 2016-12-22: 500 mL via INTRAVENOUS

## 2016-12-22 MED ORDER — METOPROLOL TARTRATE 5 MG/5ML IV SOLN
5.0000 mg | Freq: Once | INTRAVENOUS | Status: AC
Start: 2016-12-22 — End: 2016-12-22
  Administered 2016-12-22: 5 mg via INTRAVENOUS
  Filled 2016-12-22: qty 5

## 2016-12-22 MED ORDER — METOPROLOL TARTRATE 50 MG PO TABS
50.0000 mg | ORAL_TABLET | Freq: Two times a day (BID) | ORAL | Status: DC
  Administered 2016-12-22 (×2): 50 mg via ORAL
  Filled 2016-12-22 (×2): qty 1

## 2016-12-22 MED ORDER — LEVALBUTEROL HCL 1.25 MG/0.5ML IN NEBU
INHALATION_SOLUTION | RESPIRATORY_TRACT | Status: AC
  Administered 2016-12-22: 14:00:00
  Filled 2016-12-22: qty 0.5

## 2016-12-22 MED ORDER — VANCOMYCIN HCL IN DEXTROSE 1-5 GM/200ML-% IV SOLN: 1000.0000 mg | INTRAVENOUS | Status: DC

## 2016-12-22 MED ORDER — TRAMADOL HCL 50 MG PO TABS
50.0000 mg | ORAL_TABLET | Freq: Four times a day (QID) | ORAL | Status: DC | PRN
  Administered 2016-12-23 (×2): 50 mg via ORAL
  Filled 2016-12-22 (×2): qty 1

## 2016-12-22 MED ORDER — ONDANSETRON HCL 4 MG/2ML IJ SOLN: 4.0000 mg | Freq: Four times a day (QID) | INTRAMUSCULAR | Status: DC | PRN

## 2016-12-22 MED ORDER — LEVALBUTEROL HCL 1.25 MG/0.5ML IN NEBU: 1.2500 mg | INHALATION_SOLUTION | Freq: Three times a day (TID) | RESPIRATORY_TRACT | Status: DC

## 2016-12-22 MED ORDER — IPRATROPIUM-ALBUTEROL 0.5-2.5 (3) MG/3ML IN SOLN
RESPIRATORY_TRACT | Status: AC
  Filled 2016-12-22: qty 3

## 2016-12-22 MED ORDER — DONEPEZIL HCL 10 MG PO TABS
10.0000 mg | ORAL_TABLET | Freq: Every day | ORAL | Status: DC
  Administered 2016-12-22 – 2016-12-23 (×2): 10 mg via ORAL
  Filled 2016-12-22 (×3): qty 1

## 2016-12-22 MED ORDER — LEVALBUTEROL HCL 1.25 MG/0.5ML IN NEBU
INHALATION_SOLUTION | RESPIRATORY_TRACT | Status: AC
  Filled 2016-12-22: qty 0.5

## 2016-12-22 MED ORDER — METOPROLOL TARTRATE 5 MG/5ML IV SOLN: 5.0000 mg | Freq: Once | INTRAVENOUS | Status: DC

## 2016-12-22 MED ORDER — VANCOMYCIN HCL IN DEXTROSE 1-5 GM/200ML-% IV SOLN
1000.0000 mg | INTRAVENOUS | Status: DC
  Filled 2016-12-22: qty 200

## 2016-12-22 MED ORDER — ONDANSETRON HCL 4 MG PO TABS: 4.0000 mg | ORAL_TABLET | Freq: Four times a day (QID) | ORAL | Status: DC | PRN

## 2016-12-22 MED ORDER — ACETAMINOPHEN 500 MG PO TABS: 1000.0000 mg | ORAL_TABLET | Freq: Four times a day (QID) | ORAL | Status: DC | PRN

## 2016-12-22 MED ORDER — SODIUM CHLORIDE 0.45 % IV SOLN
INTRAVENOUS | Status: DC
  Administered 2016-12-22 – 2016-12-26 (×3): via INTRAVENOUS
  Administered 2016-12-26: 50 mL via INTRAVENOUS

## 2016-12-22 MED ORDER — LEVALBUTEROL HCL 0.63 MG/3ML IN NEBU
INHALATION_SOLUTION | RESPIRATORY_TRACT | Status: AC
  Filled 2016-12-22: qty 3

## 2016-12-22 MED ORDER — MEMANTINE HCL 10 MG PO TABS
10.0000 mg | ORAL_TABLET | Freq: Every day | ORAL | Status: DC
  Administered 2016-12-22 – 2016-12-23 (×2): 10 mg via ORAL
  Filled 2016-12-22: qty 2
  Filled 2016-12-22: qty 1
  Filled 2016-12-22 (×2): qty 2

## 2016-12-22 MED ORDER — PANTOPRAZOLE SODIUM 40 MG IV SOLR
40.0000 mg | INTRAVENOUS | Status: DC
  Administered 2016-12-22 – 2016-12-26 (×5): 40 mg via INTRAVENOUS
  Filled 2016-12-22 (×5): qty 40

## 2016-12-22 MED ORDER — HEPARIN SODIUM (PORCINE) 5000 UNIT/ML IJ SOLN
5000.0000 [IU] | Freq: Three times a day (TID) | INTRAMUSCULAR | Status: DC
  Administered 2016-12-22 – 2016-12-27 (×13): 5000 [IU] via SUBCUTANEOUS
  Filled 2016-12-22 (×14): qty 1

## 2016-12-22 MED ORDER — ASPIRIN EC 81 MG PO TBEC
81.0000 mg | DELAYED_RELEASE_TABLET | Freq: Every day | ORAL | Status: DC
  Administered 2016-12-22 – 2016-12-23 (×2): 81 mg via ORAL
  Filled 2016-12-22 (×3): qty 1

## 2016-12-22 NOTE — Consult Note (Signed)
[facility]    Date of Admission:  12/09/2016   Total days of antibiotics 0        Day 0 vanco                       Reason for Consult: Recurrent MRSA bacteremia    Referring Provider: Margot Ables  Assessment: MRSA bacteremia CKD3 Alzheimer's  Plan: 1. Continue vanco 2. Repeat BCx to document clearance 3. He may need a longer course of IV anbx (or could be transitioned to lifelong po anbx).  4. watch his Cr on vanco 5. palliative care eval for goals of care (son POA [phone] and niece Kanden Carey [phone])  Active Problems:   Essential hypertension, benign   Goals of care, counseling/discussion   MRSA bacteremia   Dementia without behavioral disturbance   Endocarditis of native valve   HCAP (healthcare-associated pneumonia)   Physical deconditioning   GERD (gastroesophageal reflux disease)   . aspirin EC  81 mg Oral Daily  . donepezil  10 mg Oral QHS  . heparin  5,000 Units Subcutaneous Q8H  . levalbuterol  1.25 mg Nebulization TID  . memantine  10 mg Oral Daily  . metoprolol tartrate  50 mg Oral BID  . pantoprazole (PROTONIX) IV  40 mg Intravenous Q24H    HPI: Jon Cross is a 83 y.o. male with hx alzheimer's, CKD 3, afib, of MRSA endocarditis of MV (dx [facility] and d/c SNF on 10-26-16). He completed his 6 weeks of IV vanco on 11-29-16.  He returns to [facility] on 9-16 with 24 h of fever, cough and weakness. He was found to have temp 103.6 and WBC 11.5. He was started on vanco/zosyn for concern for HCAP. His BCx 9-16 returned as positive for MRSA (Vanco MIC 1, S- Tet, S- Bactrim). He had TTE that showed mild TR/MR. No specific mention of vegetation. BCx 9-19 are ngtd. He was transferred to [facility] on 9[facility]20 for further ID and CV eval as well as palliative care.  He has been seen by CV and is not felt to need repeat TEE.    Review of Systems: Review of Systems  Reason unable to perform ROS: pt has alzheimer's.    Past  Medical History:  Diagnosis Date  . Alzheimer disease   . Atrial fibrillation (HCC)   . Essential hypertension, benign   . Herpes zoster   . Staphylococcal skin infection     Social History  Substance Use Topics  . Smoking status: Current Every Day Smoker    Packs/day: 1.00    Years: 31.00    Types: Cigarettes  . Smokeless tobacco: Never Used     Comment: quit smoking for 20 years  . Alcohol use Yes    Family History  Problem Relation Age of Onset  . Cancer Unknown    Allergies  Allergen Reactions  . Dristan Cold [Chlorphen-Pe-Acetaminophen] Nausea Only  . Sulfonamide Derivatives Nausea Only    OBJECTIVE: Blood pressure 135/81, pulse (!) 112, temperature 97.7 F (36.5 C), temperature source Axillary, resp. rate 20, weight 61.6 kg (135 lb 11.2 oz), SpO2 100 %.  Physical Exam  Constitutional: He is well-developed, well-nourished, and in no distress.  HENT:  Mouth/Throat: No oropharyngeal exudate.  Eyes: EOM are normal. No scleral icterus.  Neck: Neck supple. JVD present.  Cardiovascular:  Murmur heard.  Systolic murmur is present with a grade of 4/6  Pulmonary/Chest: He  has rhonchi.  Abdominal: Soft. Bowel sounds are normal. There is no tenderness. There is no rebound.  Musculoskeletal: He exhibits no edema.  Lymphadenopathy:    He has no cervical adenopathy.  Skin: No petechiae noted. Rash is not nodular. Nails show no clubbing.  Psychiatric:  Fatigued, did not respond to month question, place is radiology.     Lab Results Lab Results  Component Value Date   WBC 10.4 10/22/2016   HGB 13.1 10/22/2016   HCT 38.5 (L) 10/22/2016   MCV 94.1 10/22/2016   PLT 121 (L) 10/22/2016    Lab Results  Component Value Date   CREATININE 1.24 10/26/2016   BUN 21 (H) 10/26/2016   NA 139 10/26/2016   K 3.3 (L) 10/26/2016   CL 99 (L) 10/26/2016   CO2 30 10/26/2016    Lab Results  Component Value Date   ALT 32 10/18/2016   AST 49 (H) 10/18/2016   ALKPHOS 47  10/18/2016   BILITOT 0.7 10/18/2016     Microbiology: No results found for this or any previous visit (from the past 240 hour(s)).  Johny Sax, MD [facility] [phone] 12-30-16, 3:17 PM

## 2016-12-22 NOTE — Progress Notes (Signed)
[facility]  Patient Status: Active (receiving services up to time of hospitalization)  [facility] is providing the following services: RN, PT and OT  If patient discharges after hours, please call [phone].   Jon Cross 28-Dec-2016, 10:27 AM

## 2016-12-22 NOTE — Consult Note (Signed)
Cardiology Consultation:   Patient ID: Jon Cross; [id number]; 1932/06/26   Admit date: 12/07/2016 Date of Consult: 12/10/2016  Primary Care Provider: Joette Catching, MD Primary Cardiologist: Dr. Purvis Sheffield   Patient Profile:   Jon Cross is a 83 y.o. male with a hx of HTN, dementia, reported hx of afib, recent MRSA bacteremia and MV endocarditis  who is being seen today for the evaluation of possible afib rvr  at the request of Dr. Konrad Dolores.   Admitted 10/2016 @ [facility]  with weakness and confusion. Found to have MRSA bacteremia. Echo showed LVEF of 50-55%. Follow up TEE showed 1.5 x 1.9 cm vegetation seen on posterior mitral leaflet, moderate MR, trivial MR. Discharge to [facility] [place] for rehab with plan to continue IV vancomycin until 11/29/16.  Admitted 11/10/16 at [facility] acute upper GI bleed he underwent colonoscopy and EGD revealing chronic gastritis and severe duodenal inflammation without active to bleeding.  History of Present Illness:   Jon Cross admitted to [facility] to [facility] 12/19/16 for pneumonia.  He presented from home with fever and cough. Chest x-ray showed bilateral pleural effusion. Patient treated with IV Zosyn and vancomycin. Echocardiogram 12/19/16 showed LV function of 45-50%, grade 2 diastolic dysfunction, mild AI, mild mitral regurgitation, mild pulmonary hypertension. No comment on vegetation. Blood cultures remained positive. He is transferred to [facility] for possible recurrent MRSA endocarditis.   EKG:  The EKG 12/18/16 was personally reviewed and demonstrates:  Sinus tachycardia at rate of 99 bpm, PACs. New EKG pending  Telemetry:  Telemetry was personally reviewed and demonstrates:  Sinus tachy at 100-120s with frequent PACs, no atrial fibrillation,paroxysmal atrial tachycardia noted.  Past Medical History:  Diagnosis Date  . Alzheimer disease   . Atrial fibrillation (HCC)   . Essential hypertension, benign   . Herpes zoster   .  Staphylococcal skin infection     Past Surgical History:  Procedure Laterality Date  . INGUINAL HERNIA REPAIR    . TEE WITHOUT CARDIOVERSION N/A 10/20/2016   Procedure: TRANSESOPHAGEAL ECHOCARDIOGRAM (TEE) WITH PROPOFOL;  Surgeon: Laqueta Linden, MD;  Location: [facility];  Service: Cardiovascular;  Laterality: N/A;  . TIBIA FRACTURE SURGERY     Open reduction and internal fixation of left tibia fracture  . TONSILLECTOMY    . UMBILICAL HERNIA REPAIR       Home Medications:  Prior to Admission medications   Medication Sig Start Date End Date Taking? Authorizing Provider  acetaminophen (TYLENOL) 500 MG tablet Take 500 mg by mouth every 6 (six) hours as needed.    [provider]  amoxicillin-clavulanate (AUGMENTIN) 875-125 MG tablet Take 1 tablet by mouth every 12 (twelve) hours. 10/25/16   Catarina Hartshorn, MD  aspirin EC 81 MG tablet Take 81 mg by mouth daily.    [provider]  donepezil (ARICEPT) 10 MG tablet Take 1 tablet (10 mg total) by mouth at bedtime. 11/24/16 11/24/17  Catarina Hartshorn, MD  donepezil (ARICEPT) 5 MG tablet Take 1 tablet (5 mg total) by mouth at bedtime. X 29 days 10/25/16 10/25/17  Catarina Hartshorn, MD  levalbuterol Pauline Aus) 1.25 MG/0.5ML nebulizer solution Take 1.25 mg by nebulization 3 (three) times daily. X 1 week 10/25/16   Catarina Hartshorn, MD  memantine (NAMENDA) 10 MG tablet Take 1 tablet (10 mg total) by mouth daily. 11/01/16   Catarina Hartshorn, MD  memantine (NAMENDA) 5 MG tablet Take 1 tablet (5 mg total) by mouth daily. X 6 days 10/25/16  Catarina Hartshorn, MD  trimethoprim-polymyxin b (POLYTRIM) ophthalmic solution Place 1 drop into the right eye every 4 (four) hours. X 3 days 10/25/16   Catarina Hartshorn, MD  vancomycin (VANCOCIN) 1-5 GM/200ML-% SOLN Inject 200 mLs (1,000 mg total) into the vein daily. With last day on 11/29/16 10/25/16   TatOnalee Hua, MD  vitamin B-12 500 MCG tablet Take 1 tablet (500 mcg total) by mouth daily. 10/26/16   Catarina Hartshorn, MD    Inpatient  Medications:  Scheduled Meds: . metoprolol tartrate  5 mg Intravenous Once  . metoprolol tartrate  50 mg Oral BID   Continuous Infusions: PRN Meds:.   Allergies:    Allergies  Allergen Reactions  . Dristan Cold [Chlorphen-Pe-Acetaminophen] Nausea Only  . Sulfonamide Derivatives Nausea Only    Social History:   Social History   Social History  . Marital status: Single    Spouse name: N/A  . Number of children: N/A  . Years of education: N/A   Occupational History  . Not on file.   Social History Main Topics  . Smoking status: Current Every Day Smoker    Packs/day: 1.00    Years: 31.00    Types: Cigarettes  . Smokeless tobacco: Never Used     Comment: quit smoking for 20 years  . Alcohol use Yes  . Drug use: No  . Sexual activity: Not on file   Other Topics Concern  . Not on file   Social History Narrative  . No narrative on file    Family History:    Family History  Problem Relation Age of Onset  . Cancer Unknown      ROS:  Please see the history of present illness. Unable to obtain history, currently sleepy ROS All other ROS reviewed and negative.     Physical Exam/Data:   Vitals:   26-Dec-2016 0953 12/26/16 1108  BP: (!) 145/87 135/81  Pulse: 90 97  Resp: 20 (!) 27  Temp: 98.2 F (36.8 C) 97.7 F (36.5 C)  TempSrc: Axillary Axillary  SpO2: 96% 97%   No intake or output data in the 24 hours ending 26-Dec-2016 1152 There were no vitals filed for this visit. There is no height or weight on file to calculate BMI.  General:  Chronically ill appearing male HEENT: normal Lymph: no adenopathy Neck: + JVD Endocrine:  No thryomegaly Vascular: No carotid bruits; FA pulses 2+ bilaterally without bruits  Cardiac:  Irregular tachycardic, S1, S2; RRR, occasional ectopy; 2/6 holosystolic murmur  Lungs:  Diminished breath sounds with faint rales Abd: soft, nontender, no hepatomegaly  Ext: no edema Musculoskeletal:  No deformities, BUE and BLE strength  normal and equal Skin: warm and dry  Neuro:  CNs 2-12 intact, no focal abnormalities noted Psych:  Lethargic, does not really answer many questions.     Relevant CV Studies:  See above in history of present illness.  Laboratory Data:  Chemistry:  Creatinine 1.81, potassium 3.8, sodium 146 Hematology: hemoglobin 11.6, WBC 12, RBC 3.58  Radiology/Studies:  No results found.  Assessment and Plan:   1. PAT/PACs/occasional sinus tachycardia - review of rhythms showed sinus tachycardia/paroxysmal atrial tachycardia at rate of 140s with frequent PACs. IV metoprolol 5 mg x1 then 50 mg by mouthtwice a day. Uptitrate as needed.   - this is not appear to be atrial fibrillation. No anticoagulation necessary.  2. Recent MRSA endocarditis - Blood culture positive at [facility]. IV antibiotic for ID team treat as recurrent endocarditis. No need  for repeat TEE. Patient is DO NOT RESUSCITATE. Consult palliative measures. Extremely high mortality. Extensive conversations and thought process went into his previous admission and he was deemed a non-surgical candidate.  - recommend infectious disease consultation for clarification on repeat antibiotic use IV.  3. Dementia  - Currently quite lethargic  No other cardiac interventions at this time.  For questions or updates, please contact [facility] Please consult [email or web address] for contact info under Cardiology/STEMI.   Signed, Donato Schultz, MD  12-31-2016 11:52 AM

## 2016-12-22 NOTE — H&P (Signed)
History and Physical    Jon Cross:[id number] DOB: 03-Nov-1932 DOA: 2017-01-19  PCP: Joette Catching, MD Patient coming from: [facility]   Chief Complaint: Bacteremia and HCAP  HPI: Jon Cross is a 83 y.o. male with medical history significant of Dementia, Afib, HTNMRSA bacteremia and endocarditis   Patient admitted to [facility] on 12/19/2016 for treatment of cute encephalopathy likely secondary to healthcare associated pneumonia.with symptoms of cough, fever, weakness. At that time patient had a temperature of 103.6, elevated heart rate in the 20-140 range, WBC of 11.5, troponin 0.08, creatinine of 1.8 and BNP of 5200. Chest x-ray that time also showed small bilateral pleural effusions and cardiomegaly possible overlying infectious process. Initially patient started on vancomycin and Zosyn. Of note patient had previously been diagnosed with MRSA endocarditis was on a 6 week course of IV vancomycin prior to admission. Patient completed his course of IV vancomycin on 11/29/2016. Additionally patient was recently hospitalized for a GI bleed, on 11/10/2016 and underwent upper endoscopy and colonoscopy showing chronic gastritis and duodenal inflammation with no active bleeding found.  During patient's admission at [facility] patient had blood cultures that were positive again for MRSA. 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 Hospitalist physicians consulted with cardiology at [facility] who requested transfer to [facility]. Patient immediately seen by cardiology and infectious disease for discussions of treatment plans w/ likely additional consultation with palliative care for goals of care.  ED Course: NA  Review of Systems: As per HPI otherwise all other systems reviewed and are negative  Ambulatory  Status:NA  Past Medical History:  Diagnosis Date  . Alzheimer disease   . Atrial fibrillation (HCC)   . Essential hypertension, benign   . Herpes zoster   . Staphylococcal skin infection     Past Surgical History:  Procedure Laterality Date  . INGUINAL HERNIA REPAIR    . TEE WITHOUT CARDIOVERSION N/A 10/20/2016   Procedure: TRANSESOPHAGEAL ECHOCARDIOGRAM (TEE) WITH PROPOFOL;  Surgeon: Laqueta Linden, MD;  Location: [facility];  Service: Cardiovascular;  Laterality: N/A;  . TIBIA FRACTURE SURGERY     Open reduction and internal fixation of left tibia fracture  . TONSILLECTOMY    . UMBILICAL HERNIA REPAIR      Social History   Social History  . Marital status: Single    Spouse name: N/A  . Number of children: N/A  . Years of education: N/A   Occupational History  . Not on file.   Social History Main Topics  . Smoking status: Current Every Day Smoker    Packs/day: 1.00    Years: 31.00    Types: Cigarettes  . Smokeless tobacco: Never Used     Comment: quit smoking for 20 years  . Alcohol use Yes  . Drug use: No  . Sexual activity: Not on file   Other Topics Concern  . Not on file   Social History Narrative  . No narrative on file    Allergies  Allergen Reactions  . Dristan Cold [Chlorphen-Pe-Acetaminophen] Nausea Only  . Sulfonamide Derivatives Nausea Only    Family History  Problem Relation Age of Onset  . Cancer Unknown       Prior to Admission medications   Medication Sig Start Date End Date Taking? Authorizing Provider  acetaminophen (TYLENOL) 500 MG tablet Take 500 mg by  mouth every 6 (six) hours as needed.    [provider]  amoxicillin-clavulanate (AUGMENTIN) 875-125 MG tablet Take 1 tablet by mouth every 12 (twelve) hours. 10/25/16   Catarina Hartshorn, MD  aspirin EC 81 MG tablet Take 81 mg by mouth daily.    [provider]  donepezil (ARICEPT) 10 MG tablet Take 1 tablet (10 mg total) by mouth at bedtime. 11/24/16 11/24/17  Catarina Hartshorn,  MD  donepezil (ARICEPT) 5 MG tablet Take 1 tablet (5 mg total) by mouth at bedtime. X 29 days 10/25/16 10/25/17  Catarina Hartshorn, MD  levalbuterol Pauline Aus) 1.25 MG/0.5ML nebulizer solution Take 1.25 mg by nebulization 3 (three) times daily. X 1 week 10/25/16   Catarina Hartshorn, MD  memantine (NAMENDA) 10 MG tablet Take 1 tablet (10 mg total) by mouth daily. 11/01/16   Catarina Hartshorn, MD  memantine (NAMENDA) 5 MG tablet Take 1 tablet (5 mg total) by mouth daily. X 6 days 10/25/16   Catarina Hartshorn, MD  trimethoprim-polymyxin b (POLYTRIM) ophthalmic solution Place 1 drop into the right eye every 4 (four) hours. X 3 days 10/25/16   Catarina Hartshorn, MD  vancomycin (VANCOCIN) 1-5 GM/200ML-% SOLN Inject 200 mLs (1,000 mg total) into the vein daily. With last day on 11/29/16 10/25/16   TatOnalee Hua, MD  vitamin B-12 500 MCG tablet Take 1 tablet (500 mcg total) by mouth daily. 10/26/16   Catarina Hartshorn, MD    Physical Exam: Vitals:   12/08/2016 0953 12/18/2016 1108  BP: (!) 145/87 135/81  Pulse: 90 97  Resp: 20 (!) 27  Temp: 98.2 F (36.8 C) 97.7 F (36.5 C)  TempSrc: Axillary Axillary  SpO2: 96% 97%     General: Elderly and frail-appearing. Resting in bed. Eyes:  PERRL, EOMI, normal lids, iris ENT: Dry mucous members. Poor dentition. Neck:  no LAD, masses or thyromegaly Cardiovascular:  Irregularly irregular, III/VI systolic murmur. No LE edema.  Respiratory: Coarse breath sounds throughout. Audible crackles without auscultation. Increased effort. Wet sounding cough. No wheezes. Abdomen:  soft, ntnd, NABS Skin:  no rash or induration seen on limited exam Musculoskeletal: diminished tone throughout but symmetrical, good ROM, no bony abnormality Psychiatric: follows basic commands. Pleasant, AOx1 Neurologic:  CN 2-12 grossly intact, moves all extremities in coordinated fashion, sensation intact  Labs on Admission: I have personally reviewed following labs and imaging studies  CBC: No results for input(s): WBC, NEUTROABS, HGB,  HCT, MCV, PLT in the last 168 hours. Basic Metabolic Panel: No results for input(s): NA, K, CL, CO2, GLUCOSE, BUN, CREATININE, CALCIUM, MG, PHOS in the last 168 hours. GFR: CrCl cannot be calculated (Patient's most recent lab result is older than the maximum 21 days allowed.). Liver Function Tests: No results for input(s): AST, ALT, ALKPHOS, BILITOT, PROT, ALBUMIN in the last 168 hours. No results for input(s): LIPASE, AMYLASE in the last 168 hours. No results for input(s): AMMONIA in the last 168 hours. Coagulation Profile: No results for input(s): INR, PROTIME in the last 168 hours. Cardiac Enzymes: No results for input(s): CKTOTAL, CKMB, CKMBINDEX, TROPONINI in the last 168 hours. BNP (last 3 results) No results for input(s): PROBNP in the last 8760 hours. HbA1C: No results for input(s): HGBA1C in the last 72 hours. CBG: No results for input(s): GLUCAP in the last 168 hours. Lipid Profile: No results for input(s): CHOL, HDL, LDLCALC, TRIG, CHOLHDL, LDLDIRECT in the last 72 hours. Thyroid Function Tests: No results for input(s): TSH, T4TOTAL, FREET4, T3FREE, THYROIDAB in the last 72 hours.  Anemia Panel: No results for input(s): VITAMINB12, FOLATE, FERRITIN, TIBC, IRON, RETICCTPCT in the last 72 hours. Urine analysis:    Component Value Date/Time   COLORURINE YELLOW 10/16/2016 2313   APPEARANCEUR CLEAR 10/16/2016 2313   LABSPEC 1.018 10/16/2016 2313   PHURINE 7.0 10/16/2016 2313   GLUCOSEU NEGATIVE 10/16/2016 2313   HGBUR NEGATIVE 10/16/2016 2313   BILIRUBINUR NEGATIVE 10/16/2016 2313   KETONESUR NEGATIVE 10/16/2016 2313   PROTEINUR 30 (A) 10/16/2016 2313   NITRITE NEGATIVE 10/16/2016 2313   LEUKOCYTESUR NEGATIVE 10/16/2016 2313    Creatinine Clearance: CrCl cannot be calculated (Patient's most recent lab result is older than the maximum 21 days allowed.).  Sepsis Labs: (procalcitonin:4,lacticidven:4) )No results found for this or any previous visit (from the  past 240 hour(s)).   Radiological Exams on Admission: No results found.  EKG: Independently reviewed. Sinus. No ACS, tachycardia.   Assessment/Plan Active Problems:   Essential hypertension, benign   Goals of care, counseling/discussion   MRSA bacteremia   Dementia without behavioral disturbance   Endocarditis of native valve   HCAP (healthcare-associated pneumonia)   Physical deconditioning   GERD (gastroesophageal reflux disease)   Recurrent MRSA Bacteremia/endocarditis: Initially Dx on 10/16/16 and completed 6 wk course of Vanc on 11/29/16. BCX at [facility] + for MRSA again on 12/19/16. TEE on 7/19 showing 1.5/1.9cm posterior MV vegetation. Pt is a poor surgical candidate from a MVR perspective. Likely to need protracted course of IV vanc to ensure clearance.  - ID and Cardiology consults - Redraw BCX  - Continue IV Vanc - further recs per ID  HCAP: likely MRSA related. Dx at [facility] and started on Vanc/Zosyn on 12/19/16. No reports of O2 requirement.  - O2 PRN - CXR - Xopenex PRN - Continue Vanc - further recs per ID - Pulmonary toilet   Tachycardia: sinus on EKG. Intermittent rate from low 100s to 140. Suspect in part from endocarditis dehydration.  - IVF - Lopressor  x1 - Cards consult as above.   Social: pt has deconditioned quickly since initial endocarditis Dx in mid July. No w/ recurrence of infection and pneumonia. Family remains hopeful pt recovers to previous state, though aware this is not likely given his new pathology especially from a cardiac standpoint. Pt is a limited code w/ no intubation or compressions.  - Palliative care consult for GOC.   Physical deconditioning: due to severe illness which has been present for 2 months.  - PT/OT/Nutrition - prealbumin  Dementia: at baseline pt is able to perform most of his ADLs and carries on a simple but understanding conversation.  - continue Namenda and Aricept  GERD: recent h/o GI bleed w/ upper  endocscopy and colonoscopy nml other than chronic gastritis of duodenum and stomach.       DVT prophylaxis: heparin  Code Status: parital code - DNI, no compreessions  Family Communication: Daugther  Disposition Plan: pending improvement  Consults called: ID and Cards  Admission status: Inpt    MERRELL, DAVID J MD [facility]  If 7PM-7AM, please contact night-coverage [email or web address] Password [email or web address]  12/28/16, 1:37 PM

## 2016-12-22 NOTE — Progress Notes (Signed)
Pharmacy Antibiotic Note  Jon Cross is a 83 y.o. male admitted on 2017-01-08 with endocarditis and admitted from [place] where he was receiving IV Vancomycin.  Pharmacy has been consulted for continuation of his IV Vancomycin management.  Tried to interview patient - unaware of medications he received at [place].  Reviewed records from [place] and patient received last dose on 9/19 at 22:00PM.  He had a Vancomycin trough level obtained at 21:37PM on 9/19 that resulted at 9.45mcg/ml.  This was on a Vancomycin regimen of 1gm every 36 hours indicating better than expected clearance.  Will decrease the interval to every 24 hours and follow renal function and adjust his doses accordingly.  Plan: - Vancomycin 1gm IV every 24 hours. - F/u Renal function and re-dose as needed.  Weight: 135 lb 11.2 oz (61.6 kg)  Temp (24hrs), Avg:98 F (36.7 C), Min:97.7 F (36.5 C), Max:98.2 F (36.8 C)  No results for input(s): WBC, CREATININE, LATICACIDVEN, VANCOTROUGH, VANCOPEAK, VANCORANDOM, GENTTROUGH, GENTPEAK, GENTRANDOM, TOBRATROUGH, TOBRAPEAK, TOBRARND, AMIKACINPEAK, AMIKACINTROU, AMIKACIN in the last 168 hours.  CrCl cannot be calculated (Patient's most recent lab result is older than the maximum 21 days allowed.).    Allergies  Allergen Reactions  . Dristan Cold [Chlorphen-Pe-Acetaminophen] Nausea Only  . Sulfonamide Derivatives Nausea Only    Antimicrobials this admission: Vancomycin 9/20>>    Dose adjustments this admission:   Microbiology results:  Nadara Mustard, PharmD., MS Clinical Pharmacist Pager:  [phone] Thank you for allowing pharmacy to be part of this patients care team. 01-08-2017 2:54 PM

## 2016-12-22 NOTE — Progress Notes (Signed)
Paged MD Merrel when patient arrived via EMS from [place].    MD Merrel notified of heart rate ~100 - 140. Order for EKG and IV lopressor. Will continue to monitor the patient closely.   Sheppard Evens RN

## 2016-12-23 DIAGNOSIS — F0150 Vascular dementia without behavioral disturbance: Secondary | ICD-10-CM

## 2016-12-23 DIAGNOSIS — I479 Paroxysmal tachycardia, unspecified: Secondary | ICD-10-CM

## 2016-12-23 DIAGNOSIS — Z7189 Other specified counseling: Secondary | ICD-10-CM

## 2016-12-23 DIAGNOSIS — Z515 Encounter for palliative care: Secondary | ICD-10-CM

## 2016-12-23 DIAGNOSIS — G308 Other Alzheimer's disease: Secondary | ICD-10-CM

## 2016-12-23 LAB — BASIC METABOLIC PANEL
Anion gap: 9 (ref 5–15)
BUN: 39 mg/dL — ABNORMAL HIGH (ref 6–20)
CO2: 26 mmol/L (ref 22–32)
Calcium: 8.7 mg/dL — ABNORMAL LOW (ref 8.9–10.3)
Chloride: 110 mmol/L (ref 101–111)
Creatinine, Ser: 1.78 mg/dL — ABNORMAL HIGH (ref 0.61–1.24)
GFR calc Af Amer: 39 mL/min — ABNORMAL LOW (ref >60)
GFR, EST NON AFRICAN AMERICAN: 34 mL/min — AB (ref >60)
GLUCOSE: 109 mg/dL — AB (ref 65–99)
Potassium: 3.6 mmol/L (ref 3.5–5.1)
SODIUM: 145 mmol/L (ref 135–145)

## 2016-12-23 LAB — CBC
HCT: 35 % — ABNORMAL LOW (ref 39.0–52.0)
HEMOGLOBIN: 10.9 g/dL — AB (ref 13.0–17.0)
MCH: 31.4 pg (ref 26.0–34.0)
MCHC: 31.1 g/dL (ref 30.0–36.0)
MCV: 100.9 fL — ABNORMAL HIGH (ref 78.0–100.0)
Platelets: 130 10*3/uL — ABNORMAL LOW (ref 150–400)
RBC: 3.47 MIL/uL — ABNORMAL LOW (ref 4.22–5.81)
RDW: 14.2 % (ref 11.5–15.5)
WBC: 11.2 10*3/uL — ABNORMAL HIGH (ref 4.0–10.5)

## 2016-12-23 MED ORDER — ENSURE ENLIVE PO LIQD
237.0000 mL | Freq: Three times a day (TID) | ORAL | Status: DC
  Administered 2016-12-23 – 2016-12-26 (×4): 237 mL via ORAL

## 2016-12-23 MED ORDER — HALOPERIDOL 0.5 MG PO TABS
1.0000 mg | ORAL_TABLET | Freq: Four times a day (QID) | ORAL | Status: DC | PRN
  Administered 2016-12-23 – 2016-12-24 (×2): 1 mg via ORAL
  Filled 2016-12-23 (×3): qty 1

## 2016-12-23 MED ORDER — VANCOMYCIN HCL IN DEXTROSE 1-5 GM/200ML-% IV SOLN
1000.0000 mg | INTRAVENOUS | Status: DC
  Administered 2016-12-23 – 2016-12-26 (×4): 1000 mg via INTRAVENOUS
  Filled 2016-12-23 (×4): qty 200

## 2016-12-23 MED ORDER — METOPROLOL TARTRATE 50 MG PO TABS
50.0000 mg | ORAL_TABLET | Freq: Four times a day (QID) | ORAL | Status: DC
  Administered 2016-12-23 (×4): 50 mg via ORAL
  Filled 2016-12-23 (×10): qty 1

## 2016-12-23 MED ORDER — LORAZEPAM 2 MG/ML IJ SOLN
1.0000 mg | Freq: Once | INTRAMUSCULAR | Status: AC
  Administered 2016-12-23: 1 mg via INTRAVENOUS
  Filled 2016-12-23: qty 1

## 2016-12-23 MED ORDER — HALOPERIDOL LACTATE 5 MG/ML IJ SOLN
1.0000 mg | Freq: Four times a day (QID) | INTRAMUSCULAR | Status: DC | PRN
  Administered 2016-12-24 – 2016-12-26 (×4): 1 mg via INTRAMUSCULAR
  Filled 2016-12-23 (×5): qty 1

## 2016-12-23 NOTE — Evaluation (Signed)
Physical Therapy Evaluation Patient Details Name: Jon Cross MRN: [id number] DOB: 1933/03/20 Today's Date: 12/23/2016   History of Present Illness  Patient is a 83 y/o male transferred from [place] presents with recurring MRSA bacteremia endocarditis. CXR- small bilateral pleural effusions and cardiomegaly possible overlying infectious process. PMH includes Dementia, Afib, HTN, MRSA bacteremia and endocarditis   Clinical Impression  Patient presents with generalized weakness, elevated HR, impaired balance, decreased activity tolerance and impaired mobility s/p above. Pt difficult to understand at times and not able to provide necessary information for PLOF/home setup. Tolerated standing and taking a few steps to get to chair with Mod A for balance/safety. HR up to 150 bpm during session but did not sustain. Would benefit from ST SNF to maximize independence and mobility prior to return home. Will follow acutely.    Follow Up Recommendations SNF    Equipment Recommendations  None recommended by PT    Recommendations for Other Services OT consult     Precautions / Restrictions Precautions Precautions: Fall Precaution Comments: watch HR Restrictions Weight Bearing Restrictions: No      Mobility  Bed Mobility Overal bed mobility: Needs Assistance Bed Mobility: Rolling;Sidelying to Sit Rolling: Min guard Sidelying to sit: Min guard;HOB elevated       General bed mobility comments: INcreased time and effort to get to EOB with use of rail but no assist needed.   Transfers Overall transfer level: Needs assistance Equipment used: Rolling walker (2 wheeled) Transfers: Sit to/from Stand Sit to Stand: Min assist         General transfer comment: Assist to power to standing from EOB. Bil knee instability. manual cues to place hands on RW for support.   Ambulation/Gait Ambulation/Gait assistance: Mod assist Ambulation Distance (Feet): 3 Feet Assistive device: Rolling walker  (2 wheeled) Gait Pattern/deviations: Step-to pattern;Trunk flexed;Leaning posteriorly Gait velocity: decreased   General Gait Details: Able to take a few steps to get to chair with Mod A for balance/RW management.   Stairs            Wheelchair Mobility    Modified Rankin (Stroke Patients Only)       Balance Overall balance assessment: Needs assistance Sitting-balance support: Feet supported;Bilateral upper extremity supported Sitting balance-Leahy Scale: Fair Sitting balance - Comments: ABle to sit with BUE support on bed.    Standing balance support: During functional activity;Bilateral upper extremity supported Standing balance-Leahy Scale: Poor Standing balance comment: Reliant on BUEs for support in standing and min-MOd A.                             Pertinent Vitals/Pain Pain Assessment: Faces Faces Pain Scale: Hurts even more Pain Location: when trying to urinate Pain Descriptors / Indicators: Moaning Pain Intervention(s): Monitored during session    Home Living Family/patient expects to be discharged to:: Private residence Living Arrangements: Spouse/significant other Available Help at Discharge: Family Type of Home: House Home Access: Level entry     Home Layout: One level Home Equipment: Cane - single point;Walker - 2 wheels      Prior Function Level of Independence: Needs assistance;Independent with assistive device(s)   Gait / Transfers Assistance Needed: limited community amb with SPC, reportedly very active prior to first diagnosis with bacteremia ~ 1 month ago. Went to SNF I believe.     Comments: Not able to understand pt at times. Above info is taken from admission 1 month ago.  Hand Dominance        Extremity/Trunk Assessment   Upper Extremity Assessment Upper Extremity Assessment: Defer to OT evaluation    Lower Extremity Assessment Lower Extremity Assessment: Generalized weakness    Cervical / Trunk  Assessment Cervical / Trunk Assessment: Kyphotic  Communication   Communication: HOH  Cognition Arousal/Alertness: Awake/alert Behavior During Therapy: Anxious Overall Cognitive Status: No family/caregiver present to determine baseline cognitive functioning                                 General Comments: Pt difficult to understand at times. Repeatedly saying "I'm weak" Hx of dementia- not sure how advanced as per notes from 1 month ago pt pretty active.      General Comments General comments (skin integrity, edema, etc.): HR up to 150 bpm during session esp when getting worked up.    Exercises     Assessment/Plan    PT Assessment Patient needs continued PT services  PT Problem List Decreased strength;Decreased mobility;Decreased safety awareness;Decreased activity tolerance;Cardiopulmonary status limiting activity;Decreased cognition;Decreased balance;Decreased knowledge of use of DME;Pain       PT Treatment Interventions Therapeutic activities;Gait training;Therapeutic exercise;Patient/family education;Balance training;Functional mobility training    PT Goals (Current goals can be found in the Care Plan section)  Acute Rehab PT Goals Patient Stated Goal: none stated PT Goal Formulation: Patient unable to participate in goal setting Time For Goal Achievement: 01/06/17 Potential to Achieve Goals: Fair    Frequency Min 2X/week   Barriers to discharge Decreased caregiver support      Co-evaluation               AM-PAC PT "6 Clicks" Daily Activity  Outcome Measure Difficulty turning over in bed (including adjusting bedclothes, sheets and blankets)?: None Difficulty moving from lying on back to sitting on the side of the bed? : None Difficulty sitting down on and standing up from a chair with arms (e.g., wheelchair, bedside commode, etc,.)?: Unable Help needed moving to and from a bed to chair (including a wheelchair)?: A Lot Help needed walking in  hospital room?: A Lot Help needed climbing 3-5 steps with a railing? : Total 6 Click Score: 14    End of Session Equipment Utilized During Treatment: Gait belt;Oxygen Activity Tolerance: Treatment limited secondary to medical complications (Comment) (elevated HR) Patient left: in chair;with call bell/phone within reach;with chair alarm set Nurse Communication: Mobility status PT Visit Diagnosis: Unsteadiness on feet (R26.81);Muscle weakness (generalized) (M62.81);Difficulty in walking, not elsewhere classified (R26.2)    Time: 1610-9604 PT Time Calculation (min) (ACUTE ONLY): 23 min   Charges:   PT Evaluation $PT Eval Moderate Complexity: 1 Mod PT Treatments $Therapeutic Activity: 8-22 mins   PT G Codes:        Mylo Red, PT, DPT [phone]    Blake Divine A [place] Rios 12/23/2016, 9:21 AM

## 2016-12-23 NOTE — Progress Notes (Addendum)
Progress Note  Patient Name: Jon Cross Date of Encounter: 12/23/2016  Primary Cardiologist: Purvis Sheffield  Subjective   Baseline Alzheimer's. No chest pain, no shortness of breath  Inpatient Medications    Scheduled Meds: . aspirin EC  81 mg Oral Daily  . donepezil  10 mg Oral QHS  . heparin  5,000 Units Subcutaneous Q8H  . levalbuterol  1.25 mg Nebulization TID  . memantine  10 mg Oral Daily  . metoprolol tartrate  50 mg Oral QID  . pantoprazole (PROTONIX) IV  40 mg Intravenous Q24H   Continuous Infusions: . sodium chloride 75 mL/hr at 12/23/16 0831  . vancomycin     PRN Meds: acetaminophen, ondansetron **OR** ondansetron (ZOFRAN) IV, traMADol   Vital Signs    Vitals:   01/09/17 2015 09-Jan-2017 2100 12/23/16 0537 12/23/16 0737  BP:  135/79 (!) 153/96   Pulse:  (!) 108 (!) 124   Resp:  (!) 21 (!) 21   Temp:   (!) 97.4 F (36.3 C)   TempSrc:   Axillary   SpO2: 99% 99% 95% 93%  Weight:   132 lb (59.9 kg)   Height:        Intake/Output Summary (Last 24 hours) at 12/23/16 0912 Last data filed at 12/23/16 0600  Gross per 24 hour  Intake           1377.5 ml  Output                0 ml  Net           1377.5 ml   Filed Weights   Jan 09, 2017 1423 01/09/17 1439 12/23/16 0537  Weight: 135 lb 11.2 oz (61.6 kg) 135 lb 11.2 oz (61.6 kg) 132 lb (59.9 kg)    Telemetry    He is still having periods of rapid heart rate into the 140s withwhat appears to be paroxysmal atrial tachycardia, burst of frequent PACs - Personally Reviewed  ECG    Sinus rhythm with PACs - Personally Reviewed  Physical Exam   GEN: non-communicative in chair.   Neck: No JVD Cardiac: RRRwith occasional ectopy, no murmurs, rubs, or gallops.  Respiratory: Clear to auscultation bilaterally. GI: Soft, nontender, non-distended  MS: No edema; No deformity. Neuro:  Nonfocal  Psych: baseline Alzheimer's, confused   Labs    Chemistry Recent Labs Lab 01/09/2017 1426 12/23/16 0440  NA 146* 145    K 3.7 3.6  CL 110 110  CO2 26 26  GLUCOSE 106* 109*  BUN 41* 39*  CREATININE 1.90* 1.78*  CALCIUM 8.8* 8.7*  GFRNONAA 31* 34*  GFRAA 36* 39*  ANIONGAP 10 9     Hematology Recent Labs Lab 01-09-17 1426 12/23/16 0440  WBC 10.2 11.2*  RBC 3.50* 3.47*  HGB 11.0* 10.9*  HCT 35.0* 35.0*  MCV 100.0 100.9*  MCH 31.4 31.4  MCHC 31.4 31.1  RDW 14.1 14.2  PLT 122* 130*    Cardiac EnzymesNo results for input(s): TROPONINI in the last 168 hours. No results for input(s): TROPIPOC in the last 168 hours.   BNPNo results for input(s): BNP, PROBNP in the last 168 hours.   DDimer No results for input(s): DDIMER in the last 168 hours.   Radiology    Dg Chest 2 View  Result Date: 09-Jan-2017 CLINICAL DATA:  Lethargy, atrial fibrillation, hypertension, pneumonia EXAM: CHEST  2 VIEW COMPARISON:  12/18/2016 FINDINGS: Cardiomegaly evident with worsening diffuse bilateral airspace process versus edema. Superimposed CHF is favored. Developing effusions noted  layering posteriorly. Dense left lower lobe retrocardiac consolidation persist. No pneumothorax. Aorta is atherosclerotic and tortuous. Trachea is midline. Bones are osteopenic. Degenerative changes of the spine. Increased thoracic kyphosis noted. Mild lower thoracic compression fracture, age indeterminate. IMPRESSION: Cardiomegaly with worsening diffuse airspace process versus edema and pleural effusions. CHF is favored. Dense left lower lobe collapse/consolidation. Pneumonia not excluded. Electronically Signed   By: Judie Petit.  Shick M.D.   On: 12/14/2016 18:27    Cardiac Studies   mitral valve endocarditis  Patient Profile     83 y.o. male with MRSA bacteremia mitral valve endocarditis with paroxysmal atrial tachycardia, progressed Alzheimer's dementia, recurrent positive blood cultures despite IV therapy previously.  Assessment & Plan    Paroxysmal atrial tachycardia  - I will increased his metoprolol from 50 mg twice a day to 50 mg 4 times  a dayto see if we help suppress these atrial arrhythmias.  - This does not appear to be atrial fibrillation on monitor.  - Frequent PACs noted  Bacterial endocarditis  - ID note reviewed.  - Antibiotics per their direction  - nonsurgical candidate.  Alzheimer's dementia  - Quite significant.    For questions or updates, please contact [facility] Please consult [email or web address] for contact info under Cardiology/STEMI.      Signed, Donato Schultz, MD  12/23/2016, 9:12 AM

## 2016-12-23 NOTE — Progress Notes (Signed)
Initial Nutrition Assessment  DOCUMENTATION CODES:   Severe malnutrition in context of chronic illness  INTERVENTION:    Ensure Enlive po TID, each supplement provides 350 kcal and 20 grams of protein  NUTRITION DIAGNOSIS:   Malnutrition (severe) related to chronic illness (Alzheimer's DZ) as evidenced by severe depletion of muscle mass, severe depletion of body fat.  GOAL:   Patient will meet greater than or equal to 90% of their needs  MONITOR:   PO intake, Supplement acceptance, Labs, I & O's  REASON FOR ASSESSMENT:   Consult Assessment of nutrition requirement/status  ASSESSMENT:   83 yo male with PMH of HTN, herpes zoster, staph skin infection, Alzheimer's disease, a fib, who was admitted on 9/20 from [facility] with MRSA bacteremia, endocarditis, and HCAP.  Patient unable to provide any nutrition hx. He said he didn't eat much for breakfast.   Nutrition-Focused physical exam completed. Findings are moderate and severe fat depletion, mild/moderate and severe muscle depletion, and no edema.   Per review of usual weights, weight seems fairly stable for the past 2 months.   Labs reviewed. Prealbumin 12.1, decreased likely related to acute on chronic inflammatory response. Medications reviewed.  Diet Order:  Diet regular Room service appropriate? Yes; Fluid consistency: Thin  Skin:  Reviewed, no issues  Last BM:  9/20  Height:   Ht Readings from Last 1 Encounters:  12/09/2016  (1.753 m)    Weight:   Wt Readings from Last 1 Encounters:  12/23/16 132 lb (59.9 kg)    Ideal Body Weight:  72.7 kg  BMI:  Body mass index is 19.49 kg/m.  Estimated Nutritional Needs:   Kcal:  1700-1900  Protein:  85-95 gm  Fluid:  1.8 L  EDUCATION NEEDS:   No education needs identified at this time  Joaquin Courts, RD, LDN, CNSC Pager [phone] After Hours Pager [phone]

## 2016-12-23 NOTE — Consult Note (Signed)
Consultation Note Date: 12/23/2016   Patient Name: Jon Cross  DOB: 23-May-1932  MRN: [id number]  Age / Sex: 83 y.o., male  PCP: Joette Catching, MD Referring Physician: Coralie Keens  Reason for Consultation: Establishing goals of care  HPI/Patient Profile: 83 y.o. male  admitted on 2017/01/15   Clinical Assessment and Goals of Care:  Patient is a 83 year old woman with a past medical history significant for Alzheimer's, stage III chronic kidney disease, atrial fibrillation, history of hospitalization in July 2018 for MRSA endocarditis of mitral valve, patient required 6 weeks of antibiotics that ended towards the end of August 2018. Patient was again hospitalized on September 16 with fever cough shortness of breath, positive blood cultures with MRSA. Patient had transthoracic echocardiogram showing mild tricuspid regurgitation/mitral regurgitation, no specific mention of vegetation. Patient has been initiated again on broad-spectrum IV antibiotics, infectious disease and cardiology are following.  Of note, the patient was seen by palliative service and a previous hospitalization at [facility] in July 2018 when he was hospitalized for possible endocarditis, bacteremia. At that time, goals of care discussions were undertaken with [name] healthcare power of attorney agent and lives in [place], at that time son had elected for full code, life-prolonging life maintaining goals of care.  A palliative consultation is again requested in light of the patient's current hospitalization, current episode of bacteremia.  The patient is resting in a chair. He knows his name otherwise does not verbalize much. He is not agitated. He does not know why he is in the hospital.  Call placed and discussed with [name] who is noted to be the healthcare power of attorney agent, only child, he lives in  [place]. We discussed about the patient's disease trajectory, current hospitalization. Goals wishes and values discussed. The patient reportedly completed his advanced directive sometime back, in those advanced directives, he had wished for no heroic measures at end-of-life. Hence, we discussed about DO NOT RESUSCITATE/DO NOT INTUBATE in detail. The patient would not want to undergo CPR, would not want to undergo mechanical measures.  The son wishes to continue current scope of hospitalization. He is hopeful that with current round of antibiotics, the patient will have some steps towards stabilization/recovery. That being said, the son states that he is well aware that we are doing all we can with antibiotics and recognizes from his previous conversations with other hospital staff that the patient is not a candidate for surgery and that he continues to have a high risk of not doing well despite best medical measures.  Thank you for the consult. We will continue to follow hospital course, disease trajectory, discuss regularly with son healthcare power of attorney agent for additional goals of care discussions.  HCPOA  [name], who lives in [place].  He has a copy of HCPOA and Advanced directives, son has been provided with our fax number [phone] so he may fax [place] these documents.   Pertinent social history: Patient was living at home with  his niece Jon Cross up until these recent hospitalizations. She is his full-time caregiver but not Management consultant.  SUMMARY OF RECOMMENDATIONS    Discussed with son healthcare power of attorney agent: No CPR, no intubation/mechanical ventilation. Okay to use ACLS protocol medications, BiPAP for short period of time if it will help with comfort measures should the patient have an arrest.  Continue current scope of medical care, follow disease trajectory. Goals are not comfort only at this point. Thank you for the consult. See above.   Code  Status/Advance Care Planning:  Limited code    Symptom Management:    as above   Palliative Prophylaxis:   Delirium Protocol   Psycho-social/Spiritual:   Desire for further Chaplaincy support:yes  Additional Recommendations: Caregiving  Support/Resources  Prognosis:   guarded  Discharge Planning: To Be Determined      Primary Diagnoses: Present on Admission: . MRSA bacteremia . Essential hypertension, benign . Endocarditis of native valve . Dementia without behavioral disturbance   I have reviewed the medical record, interviewed the patient and family, and examined the patient. The following aspects are pertinent.  Past Medical History:  Diagnosis Date  . Alzheimer disease   . Atrial fibrillation (HCC)   . Essential hypertension, benign   . Herpes zoster   . Staphylococcal skin infection    Social History   Social History  . Marital status: Single    Spouse name: N/A  . Number of children: N/A  . Years of education: N/A   Social History Main Topics  . Smoking status: Current Every Day Smoker    Packs/day: 1.00    Years: 31.00    Types: Cigarettes  . Smokeless tobacco: Never Used     Comment: quit smoking for 20 years  . Alcohol use Yes  . Drug use: No  . Sexual activity: Not on file   Other Topics Concern  . Not on file   Social History Narrative  . No narrative on file   Family History  Problem Relation Age of Onset  . Cancer Unknown    Scheduled Meds: . aspirin EC  81 mg Oral Daily  . donepezil  10 mg Oral QHS  . heparin  5,000 Units Subcutaneous Q8H  . levalbuterol  1.25 mg Nebulization TID  . memantine  10 mg Oral Daily  . metoprolol tartrate  50 mg Oral QID  . pantoprazole (PROTONIX) IV  40 mg Intravenous Q24H   Continuous Infusions: . sodium chloride 75 mL/hr at 12/23/16 0831  . vancomycin     PRN Meds:.acetaminophen, ondansetron **OR** ondansetron (ZOFRAN) IV, traMADol Medications Prior to Admission:  Prior to Admission  medications   Medication Sig Start Date End Date Taking? Authorizing Provider  aspirin EC 81 MG tablet Take 81 mg by mouth daily.   Yes [provider]  donepezil (ARICEPT) 5 MG tablet Take 5 mg by mouth daily. 10/25/16 10/25/17 Yes [provider]  ferrous sulfate 325 (65 FE) MG tablet Take 325 mg by mouth daily.   Yes [provider]  meloxicam (MOBIC) 7.5 MG tablet Take 7.5 mg by mouth daily. 12/12/16  Yes [provider]  memantine (NAMENDA) 10 MG tablet Take 1 tablet (10 mg total) by mouth daily. 11/01/16  Yes Tat, Onalee Hua, MD  pantoprazole (PROTONIX) 40 MG tablet Take 40 mg by mouth daily. 11/30/16  Yes [provider]  vitamin B-12 500 MCG tablet Take 1 tablet (500 mcg total) by mouth daily. 10/26/16  Yes TatOnalee Hua,  MD  trimethoprim-polymyxin b (POLYTRIM) ophthalmic solution Place 1 drop into the right eye every 4 (four) hours. X 3 days Patient not taking: Reported on 12/31/2016 10/25/16   Catarina Hartshorn, MD   Allergies  Allergen Reactions  . Dristan Cold [Chlorphen-Pe-Acetaminophen] Nausea Only  . Sulfonamide Derivatives Nausea Only   Review of Systems +weak Confused at baseline  Physical Exam Fatigued appearing gentleman resting in chair Knows his name, does not otherwise respond much Systolic murmur Coarse breath sounds No edema Abdomen soft  Vital Signs: BP (!) 141/82   Pulse 87   Temp (!) 97.4 F (36.3 C) (Axillary) Comment (Src): patient recently drank ice water   Resp (!) 21   Ht  (1.753 m)   Wt 59.9 kg (132 lb)   SpO2 93%   BMI 19.49 kg/m  Pain Assessment: No/denies pain   Pain Score: 0-No pain   SpO2: SpO2: 93 % O2 Device:SpO2: 93 % O2 Flow Rate: .O2 Flow Rate (L/min): 5 L/min  IO: Intake/output summary:  Intake/Output Summary (Last 24 hours) at 12/23/16 1049 Last data filed at 12/23/16 0600  Gross per 24 hour  Intake           1377.5 ml  Output                0 ml  Net           1377.5 ml    LBM: Last BM  Date: 12/17/2016 Baseline Weight: Weight: 61.6 kg (135 lb 11.2 oz) Most recent weight: Weight: 59.9 kg (132 lb)     Palliative Assessment/Data:   Flowsheet Rows     Most Recent Value  Intake Tab  Referral Department  Hospitalist  Unit at Time of Referral  Med/Surg Unit  Palliative Care Primary Diagnosis  Sepsis/Infectious Disease  Palliative Care Type  Return patient Palliative Care  Reason for referral  Clarify Goals of Care  Date first seen by Palliative Care  12/23/16  Clinical Assessment  Palliative Performance Scale Score  20%  Pain Max last 24 hours  4  Pain Min Last 24 hours  3  Dyspnea Max Last 24 Hours  4  Dyspnea Min Last 24 hours  3  Nausea Max Last 24 Hours  1  Nausea Min Last 24 Hours  1  Anxiety Max Last 24 Hours  3  Anxiety Min Last 24 Hours  2  Psychosocial & Spiritual Assessment  Palliative Care Outcomes  Patient/Family meeting held?  Yes  Who was at the meeting?  son HCPOA agent Mr Beaumier, who lives in [place].   Palliative Care Outcomes  Clarified goals of care      Time In:  9 Time Out:10.10   Time Total:  70 min  Greater than 50%  of this time was spent counseling and coordinating care related to the above assessment and plan.  Signed by: Rosalin Hawking, MD  [phone]  Please contact Palliative Medicine Team phone at [phone] for questions and concerns.  For individual provider: See Loretha Stapler

## 2016-12-23 NOTE — Progress Notes (Signed)
Pharmacy Antibiotic Note  Jon Cross is a 83 y.o. male admitted on 12/15/2016 with MRSA endocarditis and admitted from [facility] where he was receiving IV Vancomycin.  Pharmacy has been consulted for continuation of his IV Vancomycin management. He is noted with a recent history of MRSA endocarditis and completed therapy on 11/29/16. ID following- plans will likely be a prolonged course of antibiotics.   -Vancomycin dose missed 9/20 (reason unclear) -SCr= 1.78 (baseline is 1.1-1.3), CrCl ~ 30   Plan: - Vancomycin 1gm IV every 24 hours. -Will follow renal function, cultures and clinical progress   Height:  (175.3 cm) Weight: 132 lb (59.9 kg) IBW/kg (Calculated) : 70.7  Temp (24hrs), Avg:97.6 F (36.4 C), Min:97.4 F (36.3 C), Max:97.7 F (36.5 C)   Recent Labs Lab 12/30/2016 1426 12/23/16 0440  WBC 10.2 11.2*  CREATININE 1.90* 1.78*    Estimated Creatinine Clearance: 27.6 mL/min (A) (by C-G formula based on SCr of 1.78 mg/dL (H)).    Allergies  Allergen Reactions  . Dristan Cold [Chlorphen-Pe-Acetaminophen] Nausea Only  . Sulfonamide Derivatives Nausea Only    Antimicrobials this admission: Vancomycin 9/17 (started at [place])>>    Dose adjustments this admission:   Microbiology results: 9/20 blood x2 9/16 blood- MRSA   [name], Pharm D 12/23/2016 11:00 AM

## 2016-12-23 NOTE — Evaluation (Signed)
Occupational Therapy Evaluation Patient Details Name: Jon Cross MRN: [id number] DOB: 1932-06-19 Today's Date: 12/23/2016    History of Present Illness Patient is a 83 y/o male transferred from [place] presents with recurring MRSA bacteremia endocarditis. CXR- small bilateral pleural effusions and cardiomegaly possible overlying infectious process. PMH includes Dementia, Afib, HTN, MRSA bacteremia and endocarditis    Clinical Impression   Pt with decline in function and safety with ADLs and ADL mobility with decreased strength, balance and endurance. Pt with hx of cognitive impairments and uncertain of PLOF. Pt would benefit from acute OT services to address impairments to maximize level of function and safety    Follow Up Recommendations  SNF;Supervision/Assistance - 24 hour    Equipment Recommendations  None recommended by OT;Other (comment) (TBD at next venue of care)    Recommendations for Other Services       Precautions / Restrictions Precautions Precautions: Fall Precaution Comments: watch HR Restrictions Weight Bearing Restrictions: No      Mobility Bed Mobility Overal bed mobility: Needs Assistance Bed Mobility: Rolling;Sidelying to Sit Rolling: Min guard Sidelying to sit: Min guard;HOB elevated       General bed mobility comments: pt up in recliner upon arrival  Transfers Overall transfer level: Needs assistance Equipment used: Rolling walker (2 wheeled) Transfers: Sit to/from Stand Sit to Stand: Min assist         General transfer comment: Assist to power to standing from EOB. Bil knee instability. manual cues to place hands on RW for support. Pt stood at [facility] and was able to assist with LB bathing and peri hygiene    Balance Overall balance assessment: Needs assistance Sitting-balance support: Feet supported;Bilateral upper extremity supported Sitting balance-Leahy Scale: Fair Sitting balance - Comments: ABle to sit with BUE support on bed.     Standing balance support: During functional activity;Bilateral upper extremity supported Standing balance-Leahy Scale: Poor Standing balance comment: Pt stood at RW and was able to assist with LB bathing and peri hygiene                           ADL either performed or assessed with clinical judgement   ADL Overall ADL's : Needs assistance/impaired     Grooming: Wash/dry hands;Wash/dry face;Set up;Supervision/safety;Sitting Grooming Details (indicate cue type and reason): cues to initiate Upper Body Bathing: Set up;Supervision/ safety Upper Body Bathing Details (indicate cue type and reason): cues to initiate Lower Body Bathing: Moderate assistance;Sit to/from stand;Cueing for safety;Cueing for sequencing Lower Body Bathing Details (indicate cue type and reason): cues to initiate Upper Body Dressing : Set up;Supervision/safety;Sitting Upper Body Dressing Details (indicate cue type and reason): cues to initiate Lower Body Dressing: Sitting/lateral leans;Moderate assistance Lower Body Dressing Details (indicate cue type and reason): cues to initiate, Poor standing balance Toilet Transfer: Minimal assistance;BSC;RW;Cueing for safety;Cueing for sequencing;Stand-pivot Toilet Transfer Details (indicate cue type and reason): cues for correct hand placement Toileting- Clothing Manipulation and Hygiene: Sit to/from stand;Moderate assistance       Functional mobility during ADLs: Minimal assistance;Rolling walker;Cueing for sequencing;Cueing for safety General ADL Comments: Pt stood at [facility] and was able to assist with LB bathing and peri hygiene     Vision Baseline Vision/History: Wears glasses Wears Glasses: Reading only Patient Visual Report: No change from baseline                  Pertinent Vitals/Pain Pain Assessment: 0-10 Faces Pain Scale: Hurts even more Pain Location: when  trying to urinate Pain Descriptors / Indicators: Moaning Pain Intervention(s): Monitored  during session     Hand Dominance Right   Extremity/Trunk Assessment Upper Extremity Assessment Upper Extremity Assessment: Generalized weakness   Lower Extremity Assessment Lower Extremity Assessment: Defer to PT evaluation   Cervical / Trunk Assessment Cervical / Trunk Assessment: Kyphotic   Communication Communication Communication: HOH   Cognition Arousal/Alertness: Awake/alert Behavior During Therapy: Anxious Overall Cognitive Status: No family/caregiver present to determine baseline cognitive functioning                                 General Comments: Pt difficult to understand at times. Repeatedly saying "I'm weak" Hx of dementia- not sure how advanced as per notes from 1 month ago pt pretty active.   General Comments  Py pleasant and cooperative, able to follow simple commands to assist with ADLs               Home Living Family/patient expects to be discharged to:: Private residence Living Arrangements: Spouse/significant other Available Help at Discharge: Family Type of Home: House Home Access: Level entry     Home Layout: One level     Bathroom Shower/Tub: Producer, television/film/video: Standard     Home Equipment: Cane - single point;Walker - 2 wheels          Prior Functioning/Environment Level of Independence: Needs assistance;Independent with assistive device(s)  Gait / Transfers Assistance Needed: limited community amb with SPC, reportedly very active prior to first diagnosis with bacteremia ~ 1 month ago ADL's / Homemaking Assistance Needed: was independent in ADL before last hsopiatl admission; niece and her husband were helping with groceries and house work. Uncertain how much, if any assist with ADLs PTA this admission   Comments: Not able to understand pt at times. Above info is taken from admission 1 month ago.        OT Problem List: Decreased strength;Impaired balance (sitting and/or standing);Decreased  cognition;Decreased knowledge of precautions;Decreased safety awareness;Decreased activity tolerance;Decreased coordination;Decreased knowledge of use of DME or AE      OT Treatment/Interventions: Self-care/ADL training;DME and/or AE instruction;Therapeutic activities;Balance training;Therapeutic exercise;Patient/family education    OT Goals(Current goals can be found in the care plan section) Acute Rehab OT Goals Patient Stated Goal: none stated OT Goal Formulation: With patient Time For Goal Achievement: 12/30/16 Potential to Achieve Goals: Good ADL Goals Pt Will Perform Grooming: with min assist;with min guard assist;standing Pt Will Perform Upper Body Bathing: with set-up;sitting Pt Will Perform Lower Body Bathing: with min assist;with min guard assist;sit to/from stand Pt Will Perform Upper Body Dressing: with set-up;sitting Pt Will Transfer to Toilet: with min guard assist;bedside commode;ambulating;regular height toilet;grab bars Pt Will Perform Toileting - Clothing Manipulation and hygiene: with min assist;with min guard assist;sit to/from stand  OT Frequency: Min 2X/week   Barriers to D/C: Decreased caregiver support                        AM-PAC PT "6 Clicks" Daily Activity     Outcome Measure Help from another person eating meals?: None Help from another person taking care of personal grooming?: A Little Help from another person toileting, which includes using toliet, bedpan, or urinal?: A Lot Help from another person bathing (including washing, rinsing, drying)?: A Lot Help from another person to put on and taking off regular upper body clothing?: A Little Help  from another person to put on and taking off regular lower body clothing?: A Lot 6 Click Score: 16   End of Session Equipment Utilized During Treatment: Gait belt;Rolling walker;Other (comment) (BSC)  Activity Tolerance: Patient limited by fatigue Patient left: in chair;with call bell/phone within  reach;with chair alarm set  OT Visit Diagnosis: Unsteadiness on feet (R26.81);Muscle weakness (generalized) (M62.81);Cognitive communication deficit (R41.841);Other symptoms and signs involving cognitive function Symptoms and signs involving cognitive functions: Other cerebrovascular disease                Time: 1022-1049 OT Time Calculation (min): 27 min Charges:  OT General Charges $OT Visit: 1 Visit OT Evaluation $OT Eval Moderate Complexity: 1 Mod OT Treatments $Therapeutic Activity: 8-22 mins G-Codes: OT G-codes **NOT FOR INPATIENT CLASS** Functional Assessment Tool Used: AM-PAC 6 Clicks Daily Activity     Galen Manila 12/23/2016, 11:35 AM

## 2016-12-23 NOTE — Progress Notes (Signed)
PROGRESS NOTE    Jon Cross  BJY:[id number] DOB: [birth year] DOA: 12/17/2016 PCP: Joette Catching, MD    Brief Narrative:  83 year old male who presented with bacteremia and healthcare associated pneumonia. Patient was transferred from [facility]. He is known to have dementia, atrial fibrillation, and hypertension. Admitted 12/19/2016 with a working diagnosis of healthcare associated pneumonia, blood cultures came positive for MRSA. Patient had a recent episode of endocarditis treated with IV vancomycin until August 28. Due to persistent bacteremia he was transferred to [facility] for further evaluation to rule out recurrent endocarditis. Physical examination blood pressure 145/87, heart rate 90, respiratory 20, temperature 98.2, oxygen saturation 96%. Dry mucous membranes, coarse breath sounds bilaterally, positive rales, no wheezing, heart S1-S2 present irregularly irregular, systolic murmur at the parasternal region, abdomen soft nontender, no lower extremity edema.  Patient admitted to the hospital with working diagnosis of recurrent MRSA bacteremia, likely endocarditis.  Assessment & Plan:   Active Problems:   Essential hypertension, benign   Goals of care, counseling/discussion   MRSA bacteremia   Dementia without behavioral disturbance   Endocarditis of native valve   HCAP (healthcare-associated pneumonia)   Physical deconditioning   GERD (gastroesophageal reflux disease)   1. Endocarditis with mrsa bacteremia. Will continue antibiotic therapy with IV vancomycin, no clinical signs of hemodynamic decompensation or septic embolization. Follow up blood cultures, continue to be no growth, will follow with ID recommendations.   2. Health care associated pneumonia. Will continue antibiotic therapy for endocarditis, clinically with no dyspnea, will continue oxymetry monitoring and supplemental 02 per [place]. Continue xopinex.   3. HTN. Blood pressure well controlled,  continue close monitoring, will decrease rate of IV fluids to avoid volume overload.   4. Atrial fibrillation. Continue rate control, clinically stable. Continue AV blockade with metoprolol  5. Dementia. Patient with episodes of confusion and agitation, will continue close monitoring, avoid sedatives if possible. Continue donepezil and memantine.    DVT prophylaxis: enoxaparin  Code Status: Partial  Family Communication:  Disposition Plan:    Consultants:     Procedures:     Antimicrobials:       Subjective: Patient with no apparent dyspnea or chest pain, our of bed into the chair, no nausea or vomiting. No fever or chills.   Objective: Vitals:   12/26/2016 2100 12/23/16 0537 12/23/16 0737 12/23/16 1007  BP: 135/79 (!) 153/96  (!) 141/82  Pulse: (!) 108 (!) 124  87  Resp: (!) 21 (!) 21    Temp:  (!) 97.4 F (36.3 C)    TempSrc:  Axillary    SpO2: 99% 95% 93%   Weight:  59.9 kg (132 lb)    Height:        Intake/Output Summary (Last 24 hours) at 12/23/16 1057 Last data filed at 12/23/16 0600  Gross per 24 hour  Intake           1377.5 ml  Output                0 ml  Net           1377.5 ml   Filed Weights   12/31/2016 1423 12/16/2016 1439 12/23/16 0537  Weight: 61.6 kg (135 lb 11.2 oz) 61.6 kg (135 lb 11.2 oz) 59.9 kg (132 lb)    Examination:  General: deconditioned Neurology: Awake, but confused, non focal  E ENT: mild pallor, no icterus, oral mucosa moist Cardiovascular: No JVD. S1-S2 present, rhythmic, no gallops, rubs, positive murmurs  at the left sterna border systolic III/VI. Trace extremity edema. Pulmonary: decreased breath sounds bilaterally at bases, adequate air movement, no wheezing, rhonchi or rales. Gastrointestinal. Abdomen flat, no organomegaly, non tender, no rebound or guarding Skin. No rashes Musculoskeletal: no joint deformities     Data Reviewed: I have personally reviewed following labs and imaging studies  CBC:  Recent Labs Lab  12/17/2016 1426 12/23/16 0440  WBC 10.2 11.2*  NEUTROABS 8.3*  --   HGB 11.0* 10.9*  HCT 35.0* 35.0*  MCV 100.0 100.9*  PLT 122* 130*   Basic Metabolic Panel:  Recent Labs Lab 12/11/2016 1426 12/23/16 0440  NA 146* 145  K 3.7 3.6  CL 110 110  CO2 26 26  GLUCOSE 106* 109*  BUN 41* 39*  CREATININE 1.90* 1.78*  CALCIUM 8.8* 8.7*  MG 2.1  --   PHOS 3.2  --    GFR: Estimated Creatinine Clearance: 27.6 mL/min (A) (by C-G formula based on SCr of 1.78 mg/dL (H)). Liver Function Tests: No results for input(s): AST, ALT, ALKPHOS, BILITOT, PROT, ALBUMIN in the last 168 hours. No results for input(s): LIPASE, AMYLASE in the last 168 hours. No results for input(s): AMMONIA in the last 168 hours. Coagulation Profile: No results for input(s): INR, PROTIME in the last 168 hours. Cardiac Enzymes: No results for input(s): CKTOTAL, CKMB, CKMBINDEX, TROPONINI in the last 168 hours. BNP (last 3 results) No results for input(s): PROBNP in the last 8760 hours. HbA1C: No results for input(s): HGBA1C in the last 72 hours. CBG: No results for input(s): GLUCAP in the last 168 hours. Lipid Profile: No results for input(s): CHOL, HDL, LDLCALC, TRIG, CHOLHDL, LDLDIRECT in the last 72 hours. Thyroid Function Tests: No results for input(s): TSH, T4TOTAL, FREET4, T3FREE, THYROIDAB in the last 72 hours. Anemia Panel: No results for input(s): VITAMINB12, FOLATE, FERRITIN, TIBC, IRON, RETICCTPCT in the last 72 hours.    Radiology Studies: I have reviewed all of the imaging during this hospital visit personally     Scheduled Meds: . aspirin EC  81 mg Oral Daily  . donepezil  10 mg Oral QHS  . heparin  5,000 Units Subcutaneous Q8H  . levalbuterol  1.25 mg Nebulization TID  . memantine  10 mg Oral Daily  . metoprolol tartrate  50 mg Oral QID  . pantoprazole (PROTONIX) IV  40 mg Intravenous Q24H   Continuous Infusions: . sodium chloride 75 mL/hr at 12/23/16 0831  . vancomycin       LOS: 1  day        Coralie Keens, MD [facility] Pager [phone]

## 2016-12-24 ENCOUNTER — Inpatient Hospital Stay (HOSPITAL_COMMUNITY): Payer: Medicare HMO

## 2016-12-24 LAB — BLOOD GAS, ARTERIAL
Acid-base deficit: 1.4 mmol/L (ref 0.0–2.0)
Bicarbonate: 25.3 mmol/L (ref 20.0–28.0)
DRAWN BY: 246101
O2 Content: 15 L/min
O2 Saturation: 97.2 %
PCO2 ART: 62.7 mmHg — AB (ref 32.0–48.0)
PH ART: 7.229 — AB (ref 7.350–7.450)
Patient temperature: 98.6
pO2, Arterial: 108 mmHg (ref 83.0–108.0)

## 2016-12-24 LAB — CBC WITH DIFFERENTIAL/PLATELET
BASOS ABS: 0 10*3/uL (ref 0.0–0.1)
Basophils Relative: 0 %
EOS PCT: 0 %
Eosinophils Absolute: 0 10*3/uL (ref 0.0–0.7)
HEMATOCRIT: 36.4 % — AB (ref 39.0–52.0)
Hemoglobin: 11.2 g/dL — ABNORMAL LOW (ref 13.0–17.0)
LYMPHS PCT: 16 %
Lymphs Abs: 2 10*3/uL (ref 0.7–4.0)
MCH: 31.5 pg (ref 26.0–34.0)
MCHC: 30.8 g/dL (ref 30.0–36.0)
MCV: 102.2 fL — AB (ref 78.0–100.0)
Monocytes Absolute: 1.1 10*3/uL — ABNORMAL HIGH (ref 0.1–1.0)
Monocytes Relative: 9 %
Neutro Abs: 8.9 10*3/uL — ABNORMAL HIGH (ref 1.7–7.7)
Neutrophils Relative %: 75 %
PLATELETS: 151 10*3/uL (ref 150–400)
RBC: 3.56 MIL/uL — ABNORMAL LOW (ref 4.22–5.81)
RDW: 14.1 % (ref 11.5–15.5)
WBC: 12 10*3/uL — AB (ref 4.0–10.5)

## 2016-12-24 LAB — BASIC METABOLIC PANEL
ANION GAP: 8 (ref 5–15)
BUN: 49 mg/dL — AB (ref 6–20)
CO2: 24 mmol/L (ref 22–32)
Calcium: 8.8 mg/dL — ABNORMAL LOW (ref 8.9–10.3)
Chloride: 108 mmol/L (ref 101–111)
Creatinine, Ser: 2.02 mg/dL — ABNORMAL HIGH (ref 0.61–1.24)
GFR calc Af Amer: 34 mL/min — ABNORMAL LOW (ref >60)
GFR calc non Af Amer: 29 mL/min — ABNORMAL LOW (ref >60)
GLUCOSE: 130 mg/dL — AB (ref 65–99)
Potassium: 4.3 mmol/L (ref 3.5–5.1)
Sodium: 140 mmol/L (ref 135–145)

## 2016-12-24 MED ORDER — METOPROLOL TARTRATE 5 MG/5ML IV SOLN
5.0000 mg | Freq: Four times a day (QID) | INTRAVENOUS | Status: DC | PRN
  Administered 2016-12-24 – 2016-12-26 (×5): 5 mg via INTRAVENOUS
  Filled 2016-12-24 (×5): qty 5

## 2016-12-24 NOTE — Progress Notes (Signed)
Late entry-  Pt was placed on bipap d/t ABG results.  BBSH w/ coarse crackles t/o.  RN aware.  Pt appears to tolerate well.

## 2016-12-24 NOTE — Progress Notes (Signed)
Pt appears to be tolerating bipap well so far this shift.  RN states pt needs PO meds, however we discussed that pt mental status may not be appropriate currently for PO meds, or to come off bipap yet and to discuss w/ MD if IV meds, or ABG needed.

## 2016-12-24 NOTE — Progress Notes (Addendum)
Progress Note  Patient Name: Jon Cross Date of Encounter: 12/24/2016  Primary Cardiologist: Dr. Prentice Docker  Subjective   Patient currently on BiPAP.  Inpatient Medications    Scheduled Meds: . aspirin EC  81 mg Oral Daily  . donepezil  10 mg Oral QHS  . feeding supplement (ENSURE ENLIVE)  237 mL Oral TID BM  . heparin  5,000 Units Subcutaneous Q8H  . levalbuterol  1.25 mg Nebulization TID  . memantine  10 mg Oral Daily  . metoprolol tartrate  50 mg Oral QID  . pantoprazole (PROTONIX) IV  40 mg Intravenous Q24H   Continuous Infusions: . sodium chloride 50 mL/hr at 12/23/16 1121  . vancomycin Stopped (12/23/16 1357)   PRN Meds: acetaminophen, haloperidol **OR** haloperidol lactate, ondansetron **OR** ondansetron (ZOFRAN) IV, traMADol   Vital Signs    Vitals:   12/23/16 2114 12/24/16 0511 12/24/16 0814 12/24/16 0854  BP:  124/83  (!) 143/75  Pulse:  83  90  Resp:  15  19  Temp:  97.8 F (36.6 C)    TempSrc:  Oral    SpO2: 96% 94% 98% 98%  Weight:  131 lb 8 oz (59.6 kg)    Height:        Intake/Output Summary (Last 24 hours) at 12/24/16 0938 Last data filed at 12/24/16 0500  Gross per 24 hour  Intake              700 ml  Output              250 ml  Net              450 ml   Filed Weights   12/17/2016 1439 12/23/16 0537 12/24/16 0511  Weight: 135 lb 11.2 oz (61.6 kg) 132 lb (59.9 kg) 131 lb 8 oz (59.6 kg)    Telemetry    Sinus rhythm with frequent PACs and occasional PVCs. Personally reviewed.  Physical Exam   GEN: Elderly male, recently lethargic, on BiPAP.   Neck: No JVD. Cardiac: RRR with ectopy, soft systolic murmur.  Respiratory: No wheezing. GI: Soft, nontender, bowel sounds present. MS: No edema; No deformity.  Labs    Chemistry Recent Labs Lab 12/06/2016 1426 12/23/16 0440 12/24/16 0805  NA 146* 145 140  K 3.7 3.6 4.3  CL 110 110 108  CO2 GLUCOSE 106* 109* 130*  BUN 41* 39* 49*  CREATININE 1.90* 1.78* 2.02*    CALCIUM 8.8* 8.7* 8.8*  GFRNONAA 31* 34* 29*  GFRAA 36* 39* 34*  ANIONGAP Hematology Recent Labs Lab 12/30/2016 1426 12/23/16 0440 12/24/16 0805  WBC 10.2 11.2* 12.0*  RBC 3.50* 3.47* 3.56*  HGB 11.0* 10.9* 11.2*  HCT 35.0* 35.0* 36.4*  MCV 100.0 100.9* 102.2*  MCH 31.4 31.4 31.5  MCHC 31.4 31.1 30.8  RDW 14.1 14.2 14.1  PLT 122* 130* 151    Radiology    Dg Chest 2 View  Result Date: 12/07/2016 CLINICAL DATA:  Lethargy, atrial fibrillation, hypertension, pneumonia EXAM: CHEST  2 VIEW COMPARISON:  12/18/2016 FINDINGS: Cardiomegaly evident with worsening diffuse bilateral airspace process versus edema. Superimposed CHF is favored. Developing effusions noted layering posteriorly. Dense left lower lobe retrocardiac consolidation persist. No pneumothorax. Aorta is atherosclerotic and tortuous. Trachea is midline. Bones are osteopenic. Degenerative changes of the spine. Increased thoracic kyphosis noted. Mild lower thoracic compression fracture, age indeterminate. IMPRESSION: Cardiomegaly with worsening diffuse airspace process versus edema  and pleural effusions. CHF is favored. Dense left lower lobe collapse/consolidation. Pneumonia not excluded. Electronically Signed   By: Judie Petit.  Shick M.D.   On: 12/29/2016 18:27   Dg Chest Port 1 View  Result Date: 12/24/2016 CLINICAL DATA:  Hypoxia EXAM: PORTABLE CHEST 1 VIEW COMPARISON:  12/25/2016 FINDINGS: Cardiac shadow remains enlarged. Diffuse airspace disease is again identified throughout both lungs. No pneumothorax is seen. No bony abnormality is noted. IMPRESSION: Stable bilateral airspace disease. Electronically Signed   By: Alcide Clever M.D.   On: 12/24/2016 09:14    Cardiac Studies   TEE 10/20/2016: 1. Normal LV systolic function, LVEF 50-55%. 2. 1.5 x 1.9 cm vegetation seen on posterior mitral leaflet. 3. Trivial mitral regurgitation. 4. Moderate aortic regurgitation.  Patient Profile     83 y.o. male with a history of  MRSA bacteremia with mitral valve endocarditis, paroxysmal atrial tachycardia, progressive Alzheimer's dementia, and recent GI bleed in August with findings of chronic gastritis and severe duodenal inflammation by endoscopy. He is being followed by the ID service on vancomycin. Has also had consultation by the Palliative Care team. Currently with limited CODE STATUS.  Assessment & Plan    1. MRSA bacteremia with history of mitral valve endocarditis. Currently on vancomycin and followed by the ID service. Most recent repeat blood cultures show no growth and he is afebrile. He is a poor surgical candidate and no additional cardiac imaging studies are planned at this point.  2. History of paroxysmal atrial tachycardia. Rhythm looks to be sinus with frequent PACs at this time, not atrial fibrillation. He is on Lopressor.  3. Progressive dementia. He is on Aricept and Namenda.  4. Hypercarbic respiratory failure, currently on BiPAP for management of primary team.  Continue treatment per internal medicine and ID service recommendations. No additional cardiac studies planned at this particular time. We will follow at a distance.  Signed, Nona Dell, MD  12/24/2016, 9:38 AM

## 2016-12-24 NOTE — Progress Notes (Addendum)
Patient ID: KENTRAVIOUS LIPFORD, male   DOB: 1932/12/14, 83 y.o.   MRN: [id number]  PROGRESS NOTE    SAMIK BALKCOM  XLK:[id number] DOB: 03/30/1933 DOA: 12/21/2016  PCP: Joette Catching, MD   Brief Narrative:  83 year old male with dementia, atrial fibrillation, hypertension who was transferred from [facility] for further treatment of HCAP, blood cx positive for MRSA. Pt had recent episode of endocarditis, treated with IV vanco until 11/29/16. Due to persistent bacteremia he was transferred to [facility] to rule out recurrent endocarditis.   Assessment & Plan:   Active Problems: Endocarditis with mrsa bacteremia - Continue IV vanco - Follow up ID recommendations  Acute respiratory failure with hypoxia - We ordered ABG and CXR this am, he is on 6 L [place] to keep O2 sats above 90% - Will likely need BiPAP  Health care associated pneumonia  - Continue current abx therapy  Essential hypertension - Continue metoprolol   PAT/PAC / occasional tachycardia - Rhythm is sinus tachycardia / paroxysmal Atrial tachycardia, does not appear to be atrial fibrillation - No anticoagulation necessary - Continue metoprolol 50 mg QID - Continue aspirin   Dementia without behavior disturbance - As needed ativan for agitation or restlessness  - Continue namenda and aricept    DVT prophylaxis: Heparin subQ Code Status: partial code (No CPR or intubation but okay for BiPAP and meds)  Family Communication: no family at the bedside  Disposition Plan: remains inpatient, not yet stable for discharge    Consultants:   ID  Cardio  Procedures:   None  Antimicrobials:   Vanco -->   Subjective: No overnight events.  Objective: Vitals:   12/24/16 0511 12/24/16 0814 12/24/16 0854 12/24/16 1135  BP: 124/83  (!) 143/75 130/74  Pulse: 83  90 77  Resp: Temp: 97.8 F (36.6 C)     TempSrc: Oral     SpO2: 94% 98% 98% 99%  Weight: 59.6 kg (131 lb 8 oz)     Height:        Intake/Output  Summary (Last 24 hours) at 12/24/16 1142 Last data filed at 12/24/16 0500  Gross per 24 hour  Intake              700 ml  Output              250 ml  Net              450 ml   Filed Weights   12/18/2016 1439 12/23/16 0537 12/24/16 0511  Weight: 61.6 kg (135 lb 11.2 oz) 59.9 kg (132 lb) 59.6 kg (131 lb 8 oz)    Examination:  General exam: Appears restless, no distress  Respiratory system: no wheezing, diminished  Cardiovascular system: S1 & S2 heard, Rate controlled  Gastrointestinal system: Abdomen is nondistended, soft and nontender. No organomegaly or masses felt. Normal bowel sounds heard. Central nervous system: Alert and oriented. No focal neurological deficits. Extremities: Symmetric 5 x 5 power. Skin: No rashes, lesions or ulcers Psychiatry: Restless and agitated   Data Reviewed: I have personally reviewed following labs and imaging studies  CBC:  Recent Labs Lab 12/10/2016 1426 12/23/16 0440 12/24/16 0805  WBC 10.2 11.2* 12.0*  NEUTROABS 8.3*  --  8.9*  HGB 11.0* 10.9* 11.2*  HCT 35.0* 35.0* 36.4*  MCV 100.0 100.9* 102.2*  PLT 122* 130* 151   Basic Metabolic Panel:  Recent Labs Lab 12/11/2016 1426 12/23/16 0440 12/24/16 0805  NA  146* 145 140  K 3.7 3.6 4.3  CL 110 110 108  CO2 GLUCOSE 106* 109* 130*  BUN 41* 39* 49*  CREATININE 1.90* 1.78* 2.02*  CALCIUM 8.8* 8.7* 8.8*  MG 2.1  --   --   PHOS 3.2  --   --    GFR: Estimated Creatinine Clearance: 24.2 mL/min (A) (by C-G formula based on SCr of 2.02 mg/dL (H)). Liver Function Tests: No results for input(s): AST, ALT, ALKPHOS, BILITOT, PROT, ALBUMIN in the last 168 hours. No results for input(s): LIPASE, AMYLASE in the last 168 hours. No results for input(s): AMMONIA in the last 168 hours. Coagulation Profile: No results for input(s): INR, PROTIME in the last 168 hours. Cardiac Enzymes: No results for input(s): CKTOTAL, CKMB, CKMBINDEX, TROPONINI in the last 168 hours. BNP (last 3  results) No results for input(s): PROBNP in the last 8760 hours. HbA1C: No results for input(s): HGBA1C in the last 72 hours. CBG: No results for input(s): GLUCAP in the last 168 hours. Lipid Profile: No results for input(s): CHOL, HDL, LDLCALC, TRIG, CHOLHDL, LDLDIRECT in the last 72 hours. Thyroid Function Tests: No results for input(s): TSH, T4TOTAL, FREET4, T3FREE, THYROIDAB in the last 72 hours. Anemia Panel: No results for input(s): VITAMINB12, FOLATE, FERRITIN, TIBC, IRON, RETICCTPCT in the last 72 hours. Urine analysis:    Component Value Date/Time   COLORURINE YELLOW 01/17/2017 1500   APPEARANCEUR HAZY (A) 01/17/17 1500   LABSPEC 1.019 01/17/17 1500   PHURINE 5.0 01-17-2017 1500   GLUCOSEU NEGATIVE January 17, 2017 1500   HGBUR LARGE (A) 01/17/2017 1500   BILIRUBINUR NEGATIVE 17-Jan-2017 1500   KETONESUR NEGATIVE 01/17/2017 1500   PROTEINUR 100 (A) 17-Jan-2017 1500   NITRITE NEGATIVE 01-17-2017 1500   LEUKOCYTESUR TRACE (A) Jan 17, 2017 1500   Sepsis Labs: (procalcitonin:4,lacticidven:4)   Recent Results (from the past 240 hour(s))  Culture, blood (Routine X 2) w Reflex to ID Panel     Status: None (Preliminary result)   Collection Time: 01/17/2017  2:26 PM  Result Value Ref Range Status   Specimen Description BLOOD RIGHT ANTECUBITAL  Final   Special Requests   Final    BOTTLES DRAWN AEROBIC AND ANAEROBIC Blood Culture adequate volume   Culture NO GROWTH 2 DAYS  Final   Report Status PENDING  Incomplete  Culture, blood (Routine X 2) w Reflex to ID Panel     Status: None (Preliminary result)   Collection Time: Jan 17, 2017  3:00 PM  Result Value Ref Range Status   Specimen Description BLOOD RIGHT WRIST  Final   Special Requests   Final    BOTTLES DRAWN AEROBIC AND ANAEROBIC Blood Culture adequate volume   Culture NO GROWTH 2 DAYS  Final   Report Status PENDING  Incomplete      Radiology Studies: Dg Chest 2 View  Result Date: Jan 17, 2017 CLINICAL DATA:   Lethargy, atrial fibrillation, hypertension, pneumonia EXAM: CHEST  2 VIEW COMPARISON:  12/18/2016 FINDINGS: Cardiomegaly evident with worsening diffuse bilateral airspace process versus edema. Superimposed CHF is favored. Developing effusions noted layering posteriorly. Dense left lower lobe retrocardiac consolidation persist. No pneumothorax. Aorta is atherosclerotic and tortuous. Trachea is midline. Bones are osteopenic. Degenerative changes of the spine. Increased thoracic kyphosis noted. Mild lower thoracic compression fracture, age indeterminate. IMPRESSION: Cardiomegaly with worsening diffuse airspace process versus edema and pleural effusions. CHF is favored. Dense left lower lobe collapse/consolidation. Pneumonia not excluded. Electronically Signed   By: Judie Petit.  Shick M.D.   On: 01/17/17  18:27   Dg Chest Port 1 View  Result Date: 12/24/2016 CLINICAL DATA:  Hypoxia EXAM: PORTABLE CHEST 1 VIEW COMPARISON:  12/29/2016 FINDINGS: Cardiac shadow remains enlarged. Diffuse airspace disease is again identified throughout both lungs. No pneumothorax is seen. No bony abnormality is noted. IMPRESSION: Stable bilateral airspace disease. Electronically Signed   By: Alcide Clever M.D.   On: 12/24/2016 09:14     Scheduled Meds: . aspirin EC  81 mg Oral Daily  . donepezil  10 mg Oral QHS  . feeding supplement  237 mL Oral TID BM  . heparin  5,000 Units Subcutaneous Q8H  . levalbuterol  1.25 mg Nebulization TID  . memantine  10 mg Oral Daily  . metoprolol tartrate  50 mg Oral QID  . pantoprazole   40 mg Intravenous Q24H   Continuous Infusions: . sodium chloride 50 mL/hr at 12/23/16 1121  . vancomycin Stopped (12/23/16 1357)     LOS: 2 days    Time spent: 25 minutes  Greater than 50% of the time spent on counseling and coordinating the care.   Manson Passey, MD [facility] Pager [phone]  If 7PM-7AM, please contact night-coverage [email or web address] Password [facility] 12/24/2016, 11:42 AM

## 2016-12-24 NOTE — Progress Notes (Signed)
Pt is lethargic this morning, O2 sat 89 on 6l of o2 nasal canula. VS: 144/85 -89-28. MD paged.  Colleen Can, RN

## 2016-12-24 NOTE — Progress Notes (Signed)
Per RT, Blood gas are critical, will need BIPAP. MD paged and ordered BIPAP. Will continue to monitor closely.  Colleen Can, RN.

## 2016-12-25 LAB — BASIC METABOLIC PANEL
Anion gap: 9 (ref 5–15)
BUN: 49 mg/dL — AB (ref 6–20)
CO2: 24 mmol/L (ref 22–32)
CREATININE: 1.98 mg/dL — AB (ref 0.61–1.24)
Calcium: 8.9 mg/dL (ref 8.9–10.3)
Chloride: 111 mmol/L (ref 101–111)
GFR calc Af Amer: 35 mL/min — ABNORMAL LOW (ref >60)
GFR calc non Af Amer: 30 mL/min — ABNORMAL LOW (ref >60)
Glucose, Bld: 113 mg/dL — ABNORMAL HIGH (ref 65–99)
Potassium: 3.9 mmol/L (ref 3.5–5.1)
SODIUM: 144 mmol/L (ref 135–145)

## 2016-12-25 LAB — CBC
HCT: 34.1 % — ABNORMAL LOW (ref 39.0–52.0)
Hemoglobin: 10.4 g/dL — ABNORMAL LOW (ref 13.0–17.0)
MCH: 30.5 pg (ref 26.0–34.0)
MCHC: 30.5 g/dL (ref 30.0–36.0)
MCV: 100 fL (ref 78.0–100.0)
Platelets: 181 10*3/uL (ref 150–400)
RBC: 3.41 MIL/uL — ABNORMAL LOW (ref 4.22–5.81)
RDW: 13.9 % (ref 11.5–15.5)
WBC: 12.5 10*3/uL — ABNORMAL HIGH (ref 4.0–10.5)

## 2016-12-25 LAB — MRSA PCR SCREENING: MRSA BY PCR: INVALID — AB

## 2016-12-25 NOTE — Progress Notes (Signed)
Patient remains on bipap at this time. Breath sounds scattered rhonchi with decreased airflow on the left side. Removed patient from bipap and nasal trachea suctioned for copious amount of thick brown/yellow secretions. Air movement did improve after suction.

## 2016-12-25 NOTE — Progress Notes (Signed)
Patient resting comfortable at this time. Placed patient on 10lpm high flow with Sp02=95%. Will continue to monitor patient.

## 2016-12-25 NOTE — Progress Notes (Signed)
Called to bedside d/t pt desat to 70%.   Pt already placed on bipap by RN, sat improving to 90%.  Increased fio2 to 100%, sat slowly increased to 99%.  BBSH w/ right clear diminished, left breath sounds very diminished t/o w/ little to no air movement noted.  RN has paged MD to notify and request ? cxray.  Sat now 99%,  VSS, no resp distress noted now on bipap.

## 2016-12-25 NOTE — Progress Notes (Addendum)
Patient ID: Jon Cross, male   DOB: 03/16/1933, 83 y.o.   MRN: [id number]  PROGRESS NOTE    Jon Cross  WUJ:[id number] DOB: 09-19-1932 DOA: 12/03/2016  PCP: Joette Catching, MD   Brief Narrative:  83 year old male with dementia, atrial fibrillation, hypertension who was transferred from [facility] for further treatment of HCAP, blood cx positive for MRSA. Pt had recent episode of endocarditis, treated with IV vanco until 11/29/16. Due to persistent bacteremia he was transferred to [facility] to rule out recurrent endocarditis.   Assessment & Plan:   Active Problems: Endocarditis with mrsa bacteremia / Leukocytosis  - Continue IV Vanco - Follow up ID recommendations   Acute respiratory failure with hypoxia - CXR on 9/22 showed stable bilateral airspace disease - May use intermittent BiPAP  Healthcare associated pneumonia  - Continue IV Vanco  Essential hypertension - Continue metoprolol   PAT/PAC / occasional tachycardia - Rhythm is sinus tachycardia / paroxysmal Atrial tachycardia, does not appear to be atrial fibrillation - Continue metoprolol - Continue Asprin for AC  Dementia without behavior disturbance - Continue namenda or aricept    DVT prophylaxis: Aspirn and SCD's Code Status: partial code (No CPR or intubation but okay for BiPAP and meds)  Family Communication: no family at the bedside  Disposition Plan: not yet stable for discharge    Consultants:   ID Cardiology  Palliative care   Procedures:   None   Antimicrobials:   Vanco -->   Subjective: No overnight events.  Objective: Vitals:   12/25/16 0520 12/25/16 0825 12/25/16 0837 12/25/16 0849  BP: (!) 152/90   (!) 159/111  Pulse: (!) 107  (!) 108 70  Resp:   (!) 23   Temp: 97.6 F (36.4 C)   97.8 F (36.6 C)  TempSrc: Axillary   Axillary  SpO2: 97% 96% 96%   Weight: 59.8 kg (131 lb 12.8 oz)     Height:        Intake/Output Summary (Last 24 hours) at 12/25/16 1024 Last data  filed at 12/25/16 0947  Gross per 24 hour  Intake              200 ml  Output              775 ml  Net             -575 ml   Filed Weights   12/23/16 0537 12/24/16 0511 12/25/16 0520  Weight: 59.9 kg (132 lb) 59.6 kg (131 lb 8 oz) 59.8 kg (131 lb 12.8 oz)    Physical Exam  Constitutional: Appears calm, no distress  CVS: rate controlled, S1/S2 +, SEM +3 appreciated  Pulmonary: diminished breath  Sounds, coarse breath sounds  Abdominal: Soft. BS +,  no distension, tenderness, rebound or guarding.  Musculoskeletal: No edema and no tenderness.  Lymphadenopathy: No lymphadenopathy noted, cervical, inguinal. Neuro: No focal deficit Skin: Skin is warm and dry.  Psychiatric: Normal mood and affect.     Data Reviewed: I have personally reviewed following labs and imaging studies  CBC:  Recent Labs Lab 01/01/2017 1426 12/23/16 0440 12/24/16 0805 12/25/16 0556  WBC 10.2 11.2* 12.0* 12.5*  NEUTROABS 8.3*  --  8.9*  --   HGB 11.0* 10.9* 11.2* 10.4*  HCT 35.0* 35.0* 36.4* 34.1*  MCV 100.0 100.9* 102.2* 100.0  PLT 122* 130* 151 181   Basic Metabolic Panel:  Recent Labs Lab 12/23/2016 1426 12/23/16 0440 12/24/16 0805 12/25/16 0556  NA  146* 145 140 144  K 3.7 3.6 4.3 3.9  CL 110 110 108 111  CO2 GLUCOSE 106* 109* 130* 113*  BUN 41* 39* 49* 49*  CREATININE 1.90* 1.78* 2.02* 1.98*  CALCIUM 8.8* 8.7* 8.8* 8.9  MG 2.1  --   --   --   PHOS 3.2  --   --   --    GFR: Estimated Creatinine Clearance: 24.7 mL/min (A) (by C-G formula based on SCr of 1.98 mg/dL (H)). Liver Function Tests: No results for input(s): AST, ALT, ALKPHOS, BILITOT, PROT, ALBUMIN in the last 168 hours. No results for input(s): LIPASE, AMYLASE in the last 168 hours. No results for input(s): AMMONIA in the last 168 hours. Coagulation Profile: No results for input(s): INR, PROTIME in the last 168 hours. Cardiac Enzymes: No results for input(s): CKTOTAL, CKMB, CKMBINDEX, TROPONINI in the last 168  hours. BNP (last 3 results) No results for input(s): PROBNP in the last 8760 hours. HbA1C: No results for input(s): HGBA1C in the last 72 hours. CBG: No results for input(s): GLUCAP in the last 168 hours. Lipid Profile: No results for input(s): CHOL, HDL, LDLCALC, TRIG, CHOLHDL, LDLDIRECT in the last 72 hours. Thyroid Function Tests: No results for input(s): TSH, T4TOTAL, FREET4, T3FREE, THYROIDAB in the last 72 hours. Anemia Panel: No results for input(s): VITAMINB12, FOLATE, FERRITIN, TIBC, IRON, RETICCTPCT in the last 72 hours. Urine analysis:    Component Value Date/Time   COLORURINE YELLOW 12/25/2016 1500   APPEARANCEUR HAZY (A) 12/21/2016 1500   LABSPEC 1.019 12/26/2016 1500   PHURINE 5.0 12/08/2016 1500   GLUCOSEU NEGATIVE 12/26/2016 1500   HGBUR LARGE (A) 12/28/2016 1500   BILIRUBINUR NEGATIVE 12/26/2016 1500   KETONESUR NEGATIVE 12/28/2016 1500   PROTEINUR 100 (A) 12/10/2016 1500   NITRITE NEGATIVE 12/31/2016 1500   LEUKOCYTESUR TRACE (A) 12/06/2016 1500   Sepsis Labs: (procalcitonin:4,lacticidven:4)   Recent Results (from the past 240 hour(s))  Culture, blood (Routine X 2) w Reflex to ID Panel     Status: None (Preliminary result)   Collection Time: 12/04/2016  2:26 PM  Result Value Ref Range Status   Specimen Description BLOOD RIGHT ANTECUBITAL  Final   Special Requests   Final    BOTTLES DRAWN AEROBIC AND ANAEROBIC Blood Culture adequate volume   Culture NO GROWTH 2 DAYS  Final   Report Status PENDING  Incomplete  Culture, blood (Routine X 2) w Reflex to ID Panel     Status: None (Preliminary result)   Collection Time: 12/29/2016  3:00 PM  Result Value Ref Range Status   Specimen Description BLOOD RIGHT WRIST  Final   Special Requests   Final    BOTTLES DRAWN AEROBIC AND ANAEROBIC Blood Culture adequate volume   Culture NO GROWTH 2 DAYS  Final   Report Status PENDING  Incomplete      Radiology Studies: Dg Chest 2 View  Result Date:  12/12/2016 CLINICAL DATA:  Lethargy, atrial fibrillation, hypertension, pneumonia EXAM: CHEST  2 VIEW COMPARISON:  12/18/2016 FINDINGS: Cardiomegaly evident with worsening diffuse bilateral airspace process versus edema. Superimposed CHF is favored. Developing effusions noted layering posteriorly. Dense left lower lobe retrocardiac consolidation persist. No pneumothorax. Aorta is atherosclerotic and tortuous. Trachea is midline. Bones are osteopenic. Degenerative changes of the spine. Increased thoracic kyphosis noted. Mild lower thoracic compression fracture, age indeterminate. IMPRESSION: Cardiomegaly with worsening diffuse airspace process versus edema and pleural effusions. CHF is favored. Dense left lower lobe collapse/consolidation. Pneumonia not  excluded. Electronically Signed   By: Judie Petit.  Shick M.D.   On: 12/15/2016 18:27   Dg Chest Port 1 View  Result Date: 12/24/2016 CLINICAL DATA:  Hypoxia EXAM: PORTABLE CHEST 1 VIEW COMPARISON:  12/06/2016 FINDINGS: Cardiac shadow remains enlarged. Diffuse airspace disease is again identified throughout both lungs. No pneumothorax is seen. No bony abnormality is noted. IMPRESSION: Stable bilateral airspace disease. Electronically Signed   By: Alcide Clever M.D.   On: 12/24/2016 09:14     Scheduled Meds: . aspirin EC  81 mg Oral Daily  . donepezil  10 mg Oral QHS  . feeding supplement  237 mL Oral TID BM  . heparin  5,000 Units Subcutaneous Q8H  . levalbuterol  1.25 mg Nebulization TID  . memantine  10 mg Oral Daily  . metoprolol tartrate  50 mg Oral QID  . pantoprazole   40 mg Intravenous Q24H   Continuous Infusions: . sodium chloride 50 mL/hr at 12/23/16 1121  . vancomycin Stopped (12/24/16 1321)     LOS: 3 days    Time spent: 25 minutes  Greater than 50% of the time spent on counseling and coordinating the care.   Manson Passey, MD [facility] Pager [phone]  If 7PM-7AM, please contact night-coverage [email or web address] Password  [facility] 12/25/2016, 10:24 AM

## 2016-12-25 NOTE — Progress Notes (Signed)
BBSH now w/ slight improvement in aeration of left lung.  BBSH w/ right lung clear coarse, left lung= LUL clear diminished, LLL diminished.  No distress currently noted, VSS.  Weaning fio2 sat now 100-99% on 70% fio2.

## 2016-12-25 NOTE — Progress Notes (Signed)
Pt appears more awake/agitated today.  Moaning, yelling and moving arms and legs around.  Placed pt on [place],sat 95-98% on 6 lpm.  RN aware.  No distress currently noted.  BBSH clear currently.

## 2016-12-26 ENCOUNTER — Inpatient Hospital Stay (HOSPITAL_COMMUNITY): Payer: Medicare HMO

## 2016-12-26 DIAGNOSIS — I38 Endocarditis, valve unspecified: Secondary | ICD-10-CM

## 2016-12-26 LAB — BLOOD GAS, ARTERIAL
Acid-base deficit: 2.4 mmol/L — ABNORMAL HIGH (ref 0.0–2.0)
Bicarbonate: 25 mmol/L (ref 20.0–28.0)
DELIVERY SYSTEMS: POSITIVE
DRAWN BY: 36277
EXPIRATORY PAP: 8
FIO2: 100
INSPIRATORY PAP: 16
O2 SAT: 98.7 %
PCO2 ART: 70.4 mmHg — AB (ref 32.0–48.0)
PH ART: 7.177 — AB (ref 7.350–7.450)
PO2 ART: 185 mmHg — AB (ref 83.0–108.0)
Patient temperature: 98.6

## 2016-12-26 LAB — BASIC METABOLIC PANEL
ANION GAP: 8 (ref 5–15)
BUN: 51 mg/dL — ABNORMAL HIGH (ref 6–20)
CALCIUM: 8.9 mg/dL (ref 8.9–10.3)
CHLORIDE: 109 mmol/L (ref 101–111)
CO2: 27 mmol/L (ref 22–32)
Creatinine, Ser: 1.99 mg/dL — ABNORMAL HIGH (ref 0.61–1.24)
GFR calc non Af Amer: 30 mL/min — ABNORMAL LOW (ref >60)
GFR, EST AFRICAN AMERICAN: 35 mL/min — AB (ref >60)
GLUCOSE: 103 mg/dL — AB (ref 65–99)
Potassium: 3.6 mmol/L (ref 3.5–5.1)
Sodium: 144 mmol/L (ref 135–145)

## 2016-12-26 LAB — GLUCOSE, CAPILLARY
GLUCOSE-CAPILLARY: 123 mg/dL — AB (ref 65–99)
GLUCOSE-CAPILLARY: 110 mg/dL — AB (ref 65–99)

## 2016-12-26 LAB — CBC
HEMATOCRIT: 32.7 % — AB (ref 39.0–52.0)
HEMOGLOBIN: 10.3 g/dL — AB (ref 13.0–17.0)
MCH: 31.5 pg (ref 26.0–34.0)
MCHC: 31.5 g/dL (ref 30.0–36.0)
MCV: 100 fL (ref 78.0–100.0)
Platelets: 195 10*3/uL (ref 150–400)
RBC: 3.27 MIL/uL — ABNORMAL LOW (ref 4.22–5.81)
RDW: 14 % (ref 11.5–15.5)
WBC: 11 10*3/uL — AB (ref 4.0–10.5)

## 2016-12-26 LAB — VANCOMYCIN, TROUGH: VANCOMYCIN TR: 28 ug/mL — AB (ref 15–20)

## 2016-12-26 MED ORDER — VANCOMYCIN HCL 500 MG IV SOLR
500.0000 mg | INTRAVENOUS | Status: DC
  Filled 2016-12-26: qty 500

## 2016-12-26 NOTE — Progress Notes (Signed)
PMT progress note.   HPI: 83 year old male with dementia, atrial fibrillation, hypertension who was transferred from [facility] for further treatment of HCAP, blood cx positive for MRSA. Pt had recent episode of endocarditis, treated with IV vanco until 11/29/16. Due to persistent bacteremia he was transferred to [facility] to rule out recurrent endocarditis.  PMT for goals of care discussions.   Subjective: Mr Brigance is resting in bed, he has his BIPAP on, he has not been able to take it off, even for meal times.   Chart reviews show that the patient has been having episodic oxygen desaturation, has been requiring BIPAP consistently, for more than 24 hours now.  Patient opens his eyes when his name is called, nods "yes" when I ask him if he is ok.  He appears weak, ongoing deconditioning evident.   BP 119/80   Pulse (!) 104   Temp 98 F (36.7 C) (Axillary)   Resp 20   Ht  (1.753 m)   Wt 60 kg (132 lb 4.8 oz)   SpO2 98%   BMI 19.54 kg/m   Labs and imaging noted.   Frail weak elderly gentleman On BIPAP Coarse breath sounds S1 S2 Abdomen soft No edema Awake, not completely alert.   No family at bedside.   A/P:  Presumed endocarditis MRSA bacteremia HCAP Acute hypoxic respiratory failure History of dementia.   PLAN: Call placed and discussed with son HCPOA agent [name] Christell Constant:  He is appreciative of the information he has received from [facility] MD and other staff.  Goals of care:  Patient's son states that he wants to continue current mode of care, should the patient not be able to support the work of his breathing, he now wishes to have intubation and mechanical ventilation.   Goals and wishes pertaining to artificial nutrition and hydration also discussed, patient's son wishes for appropriate artificial nutrition and hydration options, including PEG placement.   We did spend some time reviewing the patient's overall condition, his age, underlying dementia, he had  bacteremia/endocarditis few months ago. We discussed that in spite of aggressive, heroic measures, the patient may be at a high risk for ongoing decline/decompensation. Patient's son understands, all of the above, how ever, at this time, goals are for any and all life maintaining/life prolonging measures to continue and not towards comfort philosophy of care.   We will follow along prn, monitor disease trajectory and help guide appropriate decision making.   35 minutes spent.   Rosalin Hawking MD [facility] health palliative medicine team [phone]

## 2016-12-26 NOTE — Progress Notes (Signed)
Pharmacy Antibiotic Note  Jon Cross is a 83 y.o. male admitted on 01-08-17 with MRSA endocarditis and admitted from [facility] where he was receiving IV Vancomycin.  Pharmacy has been consulted for continuation of his IV Vancomycin management. He is noted with a recent history of MRSA endocarditis and completed therapy on 11/29/16. ID following- plans will likely be a prolonged course of antibiotics.   -vancomycin level= 28 (goal 15-20) -SCr= 1.99, CrCl ~ 25 -  IV vancomycin given at noon today  Plan: - Change vancomycin to  IV q24hr (next dose at 10pm on 9/25) -Will follow renal function, cultures and clinical progress   Height:  (175.3 cm) Weight: 132 lb 4.8 oz (60 kg) IBW/kg (Calculated) : 70.7  Temp (24hrs), Avg:98 F (36.7 C), Min:97.9 F (36.6 C), Max:98 F (36.7 C)   Recent Labs Lab 08-Jan-2017 1426 12/23/16 0440 12/24/16 0805 12/25/16 0556 12/26/16 0337 12/26/16 1116  WBC 10.2 11.2* 12.0* 12.5* 11.0*  --   CREATININE 1.90* 1.78* 2.02* 1.98* 1.99*  --   VANCOTROUGH  --   --   --   --   --  28*    Estimated Creatinine Clearance: 24.7 mL/min (A) (by C-G formula based on SCr of 1.99 mg/dL (H)).    Allergies  Allergen Reactions  . Dristan Cold [Chlorphen-Pe-Acetaminophen] Nausea Only  . Sulfonamide Derivatives Nausea Only    Antimicrobials this admission: Vancomycin 9/17 (started at [place])>>    Microbiology results: 9/20 blood x2- ngtd   Jon Cross, Pharm D 12/26/2016 2:07 PM

## 2016-12-26 NOTE — Significant Event (Addendum)
Rapid Response Event Note  Overview: Called to 2Z30 d/t patient's sats-60s  Time Called: 1911 Arrival Time: 1915 Event Type: Neurologic, Respiratory  Initial Focused Assessment: Pt laying in bed, sats-94% on 100% bipap (RT at bedside PTA RRT and placed pt back on bipap), HR-130s afib, RR-27, BP-107/76.  Lungs rh t/o with very little air mvmt.  Pt responsive only to sternal rub.  Interventions: ABG-7.17, CO2-70.4, PO2-185, bicarb-25.  PCXR-B pulm opacities and B small effusions.  CBG-123. Blount, NP to bedside.  Pt transferred to 2M09 for possible intubation.  Update:  Pt made full DNR.   Plan of Care (if not transferred):  Event Summary: Name of Physician Notified: Bruna Potter, [facility] NP at 1920  Name of Consulting Physician Notified: Leitha Bleak, CCM NP at 2000  Outcome: Transferred (Comment) [phone])  Event End Time: 2005  Terrilyn Saver

## 2016-12-26 NOTE — Consult Note (Signed)
PULMONARY / CRITICAL CARE MEDICINE   Name: Jon Cross MRN: [id number] DOB: Sep 13, 1932    ADMISSION DATE:  12/26/2016 CONSULTATION DATE:  12/26/2016  REFERRING MD:  Dr. Elisabeth Pigeon   CHIEF COMPLAINT:  Hypercarbia/AMS  HISTORY OF PRESENT ILLNESS:   83 year old male with PMH of Dementia/Alzheimer, A.Fib, HTN  Presented to [facility] 9/17 with HCAP and MRSA bacteremia and recurrence of endocarditis with mitral involvement. Treated with IV Vancomycin until 8/28. Transferred to [facility] on 9/20 for further management. During stay patient has been dependent on BiPAP (only coming off for meals). On 9/24 patient found to be severely hypoxic and non-responsive and transferred to ICU. ABG 7.17/70.4/185   PAST MEDICAL HISTORY :  He  has a past medical history of Alzheimer disease; Atrial fibrillation (HCC); Essential hypertension, benign; Herpes zoster; and Staphylococcal skin infection.  PAST SURGICAL HISTORY: He  has a past surgical history that includes Inguinal hernia repair; Tibia fracture surgery; Tonsillectomy; Umbilical hernia repair; and TEE without cardioversion (N/A, 10/20/2016).  Allergies  Allergen Reactions  . Dristan Cold [Chlorphen-Pe-Acetaminophen] Nausea Only  . Sulfonamide Derivatives Nausea Only    No current facility-administered medications on file prior to encounter.    Current Outpatient Prescriptions on File Prior to Encounter  Medication Sig  . aspirin EC 81 MG tablet Take 81 mg by mouth daily.  . memantine (NAMENDA) 10 MG tablet Take 1 tablet (10 mg total) by mouth daily.  . vitamin B-12 500 MCG tablet Take 1 tablet (500 mcg total) by mouth daily.  Marland Kitchen trimethoprim-polymyxin b (POLYTRIM) ophthalmic solution Place 1 drop into the right eye every 4 (four) hours. X 3 days (Patient not taking: Reported on 01/01/2017)    FAMILY HISTORY:  His indicated that the status of his unknown relative is unknown.    SOCIAL HISTORY: He  reports that he has been smoking Cigarettes.   He has a 31.00 pack-year smoking history. He has never used smokeless tobacco. He reports that he drinks alcohol. He reports that he does not use drugs.  REVIEW OF SYSTEMS:   Unable to review as patient is encephalopathic   SUBJECTIVE:    VITAL SIGNS: BP (!) 156/91   Pulse (!) 59   Temp 97.7 F (36.5 C) (Oral)   Resp (!) 27   Ht  (1.753 m)   Wt 60 kg (132 lb 4.8 oz)   SpO2 90%   BMI 19.54 kg/m   HEMODYNAMICS:    VENTILATOR SETTINGS: FiO2 (%):  [40 %-70 %] 50 %  INTAKE / OUTPUT: I/O last 3 completed shifts: In: 0  Out: 870 [Urine:870]  PHYSICAL EXAMINATION: General:  Elderly male Neuro:  Moans and opens eyes to sternal rub, withdrawals to pain  HEENT:  Bipap in place  Cardiovascular:  Irregular, no MRG  Lungs:  Rhonchi, labored, no wheeze/crackles  Abdomen:  Non-distended, active bowel sounds  Musculoskeletal:  -edema  Skin:  Warm, dry   LABS:  BMET  Recent Labs Lab 12/24/16 0805 12/25/16 0556 12/26/16 0337  NA 140 144 144  K 4.3 3.9 3.6  CL 108 111 109  CO2 BUN 49* 49* 51*  CREATININE 2.02* 1.98* 1.99*  GLUCOSE 130* 113* 103*    Electrolytes  Recent Labs Lab 12/10/2016 1426  12/24/16 0805 12/25/16 0556 12/26/16 0337  CALCIUM 8.8*  < > 8.8* 8.9 8.9  MG 2.1  --   --   --   --   PHOS 3.2  --   --   --   --   < > =  values in this interval not displayed.  CBC  Recent Labs Lab 12/24/16 0805 12/25/16 0556 12/26/16 0337  WBC 12.0* 12.5* 11.0*  HGB 11.2* 10.4* 10.3*  HCT 36.4* 34.1* 32.7*  PLT 151 181 195    Coag's No results for input(s): APTT, INR in the last 168 hours.  Sepsis Markers No results for input(s): LATICACIDVEN, PROCALCITON, O2SATVEN in the last 168 hours.  ABG  Recent Labs Lab 12/24/16 0802 12/26/16 1915  PHART 7.229* 7.177*  PCO2ART 62.7* 70.4*  PO2ART 108 185*    Liver Enzymes No results for input(s): AST, ALT, ALKPHOS, BILITOT, ALBUMIN in the last 168 hours.  Cardiac Enzymes No results for  input(s): TROPONINI, PROBNP in the last 168 hours.  Glucose No results for input(s): GLUCAP in the last 168 hours.  Imaging Dg Chest Port 1 View  Result Date: 12/26/2016 CLINICAL DATA:  Acute respiratory distress. EXAM: PORTABLE CHEST 1 VIEW COMPARISON:  Chest radiograph 12/24/2016. FINDINGS: Pacer pad overlies the left hemithorax. Monitoring leads overlie the patient. Stable cardiomegaly. Similar-appearing diffuse bilateral heterogeneous pulmonary opacities. Layering small to moderate bilateral pleural effusions. IMPRESSION: Re- demonstrated diffuse bilateral heterogeneous pulmonary opacities and small to moderate layering bilateral effusions. Electronically Signed   By: Annia Belt M.D.   On: 12/26/2016 19:53     STUDIES:  CXR 9/20 > Cardiomegaly with worsening diffuse airspace process versus edema and pleural effusions. CHF is favored. Dense left lower lobe collapse/consolidation. Pneumonia not excluded CXR 9/22 > Cardiac shadow remains enlarged. Diffuse airspace disease is again identified throughout both lungs. No pneumothorax is seen. No bony abnormality is noted. CXR 9/24 > Pacer pad overlies the left hemithorax. Monitoring leads overlie the patient. Stable cardiomegaly. Similar-appearing diffuse bilateral heterogeneous pulmonary opacities. Layering small to moderate bilateral pleural effusions.  CULTURES: Blood 9/20 > Negative   ANTIBIOTICS: Vancomycin 9/21 >>>   SIGNIFICANT EVENTS: 9/20 > Presents to [facility]  9/24 > Transferred to ICU for hypoxia/AMS   LINES/TUBES: PIV   DISCUSSION: 83 year old male with recurrent MRSA PNA Bacteremia transferred to ICU on 9/24 for progressive hypoxia and encephalopathy    ASSESSMENT / PLAN:  PULMONARY A: Acute Hypercarbic/Hypoxic Respiratory Failure in setting of HCAP  P:   BiPAP PRN  Trend CXR/ABG  Continue Xopenx TID  Maintain Oxygen Saturation >92  CARDIOVASCULAR A:  Endocarditis  A.Fib  PAT/PAC  H/O HTN P:  Cardiac  Monitoring  Cardiology consulted > poor surgical candidate  Continue ASA and metoprolol   RENAL A:   CKD stage 3 (Base Crt 1.6-1.8)  P:   Trend BMP  1/2 NS @ 50 ml/hr   GASTROINTESTINAL A:   Severe protein calorie malnutrition  P:   NPO PPI  Will have Cortrack placed in AM  HEMATOLOGIC A:   Anemia of Chronic Diease  P:  Trend CBC  Maintain Hbg >7   INFECTIOUS A:   Endocarditis with MRSA Bacteremia  P:   ID following  Continue Vancomycin   ENDOCRINE A:   No issues    P:   Trend Glucose   NEUROLOGIC A:   Acute Encephalopathy in setting of hypercarbia and bacteremia   Dementia P:   Monitor  Avoid Sedative Medications  Resume Aricept/Namenda when cortrack placed    FAMILY  - Updates: Family updated 9/24. Understands severity of disease and would like to proceed with DNR. If patient were to decompensate will transition to comfort care.   CC Time: 45 minutes  Jovita Kussmaul, AGACNP-BC [facility]  Pgr: [phone]  PCCM Pgr: [phone]

## 2016-12-26 NOTE — Plan of Care (Signed)
Problem: Safety: Goal: Ability to remain free from injury will improve Outcome: Progressing No injuries or skin break down this shift. Sacral foam pad placed to prevent skin breakdown. Pt repositioned q2h.   Problem: Tissue Perfusion: Goal: Risk factors for ineffective tissue perfusion will decrease Outcome: Progressing Respiratory suctioned pt and adjusted BiPAP. Pt's O2 sat has remained wnl. Will continue to monitor and assess. Pt continuously monitored via pulse oximetry and telemetry.

## 2016-12-26 NOTE — Progress Notes (Signed)
Placed patient back on bipap due to decrease in SP02 level.

## 2016-12-26 NOTE — Progress Notes (Signed)
Called into the room with reported o2 saturation at 55-59 %. Pt noted to be unresponsive, cold and clammy/diaphoretic. Pt noted to have gurgling sounds. Pt on 15 liters o2 Hi flow. Pt deep suctioned at this time. CBG  taken at 123. Rapid response called . RT called to evaluate pt. Hospitalist called in to see pt. BP 107/76.

## 2016-12-26 NOTE — Progress Notes (Signed)
Patient ID: Jon Cross, male   DOB: November 04, 1932, 83 y.o.   MRN: [id number]  PROGRESS NOTE    Jon Cross  WUJ:[id number] DOB: 1933/03/04 DOA: 01/11/2017  PCP: Joette Catching, MD   Brief Narrative:  83 year old male with dementia, atrial fibrillation, hypertension who was transferred from [facility] for further treatment of HCAP, blood cx positive for MRSA. Pt had recent episode of endocarditis, treated with IV vanco until 11/29/16. Due to persistent bacteremia he was transferred to [facility] to rule out recurrent endocarditis.   Assessment & Plan:   Active Problems: Endocarditis with mrsa bacteremia / Leukocytosis  - Patient is currently on vancomycin, infectious disease will see him and recommended duration of antibiotics  Acute respiratory failure with hypoxia - CXR on 9/22 showed stable bilateral airspace disease - Patient is on BiPAP most of the time  Healthcare associated pneumonia  - Continue IV Vanco - I spoke with ID, they will see him and recommend the duration of vanco   Chronic kidney disease stage III - Baseline creatinine is 1.78 in July 2018 - Creatinine elevated on the admission at 2.02, likely in the setting of acute infection - Monitor BMP daily, especially since patient is on vancomycin - Creatinine is 1.99 this morning  Anemia of chronic kidney disease - Hemoglobin is stable at 10.3  Essential hypertension - Continue metoprolol 50 mg 4 times daily  PAT/PAC / occasional tachycardia - Seen by cardiology in consultation - Rhythm is sinus tachycardia, paroxysmal atrial tachycardia, does not appear to be in atrial fibrillation - Continue metoprolol - Continue aspirin   Dementia without behavior disturbance - Continue namenda and aricept   Severe protein calorie malnutrition  - In the context of chronic illness - Now he is almost dependent on BiPAP and unable to have any by mouth intake  - I tried calling patient's son, left voicemail to call me  back so can discuss with him the nutritional status   DVT prophylaxis: subQ Heparin  Code Status: Partial code, no CPR or intubation but okay for BiPAP and meds Family Communication: no family at the bedside, left VM on pt son cell number to call me back for updates  Disposition Plan: not yet stable for discharge, on BiPAP   Consultants:   Infectious disease  Cardiology  Palliative care  Procedures:   None  Antimicrobials:   Vancomycin   Subjective: No overnight events.   Objective: Vitals:   12/26/16 0544 12/26/16 0600 12/26/16 0822 12/26/16 0823  BP:  119/80  119/80  Pulse: 81 87  (!) 104  Resp: Temp:  98 F (36.7 C)    TempSrc:  Axillary    SpO2: 97% 96% 96% 98%  Weight:      Height:        Intake/Output Summary (Last 24 hours) at 12/26/16 0843 Last data filed at 12/25/16 1848  Gross per 24 hour  Intake                0 ml  Output              450 ml  Net             -450 ml   Filed Weights   12/24/16 0511 12/25/16 0520 12/26/16 0500  Weight: 59.6 kg (131 lb 8 oz) 59.8 kg (131 lb 12.8 oz) 60 kg (132 lb 4.8 oz)    Examination:  General exam: Appears calm and comfortable  Respiratory system: Coarse breath sounds, diminished Cardiovascular system: S1 & S2 heard, irregular rhythm, systolic ejection murmur +3 appreciated Gastrointestinal system: Abdomen is nondistended, soft and nontender. No organomegaly or masses felt. Normal bowel sounds heard. Central nervous system: Patient is not oriented, he is demented, confused Extremities: No swelling, no tenderness Skin: No rashes, lesions or ulcers Psychiatry: Not agitated or restless  Data Reviewed: I have personally reviewed following labs and imaging studies  CBC:  Recent Labs Lab 2017/01/05 1426 12/23/16 0440 12/24/16 0805 12/25/16 0556 12/26/16 0337  WBC 10.2 11.2* 12.0* 12.5* 11.0*  NEUTROABS 8.3*  --  8.9*  --   --   HGB 11.0* 10.9* 11.2* 10.4* 10.3*  HCT 35.0* 35.0* 36.4* 34.1*  32.7*  MCV 100.0 100.9* 102.2* 100.0 100.0  PLT 122* 130* 151 181 195   Basic Metabolic Panel:  Recent Labs Lab 2017-01-05 1426 12/23/16 0440 12/24/16 0805 12/25/16 0556 12/26/16 0337  NA 146* 145 140 144 144  K 3.7 3.6 4.3 3.9 3.6  CL 110 110 108 111 109  CO2 GLUCOSE 106* 109* 130* 113* 103*  BUN 41* 39* 49* 49* 51*  CREATININE 1.90* 1.78* 2.02* 1.98* 1.99*  CALCIUM 8.8* 8.7* 8.8* 8.9 8.9  MG 2.1  --   --   --   --   PHOS 3.2  --   --   --   --    GFR: Estimated Creatinine Clearance: 24.7 mL/min (A) (by C-G formula based on SCr of 1.99 mg/dL (H)). Liver Function Tests: No results for input(s): AST, ALT, ALKPHOS, BILITOT, PROT, ALBUMIN in the last 168 hours. No results for input(s): LIPASE, AMYLASE in the last 168 hours. No results for input(s): AMMONIA in the last 168 hours. Coagulation Profile: No results for input(s): INR, PROTIME in the last 168 hours. Cardiac Enzymes: No results for input(s): CKTOTAL, CKMB, CKMBINDEX, TROPONINI in the last 168 hours. BNP (last 3 results) No results for input(s): PROBNP in the last 8760 hours. HbA1C: No results for input(s): HGBA1C in the last 72 hours. CBG: No results for input(s): GLUCAP in the last 168 hours. Lipid Profile: No results for input(s): CHOL, HDL, LDLCALC, TRIG, CHOLHDL, LDLDIRECT in the last 72 hours. Thyroid Function Tests: No results for input(s): TSH, T4TOTAL, FREET4, T3FREE, THYROIDAB in the last 72 hours. Anemia Panel: No results for input(s): VITAMINB12, FOLATE, FERRITIN, TIBC, IRON, RETICCTPCT in the last 72 hours. Urine analysis:    Component Value Date/Time   COLORURINE YELLOW 01-05-2017 1500   APPEARANCEUR HAZY (A) 2017/01/05 1500   LABSPEC 1.019 2017/01/05 1500   PHURINE 5.0 05-Jan-2017 1500   GLUCOSEU NEGATIVE 01-05-17 1500   HGBUR LARGE (A) January 05, 2017 1500   BILIRUBINUR NEGATIVE 2017/01/05 1500   KETONESUR NEGATIVE 05-Jan-2017 1500   PROTEINUR 100 (A) Jan 05, 2017 1500   NITRITE  NEGATIVE 2017/01/05 1500   LEUKOCYTESUR TRACE (A) 01/05/17 1500   Sepsis Labs: (procalcitonin:4,lacticidven:4)    Culture, blood (Routine X 2) w Reflex to ID Panel     Status: None (Preliminary result)   Collection Time: 05-Jan-2017  2:26 PM  Result Value Ref Range Status   Specimen Description BLOOD RIGHT ANTECUBITAL  Final   Special Requests   Final    BOTTLES DRAWN AEROBIC AND ANAEROBIC Blood Culture adequate volume   Culture NO GROWTH 3 DAYS  Final   Report Status PENDING  Incomplete  Culture, blood (Routine X 2) w Reflex to ID Panel     Status: None (Preliminary result)  Collection Time: 12/10/2016  3:00 PM  Result Value Ref Range Status   Specimen Description BLOOD RIGHT WRIST  Final   Special Requests   Final    BOTTLES DRAWN AEROBIC AND ANAEROBIC Blood Culture adequate volume   Culture NO GROWTH 3 DAYS  Final   Report Status PENDING  Incomplete  MRSA PCR Screening     Status: Abnormal   Collection Time: 12/25/16  4:01 PM  Result Value Ref Range Status   MRSA by PCR INVALID RESULTS, SPECIMEN SENT FOR CULTURE (A) NEGATIVE Final      Radiology Studies: Dg Chest 2 View  Result Date: 12/07/2016 CLINICAL DATA:  Lethargy, atrial fibrillation, hypertension, pneumonia EXAM: CHEST  2 VIEW COMPARISON:  12/18/2016 FINDINGS: Cardiomegaly evident with worsening diffuse bilateral airspace process versus edema. Superimposed CHF is favored. Developing effusions noted layering posteriorly. Dense left lower lobe retrocardiac consolidation persist. No pneumothorax. Aorta is atherosclerotic and tortuous. Trachea is midline. Bones are osteopenic. Degenerative changes of the spine. Increased thoracic kyphosis noted. Mild lower thoracic compression fracture, age indeterminate. IMPRESSION: Cardiomegaly with worsening diffuse airspace process versus edema and pleural effusions. CHF is favored. Dense left lower lobe collapse/consolidation. Pneumonia not excluded. Electronically Signed   By: Judie Petit.   Shick M.D.   On: 12/24/2016 18:27   Dg Chest Port 1 View  Result Date: 12/24/2016 CLINICAL DATA:  Hypoxia EXAM: PORTABLE CHEST 1 VIEW COMPARISON:  12/18/2016 FINDINGS: Cardiac shadow remains enlarged. Diffuse airspace disease is again identified throughout both lungs. No pneumothorax is seen. No bony abnormality is noted. IMPRESSION: Stable bilateral airspace disease. Electronically Signed   By: Alcide Clever M.D.   On: 12/24/2016 09:14      Scheduled Meds: . aspirin EC  81 mg Oral Daily  . donepezil  10 mg Oral QHS  . feeding supplement (ENSURE ENLIVE)  237 mL Oral TID BM  . heparin  5,000 Units Subcutaneous Q8H  . levalbuterol  1.25 mg Nebulization TID  . memantine  10 mg Oral Daily  . metoprolol tartrate  50 mg Oral QID  . pantoprazole (PROTONIX) IV  40 mg Intravenous Q24H   Continuous Infusions: . sodium chloride 50 mL/hr at 12/26/16 0046  . vancomycin Stopped (12/25/16 1411)     LOS: 4 days    Time spent: 25 minutes  Greater than 50% of the time spent on counseling and coordinating the care.   Manson Passey, MD [facility] Pager [phone]  If 7PM-7AM, please contact night-coverage [email or web address] Password [facility] 12/26/2016, 8:43 AM

## 2016-12-26 NOTE — Progress Notes (Signed)
Occupational Therapy Treatment Patient Details Name: Jon Cross MRN: [id number] DOB: 1932-12-01 Today's Date: 12/26/2016    History of present illness Patient is a 83 y/o male transferred from [place] presents with recurring MRSA bacteremia endocarditis. CXR- small bilateral pleural effusions and cardiomegaly possible overlying infectious process. PMH includes Dementia, Afib, HTN, MRSA bacteremia and endocarditis    OT comments  Pt on bipap with mitts. Desaturates with any removal. Pt awake, eyes mostly closed during session, but opened them to command. Pt otherwise not following commands. 2 person assist to position in bed, although RN reports pt found sitting EOB earlier today. Will follow.   Follow Up Recommendations  SNF;Supervision/Assistance - 24 hour    Equipment Recommendations       Recommendations for Other Services      Precautions / Restrictions Precautions Precautions: Fall Precaution Comments: watch HR, pt on bipap       Mobility Bed Mobility               General bed mobility comments: 2 person assist to position up in bed, pt not following commands when asked to assist, per RN, pt sat himself at EOB independently earlier today.   Transfers                      Balance                                           ADL either performed or assessed with clinical judgement   ADL                                         General ADL Comments: pt unable to remove bipap even to eat or groom, immediately desats per RN     Vision       Perception     Praxis      Cognition Arousal/Alertness: Lethargic Behavior During Therapy: Flat affect Overall Cognitive Status: No family/caregiver present to determine baseline cognitive functioning                                 General Comments: pt nodding head, eyes closed most of session        Exercises     Shoulder Instructions        General Comments      Pertinent Vitals/ Pain       Pain Assessment: Faces Faces Pain Scale: No hurt  Home Living                                          Prior Functioning/Environment              Frequency  Min 2X/week        Progress Toward Goals  OT Goals(current goals can now be found in the care plan section)  Progress towards OT goals: Not progressing toward goals - comment (on bipap)  Acute Rehab OT Goals Patient Stated Goal: none stated OT Goal Formulation: With patient Time For Goal Achievement: 12/30/16 Potential to Achieve Goals: Fair  Plan Discharge plan remains appropriate  Co-evaluation                 AM-PAC PT "6 Clicks" Daily Activity     Outcome Measure   Help from another person eating meals?: Total Help from another person taking care of personal grooming?: Total Help from another person toileting, which includes using toliet, bedpan, or urinal?: A Lot Help from another person bathing (including washing, rinsing, drying)?: A Lot Help from another person to put on and taking off regular upper body clothing?: A Lot Help from another person to put on and taking off regular lower body clothing?: A Lot 6 Click Score: 10    End of Session Equipment Utilized During Treatment: Other (comment) (bipap)  OT Visit Diagnosis: Cognitive communication deficit (R41.841)   Activity Tolerance Patient limited by fatigue;Patient limited by lethargy   Patient Left in bed;with call bell/phone within reach;with bed alarm set;with nursing/sitter in room;with restraints reapplied (mitts)   Nurse Communication Other (comment) (pt full code)        Time: 1150-1205 OT Time Calculation (min): 15 min  Charges: OT General Charges $OT Visit: 1 Visit OT Treatments $Therapeutic Activity: 8-22 mins  12/26/2016 Martie Round, OTR/L Pager: [phone]   [place] [employer] 12/26/2016, 12:54 PM

## 2016-12-26 NOTE — Progress Notes (Signed)
RT called to room at 1905 to assess pt with sats in 50s on 13lpm HFNC.  RT entered to find pt unresponsive to sternal rub and RT placed pt back on bipap at original settings.  RT noticed VT less than 100 on 14/8 and increased pressures to 20/10 where pt was only drawing in VT's of 150.  BS were originally course on expiration but more diminished. RT pulled ABG and gathered result while awaiting MD.  At this time, RT found pt's BS to be completely diminished throughout all fields. RT and Rapid response transported pt to 27M 09 after chest xray was gained. Transported without event but noticed increase in HR and decrease in volumes on vent. This RT handed off pt and report to 27M RT.

## 2016-12-27 DIAGNOSIS — J9601 Acute respiratory failure with hypoxia: Secondary | ICD-10-CM

## 2016-12-27 DIAGNOSIS — E872 Acidosis: Secondary | ICD-10-CM

## 2016-12-27 DIAGNOSIS — J9602 Acute respiratory failure with hypercapnia: Secondary | ICD-10-CM

## 2016-12-27 DIAGNOSIS — G9341 Metabolic encephalopathy: Secondary | ICD-10-CM

## 2016-12-27 DIAGNOSIS — R0902 Hypoxemia: Secondary | ICD-10-CM

## 2016-12-27 LAB — CBC
HEMATOCRIT: 36.9 % — AB (ref 39.0–52.0)
HEMOGLOBIN: 11.1 g/dL — AB (ref 13.0–17.0)
MCH: 31.1 pg (ref 26.0–34.0)
MCHC: 30.1 g/dL (ref 30.0–36.0)
MCV: 103.4 fL — ABNORMAL HIGH (ref 78.0–100.0)
Platelets: 206 10*3/uL (ref 150–400)
RBC: 3.57 MIL/uL — AB (ref 4.22–5.81)
RDW: 14.3 % (ref 11.5–15.5)
WBC: 13.4 10*3/uL — AB (ref 4.0–10.5)

## 2016-12-27 LAB — CULTURE, BLOOD (ROUTINE X 2)
CULTURE: NO GROWTH
Culture: NO GROWTH
SPECIAL REQUESTS: ADEQUATE
Special Requests: ADEQUATE

## 2016-12-27 LAB — BASIC METABOLIC PANEL
ANION GAP: 9 (ref 5–15)
BUN: 56 mg/dL — ABNORMAL HIGH (ref 6–20)
CHLORIDE: 109 mmol/L (ref 101–111)
CO2: 27 mmol/L (ref 22–32)
Calcium: 8.7 mg/dL — ABNORMAL LOW (ref 8.9–10.3)
Creatinine, Ser: 2.19 mg/dL — ABNORMAL HIGH (ref 0.61–1.24)
GFR calc non Af Amer: 27 mL/min — ABNORMAL LOW (ref >60)
GFR, EST AFRICAN AMERICAN: 31 mL/min — AB (ref >60)
GLUCOSE: 111 mg/dL — AB (ref 65–99)
POTASSIUM: 4.3 mmol/L (ref 3.5–5.1)
Sodium: 145 mmol/L (ref 135–145)

## 2016-12-27 LAB — GLUCOSE, CAPILLARY: Glucose-Capillary: 115 mg/dL — ABNORMAL HIGH (ref 65–99)

## 2016-12-28 LAB — MRSA CULTURE: CULTURE: NOT DETECTED

## 2017-01-02 NOTE — Progress Notes (Addendum)
Patient seen and examinedafter being called by RN that pupils have become nonresponsive to light Patient diaphoretic on BiPAP, MAXIMUM TEMPERATURE 95, heart rate in the 70s or 80s Unresponsive to pain, unresponsive to noxious stimuli and extremities are blue Do not think he will make a meaningful recovery  rN will contact family to determine if BiPAP still something the family would want a trial as a temporary doubt this will make any difference long-term final common pathway  I expect will die today  Pleas Koch, MD [facility] ([phone]

## 2017-01-02 NOTE — Discharge Summary (Signed)
Death Summary  Jon Cross WUJ:[id number] DOB: 04-Jan-1933 DOA: 2017-01-03  PCP: Joette Catching, MD  Admit date: 01-03-17 Date of Death: 01/08/2017 Time of Death: 9:12 AM Notification: Joette Catching, MD notified of death of 2017-01-08   History of present illness:  Jon Cross is a 83 y.o. male with a history of Dementia, A. Fib, prior endocarditis with MRSA Admitted to [facility] 12/19/2016 with encephalopathy secondary toHealthcare associated pneumonia Transferred to [facility] Patient was seen by cardiology as well as infectious disease however had a precipitous decline the course of 01/01/24--t was felt that he was was surgical candidate by cardiology and heneeded management for his hypercarbic respiratory failure with BIPAP intermittently Palliative care saw the patient in consult as he was partial code On the evening of 9/24 patient had progressive worsening of mental status and he further declined  He became minimally responsive was seen by the ICU team and was felt that given the burden of his multiple illnesses, he would not recover  I had a long discussion with his son a.m.plan we discussed whether he should be on intermittent BiPAP It was elected by the son he died BiPAP was not appropriate and ultimately the patient expired as above at the above time Carbon copy of this note was sent to her PCPs office  throughout Final Diagnoses:  1.   Hypercarbic respiratory failure in the setting of severe dementia  The results of significant diagnostics from this hospitalization (including imaging, microbiology, ancillary and laboratory) are listed below for reference.    Significant Diagnostic Studies: Dg Chest 2 View  Result Date: 01/03/17 CLINICAL DATA:  Lethargy, atrial fibrillation, hypertension, pneumonia EXAM: CHEST  2 VIEW COMPARISON:  12/18/2016 FINDINGS: Cardiomegaly evident with worsening diffuse bilateral airspace process versus edema.  Superimposed CHF is favored. Developing effusions noted layering posteriorly. Dense left lower lobe retrocardiac consolidation persist. No pneumothorax. Aorta is atherosclerotic and tortuous. Trachea is midline. Bones are osteopenic. Degenerative changes of the spine. Increased thoracic kyphosis noted. Mild lower thoracic compression fracture, age indeterminate. IMPRESSION: Cardiomegaly with worsening diffuse airspace process versus edema and pleural effusions. CHF is favored. Dense left lower lobe collapse/consolidation. Pneumonia not excluded. Electronically Signed   By: Judie Petit.  Shick M.D.   On: 03-Jan-2017 18:27   Dg Chest Port 1 View  Result Date: 12/26/2016 CLINICAL DATA:  Acute respiratory distress. EXAM: PORTABLE CHEST 1 VIEW COMPARISON:  Chest radiograph 12/24/2016. FINDINGS: Pacer pad overlies the left hemithorax. Monitoring leads overlie the patient. Stable cardiomegaly. Similar-appearing diffuse bilateral heterogeneous pulmonary opacities. Layering small to moderate bilateral pleural effusions. IMPRESSION: Re- demonstrated diffuse bilateral heterogeneous pulmonary opacities and small to moderate layering bilateral effusions. Electronically Signed   By: Annia Belt M.D.   On: 12/26/2016 19:53   Dg Chest Port 1 View  Result Date: 12/24/2016 CLINICAL DATA:  Hypoxia EXAM: PORTABLE CHEST 1 VIEW COMPARISON:  Jan 03, 2017 FINDINGS: Cardiac shadow remains enlarged. Diffuse airspace disease is again identified throughout both lungs. No pneumothorax is seen. No bony abnormality is noted. IMPRESSION: Stable bilateral airspace disease. Electronically Signed   By: Alcide Clever M.D.   On: 12/24/2016 09:14    Microbiology: Recent Results (from the past 240 hour(s))  Culture, blood (Routine X 2) w Reflex to ID Panel     Status: None (Preliminary result)   Collection Time: Jan 03, 2017  2:26 PM  Result Value Ref Range Status   Specimen Description BLOOD RIGHT ANTECUBITAL  Final   Special Requests   Final  BOTTLES  DRAWN AEROBIC AND ANAEROBIC Blood Culture adequate volume   Culture NO GROWTH 4 DAYS  Final   Report Status PENDING  Incomplete  Culture, blood (Routine X 2) w Reflex to ID Panel     Status: None (Preliminary result)   Collection Time: 12/09/2016  3:00 PM  Result Value Ref Range Status   Specimen Description BLOOD RIGHT WRIST  Final   Special Requests   Final    BOTTLES DRAWN AEROBIC AND ANAEROBIC Blood Culture adequate volume   Culture NO GROWTH 4 DAYS  Final   Report Status PENDING  Incomplete  MRSA PCR Screening     Status: Abnormal   Collection Time: 12/25/16  4:01 PM  Result Value Ref Range Status   MRSA by PCR INVALID RESULTS, SPECIMEN SENT FOR CULTURE (A) NEGATIVE Final    Comment: RESULT CALLED TO, READ BACK BY AND VERIFIED WITH: C COLUMBO,RN AT 1811 12/25/16 BY L BENFIELD        The GeneXpert MRSA Assay (FDA approved for NASAL specimens only), is one component of a comprehensive MRSA colonization surveillance program. It is not intended to diagnose MRSA infection nor to guide or monitor treatment for MRSA infections.   MRSA culture     Status: None (Preliminary result)   Collection Time: 12/25/16  4:01 PM  Result Value Ref Range Status   Specimen Description NASAL SWAB  Final   Special Requests NONE  Final   Culture CULTURE REINCUBATED FOR BETTER GROWTH  Final   Report Status PENDING  Incomplete     Labs: Basic Metabolic Panel:  Recent Labs Lab 12/19/2016 1426 12/23/16 0440 12/24/16 0805 12/25/16 0556 12/26/16 0337 01-07-2017 0317  NA 146* 145 140 144 144 145  K 3.7 3.6 4.3 3.9 3.6 4.3  CL 110 110 108 111 109 109  CO2 GLUCOSE 106* 109* 130* 113* 103* 111*  BUN 41* 39* 49* 49* 51* 56*  CREATININE 1.90* 1.78* 2.02* 1.98* 1.99* 2.19*  CALCIUM 8.8* 8.7* 8.8* 8.9 8.9 8.7*  MG 2.1  --   --   --   --   --   PHOS 3.2  --   --   --   --   --    Liver Function Tests: No results for input(s): AST, ALT, ALKPHOS, BILITOT, PROT, ALBUMIN in the last  168 hours. No results for input(s): LIPASE, AMYLASE in the last 168 hours. No results for input(s): AMMONIA in the last 168 hours. CBC:  Recent Labs Lab 12/21/2016 1426 12/23/16 0440 12/24/16 0805 12/25/16 0556 12/26/16 0337 Jan 07, 2017 0317  WBC 10.2 11.2* 12.0* 12.5* 11.0* 13.4*  NEUTROABS 8.3*  --  8.9*  --   --   --   HGB 11.0* 10.9* 11.2* 10.4* 10.3* 11.1*  HCT 35.0* 35.0* 36.4* 34.1* 32.7* 36.9*  MCV 100.0 100.9* 102.2* 100.0 100.0 103.4*  PLT 122* 130* 151 181 195 206   Cardiac Enzymes: No results for input(s): CKTOTAL, CKMB, CKMBINDEX, TROPONINI in the last 168 hours. D-Dimer No results for input(s): DDIMER in the last 72 hours. BNP: Invalid input(s): POCBNP CBG:  Recent Labs Lab 12/26/16 1913 12/26/16 2011 January 07, 2017 0754  GLUCAP 123* 110* 115*   Anemia work up No results for input(s): VITAMINB12, FOLATE, FERRITIN, TIBC, IRON, RETICCTPCT in the last 72 hours. Urinalysis    Component Value Date/Time   COLORURINE YELLOW 12/19/2016 1500   APPEARANCEUR HAZY (A) 12/21/2016 1500   LABSPEC 1.019 12/25/2016 1500   PHURINE  5.0 01-21-17 1500   GLUCOSEU NEGATIVE 01-21-2017 1500   HGBUR LARGE (A) 2017-01-21 1500   BILIRUBINUR NEGATIVE 01/21/17 1500   KETONESUR NEGATIVE Jan 21, 2017 1500   PROTEINUR 100 (A) 2017-01-21 1500   NITRITE NEGATIVE 01-21-2017 1500   LEUKOCYTESUR TRACE (A) Jan 21, 2017 1500   Sepsis Labs Invalid input(s): PROCALCITONIN,  WBC,  LACTICIDVEN     SIGNED:  Rhetta Mura, MD  [facility] 12/24/2016, 9:37 AM Pager   If 7PM-7AM, please contact night-coverage [email or web address] Password [email or web address]

## 2017-01-02 NOTE — Progress Notes (Signed)
Called to bedside by RRT with concerns of patient's respiratory status. On arrival, patient on BiPAP, unresponsive to verbal and physical stimuli, RR at 8 bpm,   Interventions: Acute Respiratory Distress - ABG, portable chest xray and lab work ordered. After ABG results obtained patient moved to ICU. CCM called to bedside. Son called to discuss patient condition and code status. Patient made DNR at this time.    Audrea Muscat, NP [facility] [phone]

## 2017-01-02 DEATH — deceased

## 2018-07-23 IMAGING — CT CT HEAD W/O CM
3 series · 15 of 47 positions shown, 18 images · non-contrast
Comparison: None.

CLINICAL DATA: 83-year-old male with altered mental status and
weakness today.

EXAM:
CT HEAD WITHOUT CONTRAST
TECHNIQUE: Contiguous axial images were obtained from the base of the skull
through the vertex without intravenous contrast.

[Series 2: head wo · axial · 0.43mm/px · z∈[+48,+173]mm · 9 of 30 slices shown, 12 images]
[im 3/30  brain]
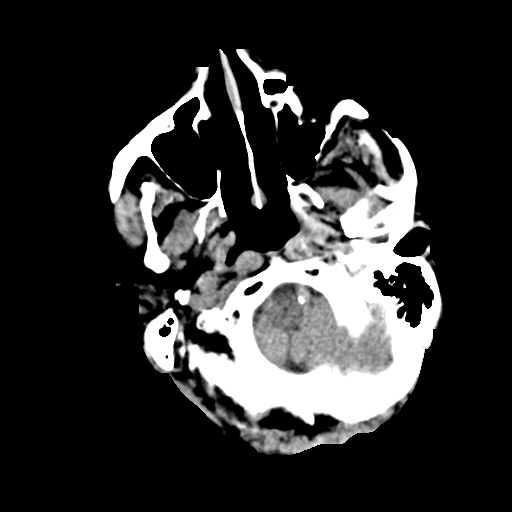
[im 3/30  bone]
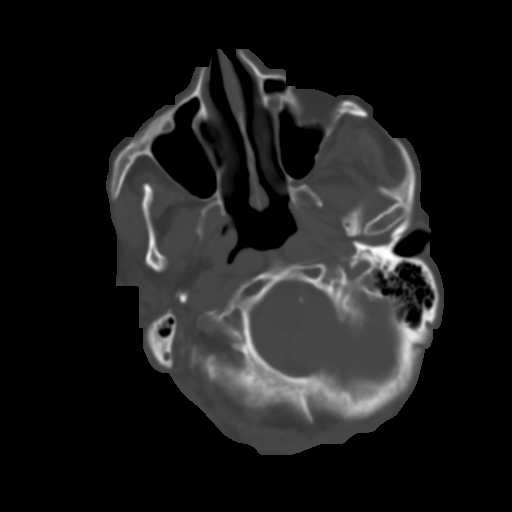
[im 6/30  brain]
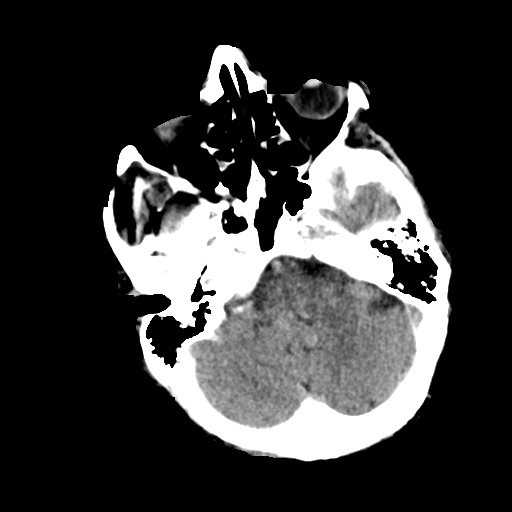
[im 9/30  brain]
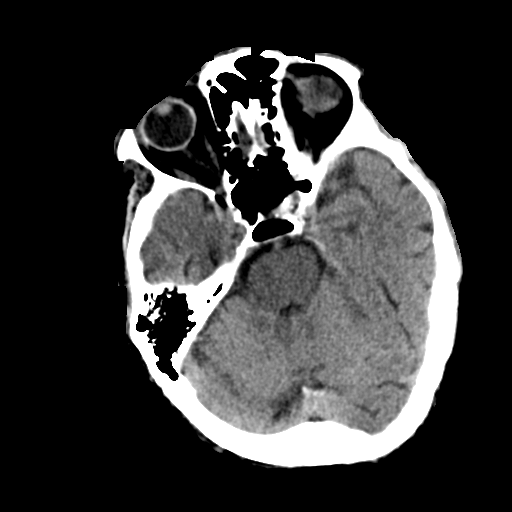
[im 12/30  brain]
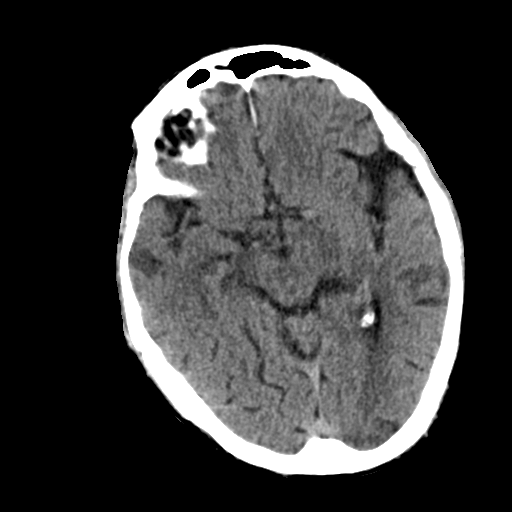
[im 16/30  brain]
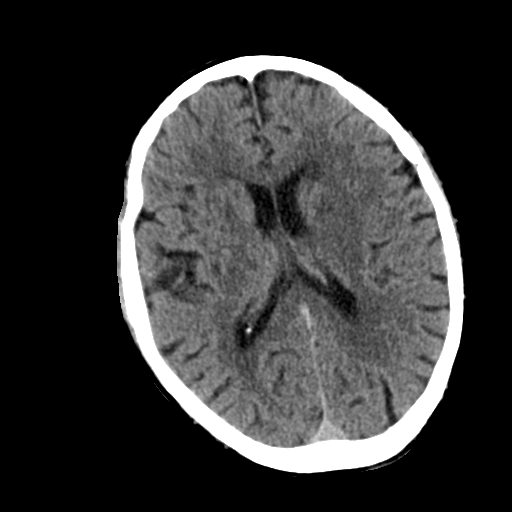
[im 16/30  bone]
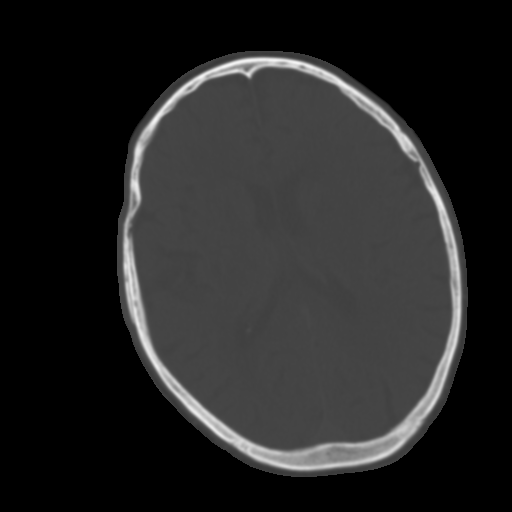
[im 19/30  brain]
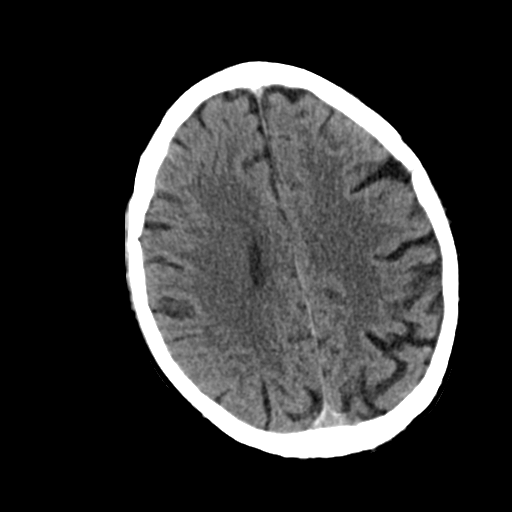
[im 22/30  brain]
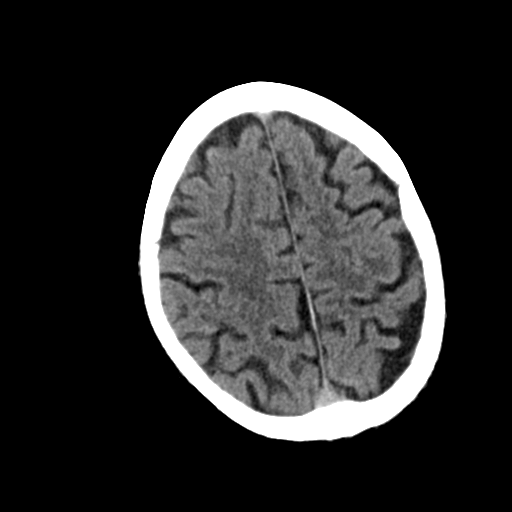
[im 25/30  brain]
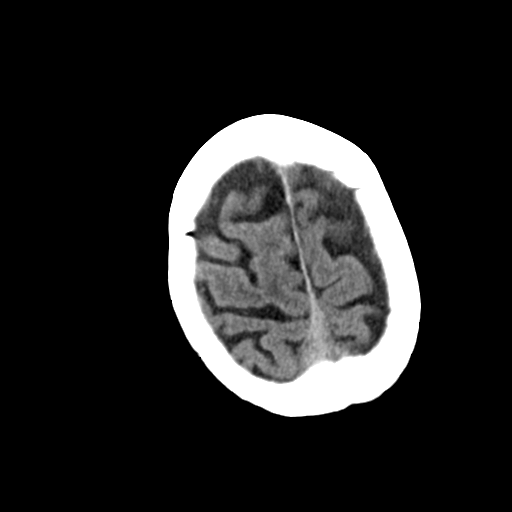
[im 28/30  brain]
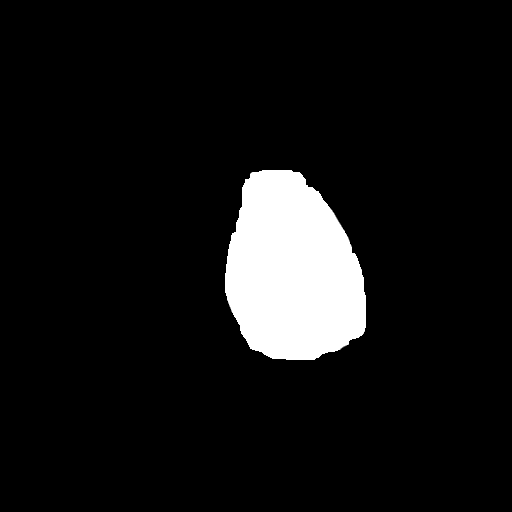
[im 28/30  bone]
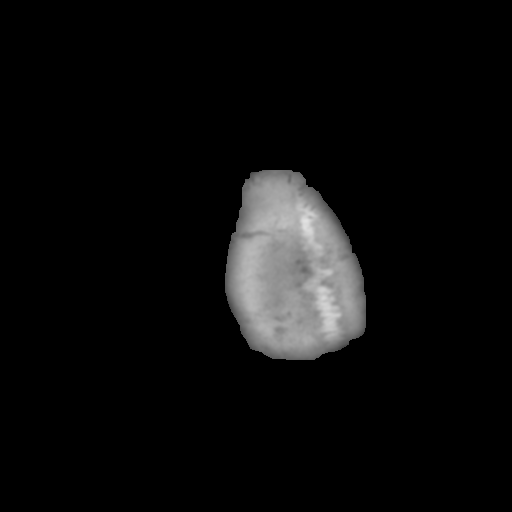

[Series 4: coronal soft tissue · coronal · 0.36mm/px · 3 of 74 slices shown]
[im 25/74  brain]
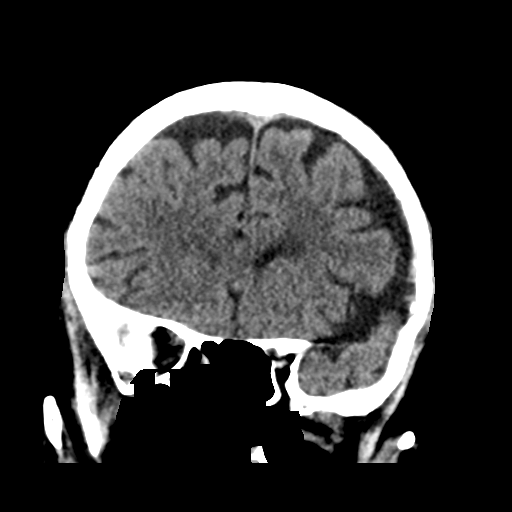
[im 33/74  brain]
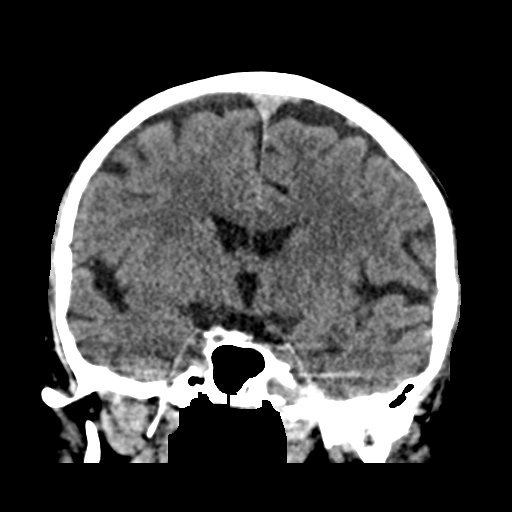
[im 41/74  brain]
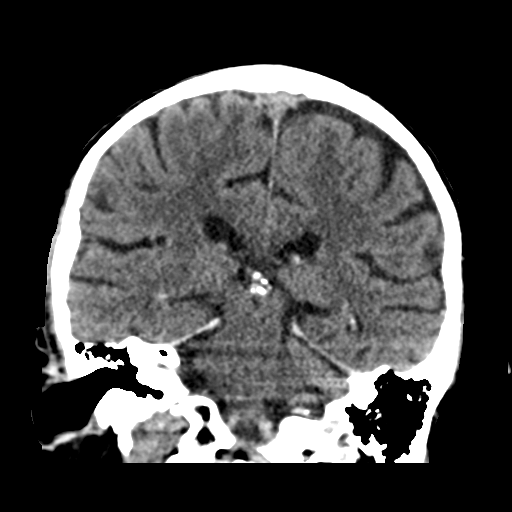

[Series 5: sagittal soft tissue · sagittal · 0.33mm/px · 3 of 67 slices shown]
[im 23/67  brain]
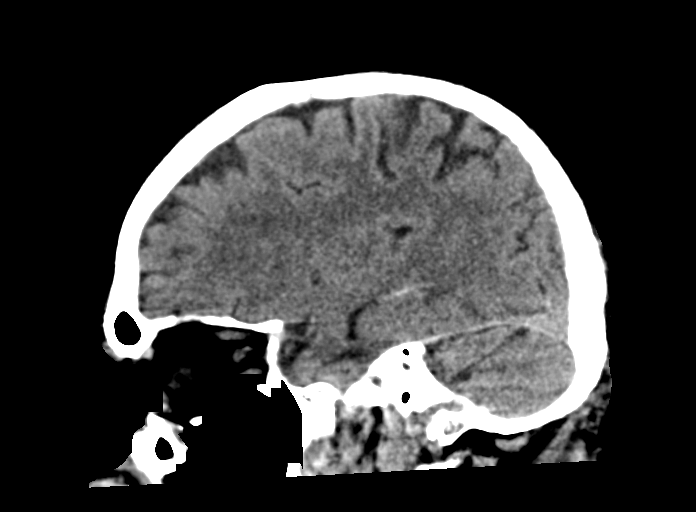
[im 34/67  brain]
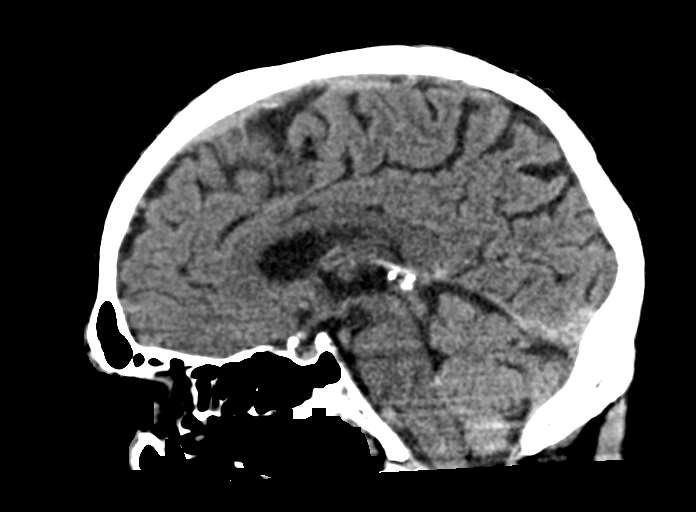
[im 45/67  brain]
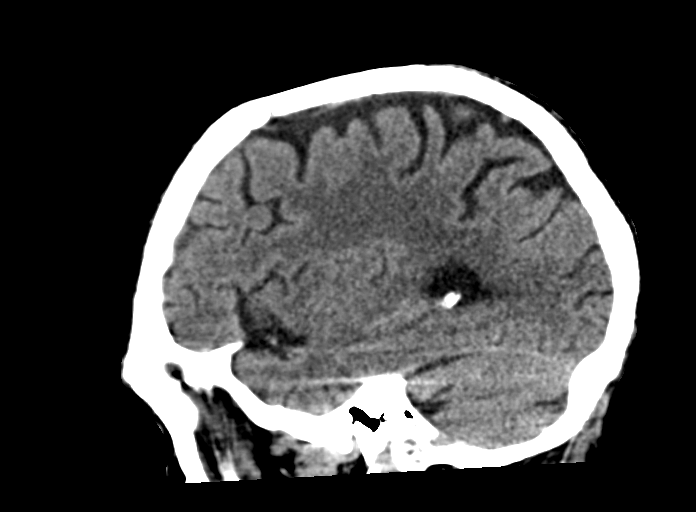

[15 of 47 positions shown; findings below may reference images not displayed]

FINDINGS: Brain: Mild cerebral atrophy. Patchy and confluent areas of
decreased attenuation are noted throughout the deep and
periventricular white matter of the cerebral hemispheres
bilaterally, compatible with chronic microvascular ischemic disease.
No evidence of acute infarction, hemorrhage, hydrocephalus,
extra-axial collection or mass lesion/mass effect.

Vascular: No hyperdense vessel or unexpected calcification.

Skull: Normal. Negative for fracture or focal lesion.

Sinuses/Orbits: Mild multifocal mucosal thickening in the ethmoid
sinuses. No acute finding.

Other: None.
IMPRESSION: 1. No acute intracranial abnormalities.
2. Mild cerebral atrophy with mild chronic microvascular ischemic
changes in the cerebral white matter.

## 2018-07-23 IMAGING — DX DG CHEST 2V
2 series · 2 of 2 positions shown · non-contrast
Comparison: Chest radiograph June 24, 2014

CLINICAL DATA: Altered mental status and weakness after extended
time outside.

EXAM:
CHEST  2 VIEW

[chest pa]
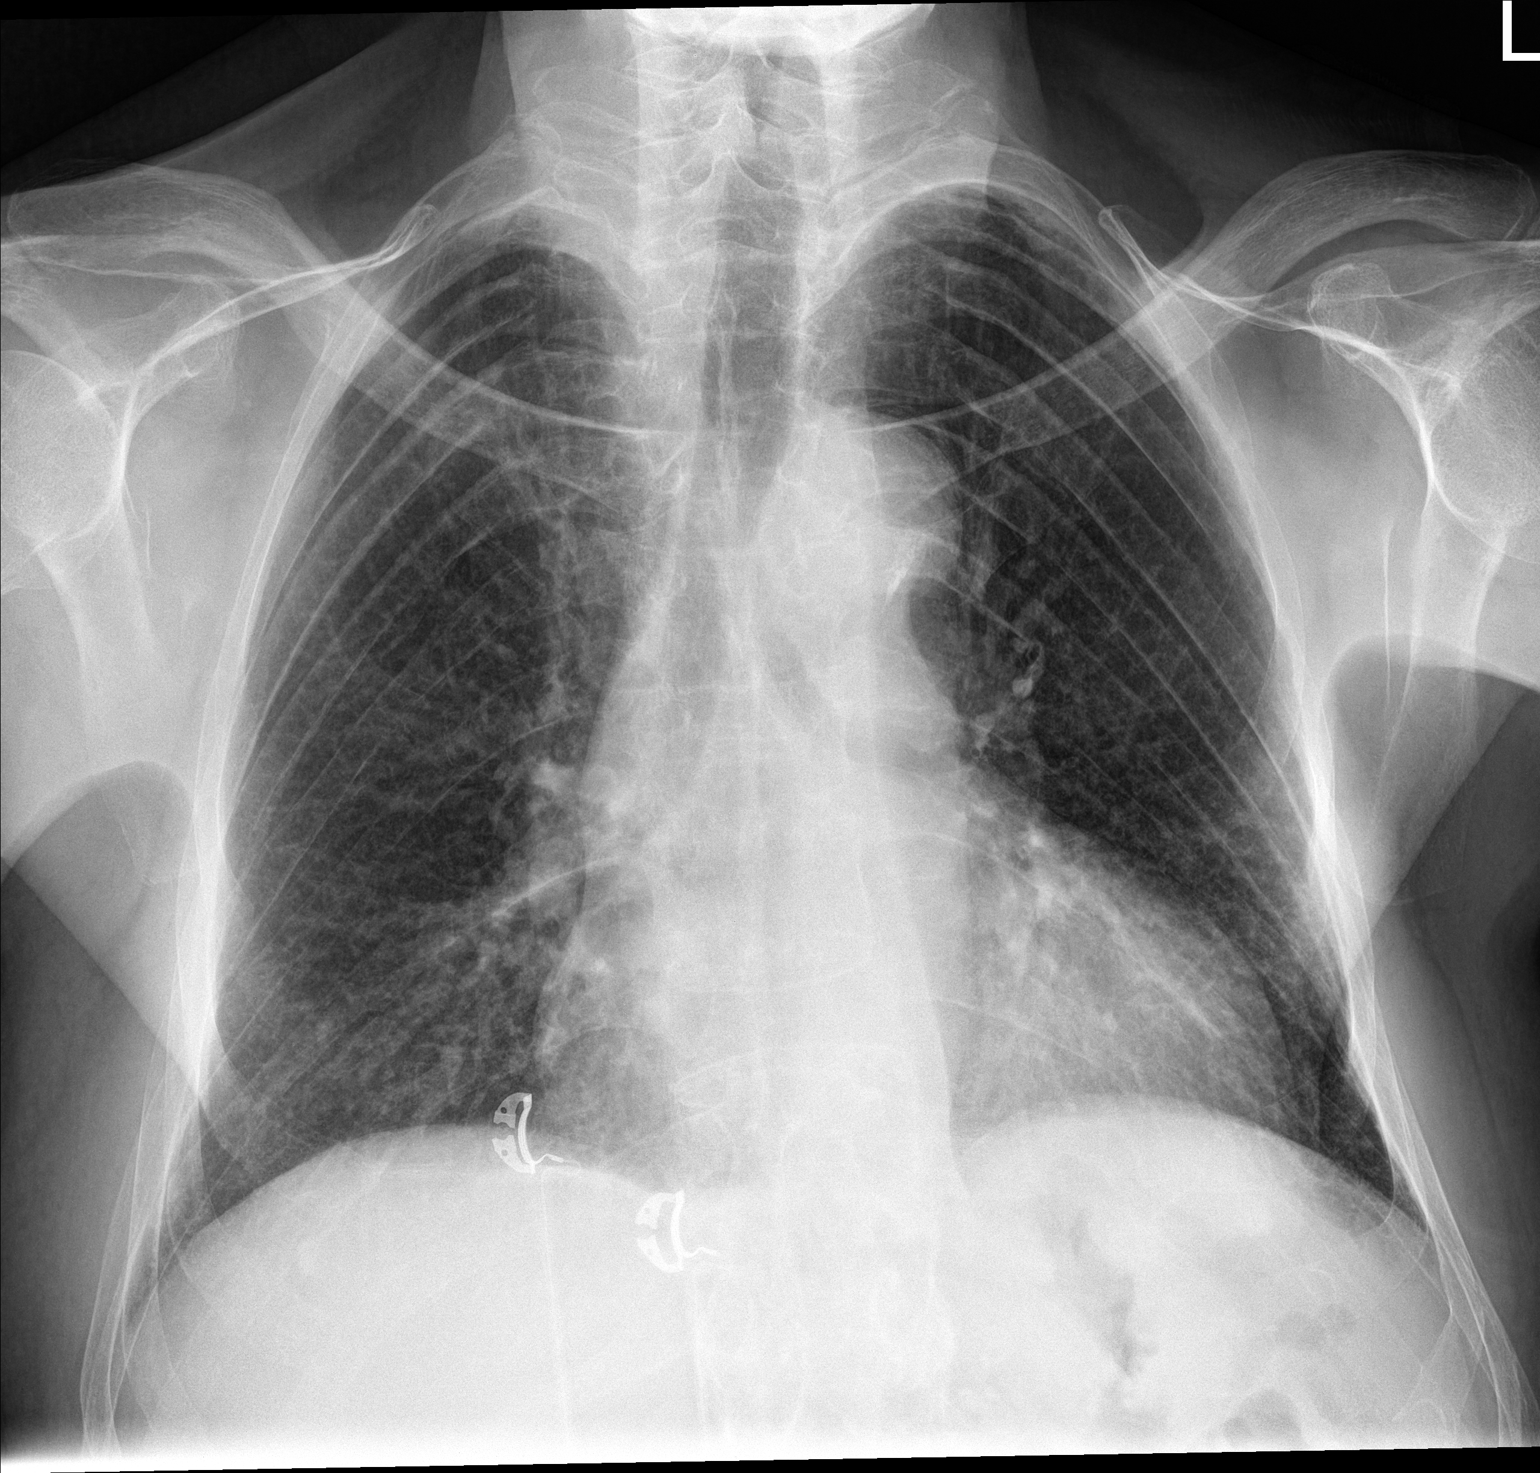

[chest lat]
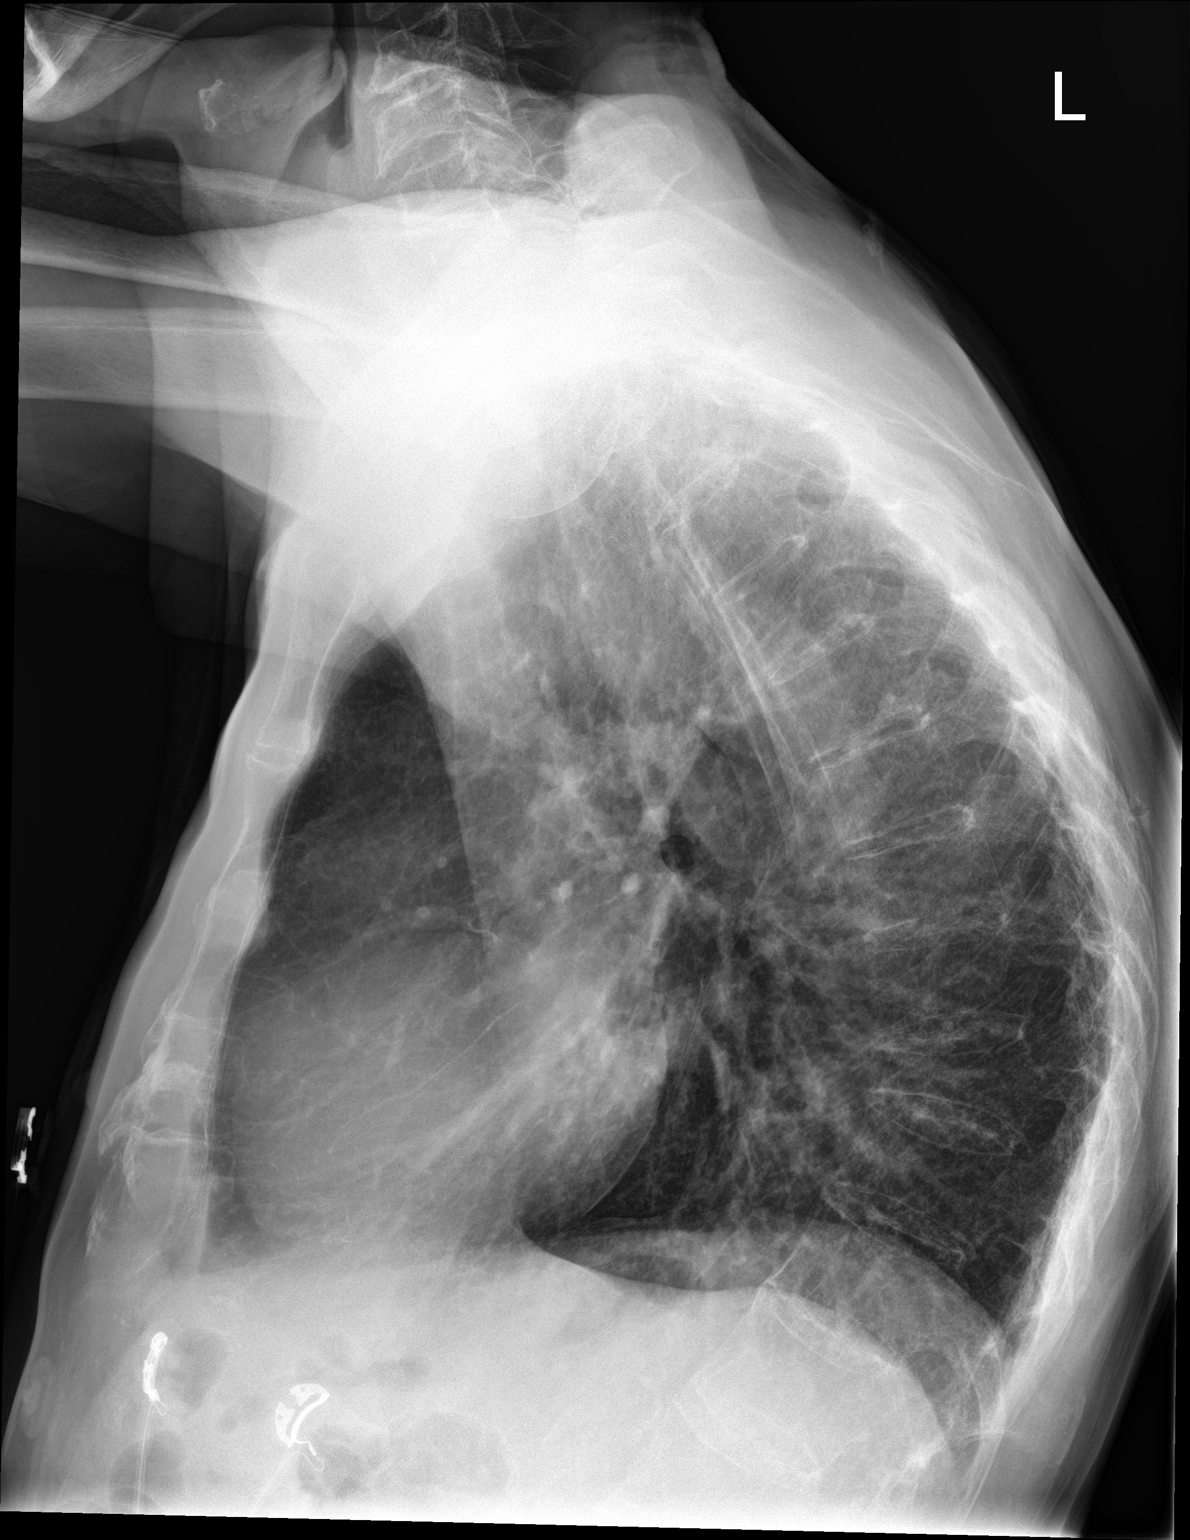

[2 of 2 positions shown; findings below may reference images not displayed]

FINDINGS: Cardiac silhouette is moderately enlarged. Calcified aortic knob.
Similar mild chronic interstitial changes without pleural effusion
or focal consolidation. Increased lung volumes. No pneumothorax.
Accentuated kyphosis, chronic approximate T10 compression fracture.
Osteopenia.
IMPRESSION: Stable cardiomegaly and COPD.

## 2018-07-24 IMAGING — CR DG CHEST 1V PORT
1 series · 1 of 1 positions shown · non-contrast
Comparison: 10/16/2016.

CLINICAL DATA: 83-year-old male with fever.  Subsequent encounter.

EXAM:
PORTABLE CHEST 1 VIEW

[portable]
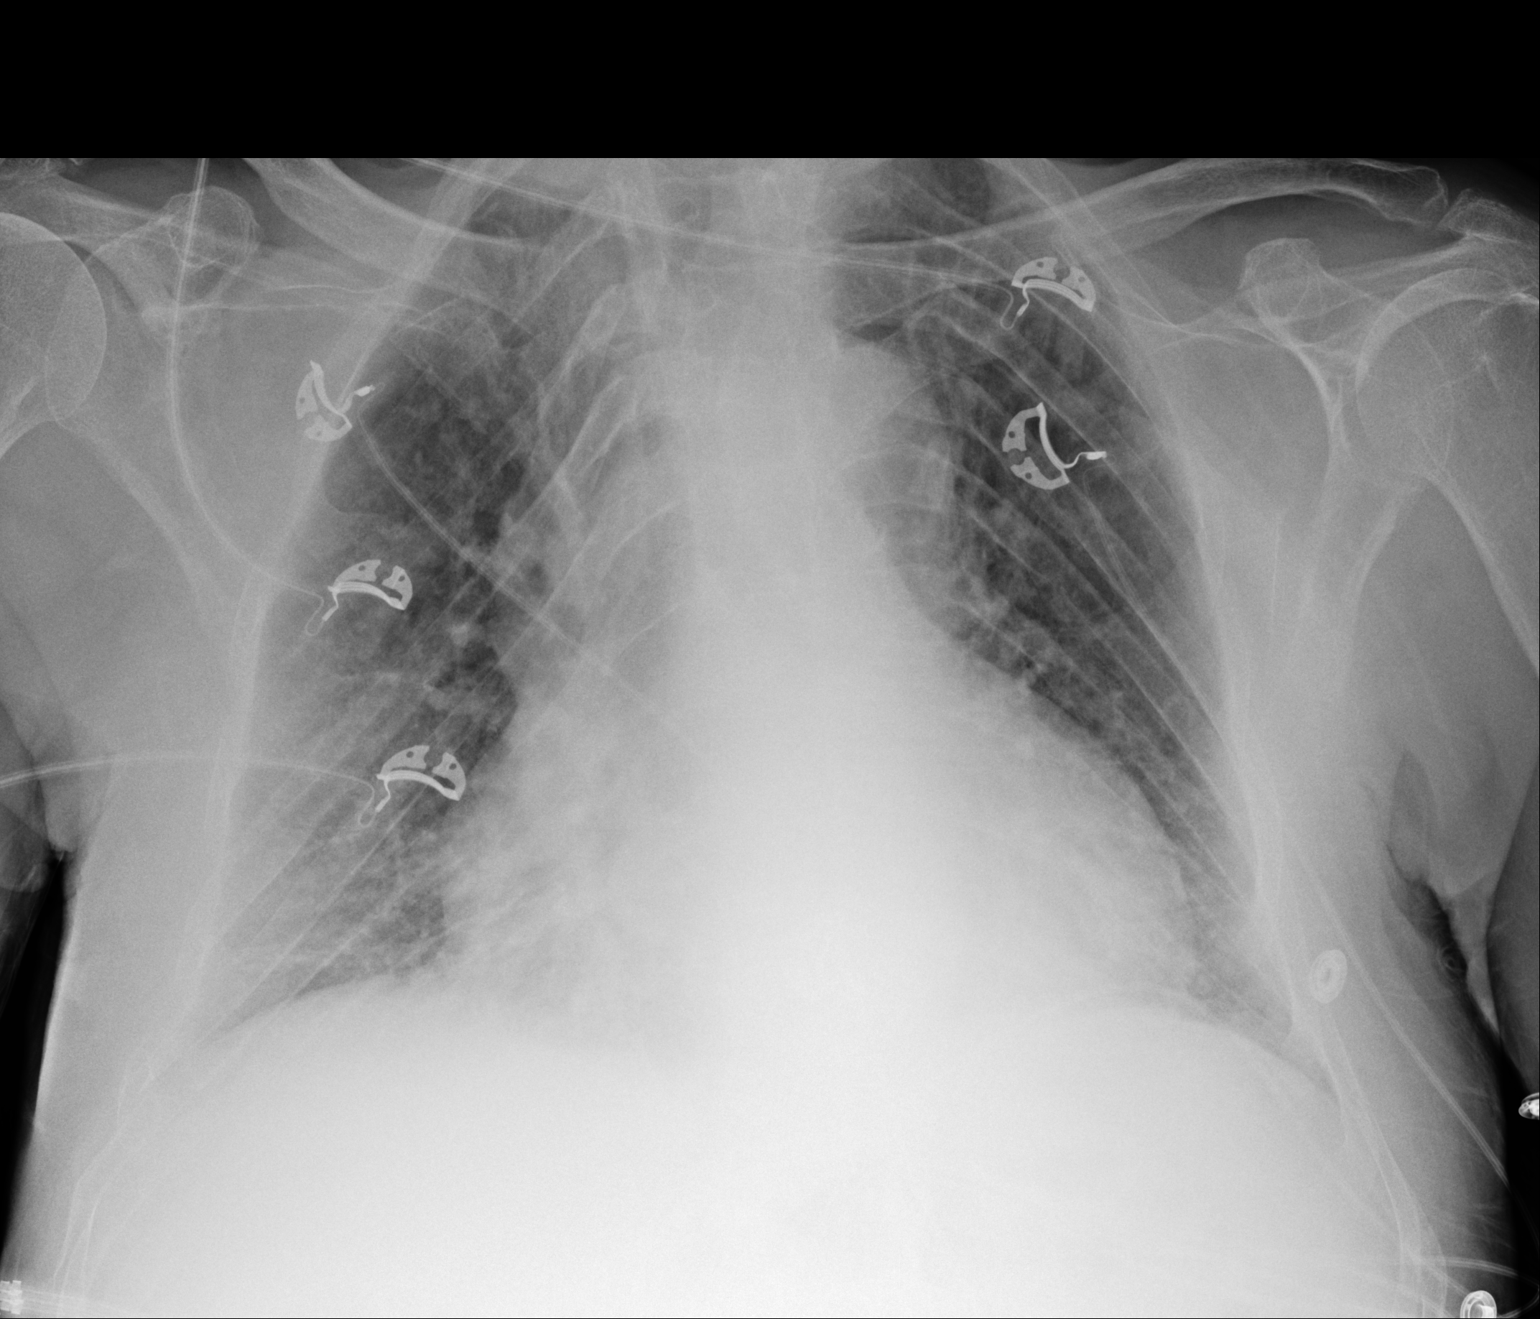

[1 of 1 positions shown; findings below may reference images not displayed]

FINDINGS: Cardiomegaly.

Pulmonary vascular congestion.

No segmental consolidation. Slightly limited evaluation of
retrocardiac region.

Calcified slightly tortuous aorta.
IMPRESSION: Cardiomegaly.

Pulmonary vascular congestion.

No segmental consolidation. Slightly limited evaluation of
retrocardiac region.

## 2018-07-29 IMAGING — CR DG CHEST 1V PORT
1 series · 1 of 1 positions shown · non-contrast
Comparison: 10/21/2016

CLINICAL DATA: Atelectasis follow-up

EXAM:
PORTABLE CHEST 1 VIEW

[portable]
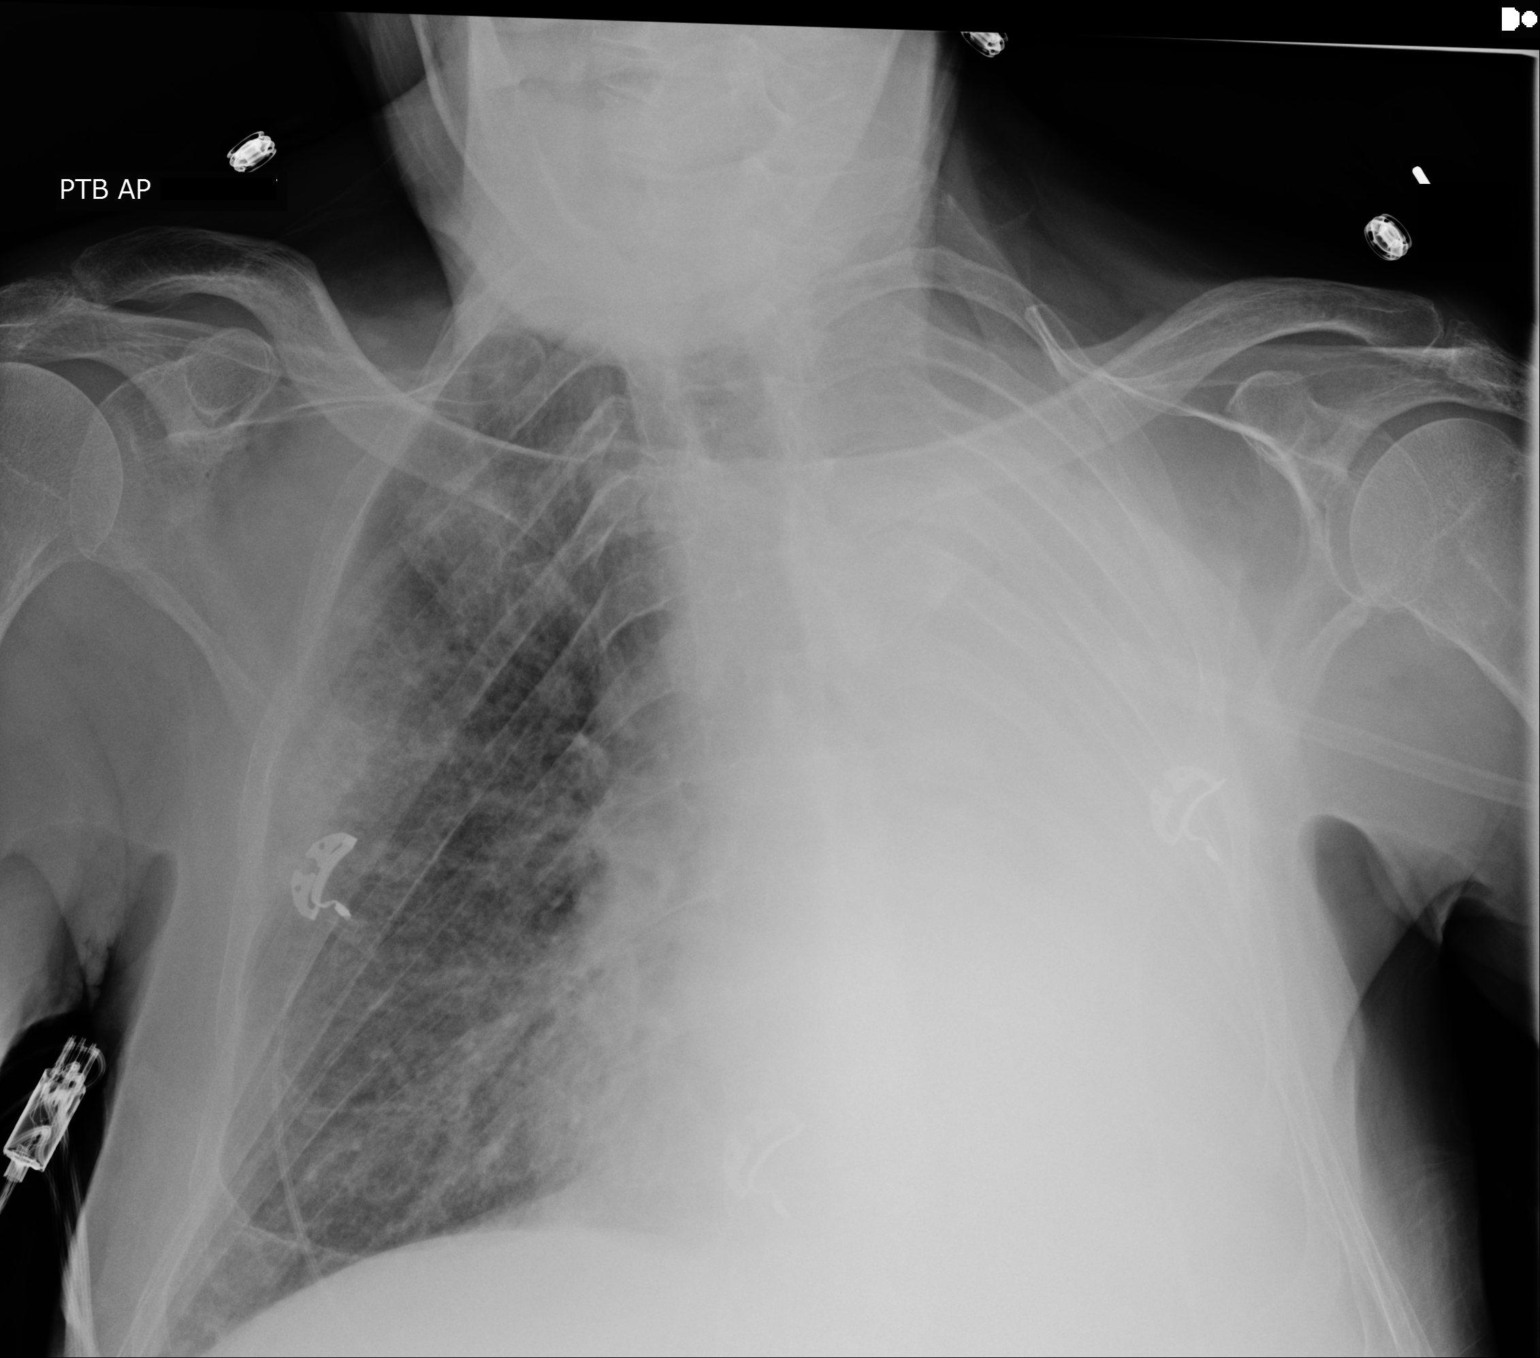

[1 of 1 positions shown; findings below may reference images not displayed]

FINDINGS: Progressive collapse of the left lung which is now completely
collapsed. Mild shift of the mediastinal structures to the left.

Diffuse airspace disease in the right may represent pneumonia or
edema and is unchanged.
IMPRESSION: Complete collapse left lung which has progressed

Diffuse right lung edema/pneumonia unchanged
